# Patient Record
Sex: Female | Born: 1950 | Race: White | Hispanic: No | Smoking: Never smoker
Health system: Southern US, Community
[De-identification: ages and names within clinical notes are randomized; demographics above are authoritative.]

## PROBLEM LIST (undated history)

## (undated) DIAGNOSIS — E119 Type 2 diabetes mellitus without complications: Secondary | ICD-10-CM

## (undated) DIAGNOSIS — I10 Essential (primary) hypertension: Secondary | ICD-10-CM

## (undated) DIAGNOSIS — E785 Hyperlipidemia, unspecified: Secondary | ICD-10-CM

## (undated) DIAGNOSIS — F32A Depression, unspecified: Secondary | ICD-10-CM

## (undated) DIAGNOSIS — Z862 Personal history of diseases of the blood and blood-forming organs and certain disorders involving the immune mechanism: Secondary | ICD-10-CM

## (undated) DIAGNOSIS — R51 Headache: Secondary | ICD-10-CM

## (undated) DIAGNOSIS — F329 Major depressive disorder, single episode, unspecified: Secondary | ICD-10-CM

## (undated) DIAGNOSIS — Z8619 Personal history of other infectious and parasitic diseases: Secondary | ICD-10-CM

## (undated) HISTORY — PX: BREAST REDUCTION SURGERY: SHX8

## (undated) HISTORY — DX: Personal history of other infectious and parasitic diseases: Z86.19

## (undated) HISTORY — PX: REDUCTION MAMMAPLASTY: SUR839

## (undated) HISTORY — PX: BUNIONECTOMY: SHX129

## (undated) HISTORY — PX: BREAST BIOPSY: SHX20

## (undated) HISTORY — PX: ABDOMINAL HYSTERECTOMY: SHX81

## (undated) HISTORY — DX: Type 2 diabetes mellitus without complications: E11.9

## (undated) HISTORY — PX: OTHER SURGICAL HISTORY: SHX169

## (undated) HISTORY — DX: Hyperlipidemia, unspecified: E78.5

## (undated) HISTORY — DX: Essential (primary) hypertension: I10

---

## 1974-11-13 HISTORY — PX: ABDOMINAL HYSTERECTOMY: SUR658

## 2011-08-07 LAB — CBC AND DIFFERENTIAL: Hemoglobin: 13.9 g/dL (ref 12.0–16.0)

## 2012-06-26 LAB — LIPID PANEL
HDL: 33 mg/dL — AB (ref 35–70)
LDL Cholesterol: 73 mg/dL

## 2012-06-26 LAB — BASIC METABOLIC PANEL: Creatinine: 0.8 mg/dL (ref 0.5–1.1)

## 2013-01-07 ENCOUNTER — Ambulatory Visit (INDEPENDENT_AMBULATORY_CARE_PROVIDER_SITE_OTHER): Payer: Self-pay | Admitting: Family Medicine

## 2013-01-07 ENCOUNTER — Encounter: Payer: Self-pay | Admitting: Family Medicine

## 2013-01-07 VITALS — BP 176/91 | HR 68 | Ht 68.5 in | Wt 221.0 lb

## 2013-01-07 DIAGNOSIS — I10 Essential (primary) hypertension: Secondary | ICD-10-CM

## 2013-01-07 DIAGNOSIS — Z8619 Personal history of other infectious and parasitic diseases: Secondary | ICD-10-CM

## 2013-01-07 DIAGNOSIS — E782 Mixed hyperlipidemia: Secondary | ICD-10-CM | POA: Insufficient documentation

## 2013-01-07 DIAGNOSIS — Z853 Personal history of malignant neoplasm of breast: Secondary | ICD-10-CM | POA: Insufficient documentation

## 2013-01-07 DIAGNOSIS — E785 Hyperlipidemia, unspecified: Secondary | ICD-10-CM

## 2013-01-07 DIAGNOSIS — F329 Major depressive disorder, single episode, unspecified: Secondary | ICD-10-CM | POA: Insufficient documentation

## 2013-01-07 HISTORY — DX: Essential (primary) hypertension: I10

## 2013-01-07 HISTORY — DX: Personal history of other infectious and parasitic diseases: Z86.19

## 2013-01-07 NOTE — Progress Notes (Signed)
CC: Kayla Gallagher is a 62 y.o. female is here for Establish Care   Subjective: HPI:   very pleasant 62 year old here to establish care. Formally from [place].  History hyperlipidemia: She's currently taking Zocor 40 mg on a daily basis. She is unsure of her most recent lipid panel results, believes it's been over a year. Denies right upper quadrant pain, myalgias, skin or scleral discoloration. Tries to watch what she eats, is trying to get back in exercise program.    history of hepatitis C treated in the late 90s. She's uncertain what exactly she was treated with.  She's not sure if her viral load has been checked anytime recently. She occasionally takes acetaminophen, no longer a drinker, no recreational drug use currently but did use IV drugs in the 70s. She denies bleeding issues or easy bruisability.   History of breast cancer found during breast reduction surgery. She's uncertain of the specifics beyond this. She was told she has a spot on her right breast that has been followed every 6 months with diagnostic mammograms. He does not some and it has been biopsied. She believes she is a few months overdue for mammogram. She denies architectural changes in the breast, breast pain, nipple complaints, nor swollen lymph nodes. Denies unintentional weight loss. Family history significant for sister with breast cancer  History of hypertension: Tells me that this is been an issue for a little over one year. She's been on metoprolol and lisinopril and was once on hydrochlorothiazide but decided to stop taking this on her own. No outside blood pressures to report.  Review of Systems - General ROS: negative for - chills, fever, night sweats, weight gain or weight loss Ophthalmic ROS: negative for - decreased vision Psychological ROS: negative for -  depression ENT ROS: negative for - hearing change, nasal congestion, tinnitus or allergies Hematological and Lymphatic ROS: negative for - bleeding  problems, bruising or swollen lymph nodes Breast ROS: negative Respiratory ROS: no cough, shortness of breath, or wheezing Cardiovascular ROS: no chest pain or dyspnea on exertion Gastrointestinal ROS: no abdominal pain, change in bowel habits, or black or bloody stools Genito-Urinary ROS: negative for - genital discharge, genital ulcers, incontinence or abnormal bleeding from genitals Musculoskeletal ROS: negative for - joint pain or muscle pain Neurological ROS: negative for - headaches or memory loss Dermatological ROS: negative for lumps, mole changes, rash and skin lesion changes  Past Medical History  Diagnosis Date  . Hypertension   . Hyperlipidemia   . History of breast cancer 01/07/2013  . Essential hypertension, benign 01/07/2013  . History of hepatitis C 01/07/2013     Family History  Problem Relation Age of Onset  . Alcoholism      parents  . Breast cancer Sister   . Prostate cancer      grandfather  . Heart attack Father   . Diabetes Father     grandmother  . Hyperlipidemia      grandfather     History  Substance Use Topics  . Smoking status: Never Smoker   . Smokeless tobacco: Not on file  . Alcohol Use: No     Objective: Filed Vitals:   01/07/13 1607  BP: 176/91  Pulse: 68    General: Alert and Oriented, No Acute Distress HEENT: Pupils equal, round, reactive to light. Conjunctivae clear.  External ears unremarkable, canals clear with intact TMs with appropriate landmarks.  Middle ear appears open without effusion. Pink inferior turbinates.  Moist mucous membranes,  pharynx without inflammation nor lesions.  Neck supple without palpable lymphadenopathy nor abnormal masses. Lungs: Clear to auscultation bilaterally, no wheezing/ronchi/rales.  Comfortable work of breathing. Good air movement. Cardiac: Regular rate and rhythm. Normal S1/S2.  No murmurs, rubs, nor gallops.   Abdomen: Normal bowel sounds, soft and non tender without palpable masses. Obese,  nonpalpable liver Extremities: No peripheral edema.  Strong peripheral pulses.  Mental Status: No depression, anxiety, nor agitation. Skin: Warm and dry.  Assessment & Plan: Kayla Gallagher was seen today for establish care.  Diagnoses and associated orders for this visit:  History of breast cancer - CBC w/Diff - MM Digital Diagnostic Bilat; Future  Hyperlipidemia - Lipid panel  History of hepatitis C - COMPLETE METABOLIC PANEL WITH GFR  Essential hypertension, benign  History of breast cancer: Overdue for diagnostic mammogram, this has been ordered. Hyperlipidemia: Clinically controlled however is due for lipid panel, will adjust cholesterol medications if indicated hypertension: Uncontrolled,  she will try to cut down on salt intake and agrees to a titration of medications if continued elevated in 2 weeks at followup. History of hepatitis C: Clinically stable, Patient declines viral load investigation, agreeable to serial liver function studies.  45 minutes spent face-to-face during visit today of which at least 50% was counseling or coordinating care regarding history of breast cancer, history of hep C, hyperlipidemia, hypertension.    Return in about 2 weeks (around 01/21/2013).

## 2013-01-16 ENCOUNTER — Encounter: Payer: Self-pay | Admitting: Family Medicine

## 2013-01-16 DIAGNOSIS — Z1501 Genetic susceptibility to malignant neoplasm of breast: Secondary | ICD-10-CM | POA: Insufficient documentation

## 2013-01-16 DIAGNOSIS — Z1509 Genetic susceptibility to other malignant neoplasm: Secondary | ICD-10-CM

## 2013-01-16 LAB — COMPLETE METABOLIC PANEL WITH GFR
ALT: 17 U/L (ref 0–35)
AST: 17 U/L (ref 0–37)
Albumin: 4.4 g/dL (ref 3.5–5.2)
Alkaline Phosphatase: 68 U/L (ref 39–117)
BUN: 16 mg/dL (ref 6–23)
Calcium: 9.8 mg/dL (ref 8.4–10.5)
Chloride: 99 mEq/L (ref 96–112)
Potassium: 4.8 mEq/L (ref 3.5–5.3)
Sodium: 134 mEq/L — ABNORMAL LOW (ref 135–145)
Total Protein: 7.6 g/dL (ref 6.0–8.3)

## 2013-01-16 LAB — CBC WITH DIFFERENTIAL/PLATELET
Basophils Absolute: 0 10*3/uL (ref 0.0–0.1)
Eosinophils Absolute: 0 10*3/uL (ref 0.0–0.7)
Eosinophils Relative: 1 % (ref 0–5)
HCT: 38.3 % (ref 36.0–46.0)
Lymphocytes Relative: 27 % (ref 12–46)
MCH: 29.5 pg (ref 26.0–34.0)
MCHC: 34.2 g/dL (ref 30.0–36.0)
MCV: 86.3 fL (ref 78.0–100.0)
Monocytes Absolute: 0.6 10*3/uL (ref 0.1–1.0)
Platelets: 251 10*3/uL (ref 150–400)
RDW: 13.6 % (ref 11.5–15.5)

## 2013-01-16 LAB — LIPID PANEL
HDL: 29 mg/dL — ABNORMAL LOW (ref >39)
LDL Cholesterol: 87 mg/dL (ref 0–99)

## 2013-01-20 ENCOUNTER — Telehealth: Payer: Self-pay | Admitting: Family Medicine

## 2013-01-20 ENCOUNTER — Encounter: Payer: Self-pay | Admitting: Family Medicine

## 2013-01-20 DIAGNOSIS — R739 Hyperglycemia, unspecified: Secondary | ICD-10-CM

## 2013-01-20 NOTE — Telephone Encounter (Signed)
Left message on vm with results and to call back with questions. Will fax labs downstairs

## 2013-01-20 NOTE — Telephone Encounter (Signed)
Kayla Gallagher, Will you please let Kayla Gallagher know that her liver function and cholesterol looks great.  No need to make any adjstments to her medication regimen at this time.  Her blood sugar was elevated and I'd encourage her to have an A1c drawn sooner than later to look into the possibility of type 2 diabetes.  I've printed a lab ticket that she can take to the lab to have this done at her convienence (placed in you inbox).

## 2013-01-22 ENCOUNTER — Encounter: Payer: Self-pay | Admitting: Family Medicine

## 2013-01-22 ENCOUNTER — Encounter: Payer: Self-pay | Admitting: *Deleted

## 2013-01-22 DIAGNOSIS — I10 Essential (primary) hypertension: Secondary | ICD-10-CM

## 2013-01-22 DIAGNOSIS — K635 Polyp of colon: Secondary | ICD-10-CM | POA: Insufficient documentation

## 2013-02-05 ENCOUNTER — Telehealth: Payer: Self-pay | Admitting: Family Medicine

## 2013-02-05 DIAGNOSIS — Z853 Personal history of malignant neoplasm of breast: Secondary | ICD-10-CM

## 2013-02-05 NOTE — Telephone Encounter (Signed)
I spoke with pt and she states they have not contacted her to schedule u/s. She said they were trying to get records from last imaging she had done and they told her once they compared the reports/films they would let her know if she needed an u/s

## 2013-02-05 NOTE — Telephone Encounter (Signed)
Sue Lush, Can you please do me a favor and ask Mrs. Kayla Gallagher if anyone from [facility] has contacted her about a bilateral breast ultrasound.  I got a report saying the radiologist wanted this to better characterize the tissue in both breasts.  I want to make sure this gets done in case [facility] has not already scheduled it.

## 2013-02-05 NOTE — Telephone Encounter (Signed)
Bryn Gulling placed the [facility] contact info and report in your inbox.  Can you please call them with a verbal order for a bilateral breast ultrasound for diagnosis of bilateral breast masses with history of breast cancer.

## 2013-02-06 ENCOUNTER — Telehealth: Payer: Self-pay | Admitting: *Deleted

## 2013-02-06 ENCOUNTER — Encounter: Payer: BC Managed Care – PPO | Admitting: Family Medicine

## 2013-02-06 DIAGNOSIS — Z0289 Encounter for other administrative examinations: Secondary | ICD-10-CM

## 2013-02-06 NOTE — Telephone Encounter (Signed)
I have called and scheduled an U/S for this pt at [facility] for 4/1 @ 845. I left a detailed message on pt's vm.will fax order to [phone]

## 2013-02-20 LAB — HEMOGLOBIN A1C
Hgb A1c MFr Bld: 8 % — ABNORMAL HIGH (ref <5.7)
Mean Plasma Glucose: 183 mg/dL — ABNORMAL HIGH (ref <117)

## 2013-02-21 ENCOUNTER — Telehealth: Payer: Self-pay | Admitting: Family Medicine

## 2013-02-21 DIAGNOSIS — E119 Type 2 diabetes mellitus without complications: Secondary | ICD-10-CM | POA: Insufficient documentation

## 2013-02-21 DIAGNOSIS — Z853 Personal history of malignant neoplasm of breast: Secondary | ICD-10-CM

## 2013-02-21 DIAGNOSIS — N6310 Unspecified lump in the right breast, unspecified quadrant: Secondary | ICD-10-CM | POA: Insufficient documentation

## 2013-02-21 NOTE — Telephone Encounter (Signed)
Pt notified of results and pt does have an appt already on mon for U/S of rt breast

## 2013-02-21 NOTE — Telephone Encounter (Addendum)
Sue Lush, Will you please let Kayla Gallagher know that her average sugar level over the past three months was in the type 2 diabetic range.  I'd like her to follow up with me to discuss medication options and lifestyle recommendations to help better control her blood sugar. Also, she may already know this, but the ultrasound radiologist has recommended that she have an ultrasound guided biopsy of the right breast.  I'm curious to see if they have arranged this for her, I'm going to assume that they have not therefore I will place a referral for this.  Let me know if this is already arranged and I'll hold off.

## 2013-02-21 NOTE — Telephone Encounter (Addendum)
Left message to call back  

## 2013-02-24 ENCOUNTER — Encounter: Payer: Self-pay | Admitting: *Deleted

## 2013-03-04 ENCOUNTER — Encounter: Payer: Self-pay | Admitting: Family Medicine

## 2013-03-04 ENCOUNTER — Encounter: Payer: BC Managed Care – PPO | Admitting: Family Medicine

## 2013-03-11 DIAGNOSIS — Z0289 Encounter for other administrative examinations: Secondary | ICD-10-CM

## 2013-03-12 ENCOUNTER — Ambulatory Visit: Payer: BC Managed Care – PPO | Admitting: Family Medicine

## 2013-03-13 ENCOUNTER — Telehealth: Payer: Self-pay | Admitting: Family Medicine

## 2013-03-13 DIAGNOSIS — Z853 Personal history of malignant neoplasm of breast: Secondary | ICD-10-CM

## 2013-03-13 NOTE — Telephone Encounter (Signed)
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 Left message on voicemail that I was hoping to ensure she was getting followupand feedback from the breast center, encouraged her to call us back at her convenience with updates

## 2013-03-14 ENCOUNTER — Telehealth: Payer: Self-pay | Admitting: Family Medicine

## 2013-03-14 DIAGNOSIS — Z853 Personal history of malignant neoplasm of breast: Secondary | ICD-10-CM

## 2013-03-14 DIAGNOSIS — E119 Type 2 diabetes mellitus without complications: Secondary | ICD-10-CM

## 2013-03-14 MED ORDER — METFORMIN HCL 1000 MG PO TABS: 1000.0000 mg | ORAL_TABLET | Freq: Every day | ORAL | Status: DC

## 2013-03-14 NOTE — Telephone Encounter (Signed)
Patient confirms that she was contacted about benign biopsy, needing annual mammos.  Also will start metformin daily, readdress at [facility] in coming week.

## 2013-03-19 ENCOUNTER — Encounter: Payer: Self-pay | Admitting: Family Medicine

## 2013-03-25 ENCOUNTER — Encounter: Payer: BC Managed Care – PPO | Admitting: Family Medicine

## 2013-03-28 ENCOUNTER — Encounter: Payer: Self-pay | Admitting: Family Medicine

## 2013-04-04 ENCOUNTER — Telehealth: Payer: Self-pay | Admitting: Family Medicine

## 2013-04-04 MED ORDER — EST ESTROGENS-METHYLTEST 1.25-2.5 MG PO TABS: 1.0000 | ORAL_TABLET | Freq: Every day | ORAL | Status: DC

## 2013-04-04 NOTE — Telephone Encounter (Signed)
Refill request

## 2013-04-16 ENCOUNTER — Ambulatory Visit (INDEPENDENT_AMBULATORY_CARE_PROVIDER_SITE_OTHER): Payer: BC Managed Care – PPO | Admitting: Family Medicine

## 2013-04-16 ENCOUNTER — Encounter: Payer: Self-pay | Admitting: Family Medicine

## 2013-04-16 VITALS — BP 144/79 | HR 67 | Ht 68.5 in | Wt 217.0 lb

## 2013-04-16 DIAGNOSIS — Z0000 Encounter for general adult medical examination without abnormal findings: Secondary | ICD-10-CM

## 2013-04-16 DIAGNOSIS — E119 Type 2 diabetes mellitus without complications: Secondary | ICD-10-CM

## 2013-04-16 DIAGNOSIS — L570 Actinic keratosis: Secondary | ICD-10-CM | POA: Insufficient documentation

## 2013-04-16 LAB — BASIC METABOLIC PANEL WITH GFR
CO2: 27 mEq/L (ref 19–32)
Chloride: 100 mEq/L (ref 96–112)
Creat: 0.84 mg/dL (ref 0.50–1.10)
Potassium: 4.7 mEq/L (ref 3.5–5.3)

## 2013-04-16 NOTE — Progress Notes (Signed)
CC: Kayla Gallagher is a 62 y.o. female is here for Annual Exam   Subjective: HPI:  Colonoscopy: 09/2011 Repeat 2015 Papsmear: 03/2012 Hx hysterectomy for only endometriosis and normal last pap, no longer needed.  Mammogram: Spring 2014: repeat October 2014   DEXA: History of normal BMD will repeat age 63  Influenza Vaccine: out of season Pneumovax: patient declines despite discussion of benefits Td/Tdap: patient declines but is due, despite discussion of benefits Zoster: patient declines despite discussion about the  Patient has no acute complaints today  Over the past 2 weeks have you been bothered by: - Little interest or pleasure in doing things: no - Feeling down depressed or hopeless: no  She is starting to work out more and watch what she eats ever since diagnosis of type 2 diabetes, successful intentional weight loss 2-4 pounds since I saw her last  Denies alcohol, tobacco, nor recreational drug use  Review of Systems - General ROS: negative for - chills, fever, night sweats, weight gain or weight loss Ophthalmic ROS: negative for - decreased vision Psychological ROS: negative for - anxiety or depression ENT ROS: negative for - hearing change, nasal congestion, tinnitus or allergies Hematological and Lymphatic ROS: negative for - bleeding problems, bruising or swollen lymph nodes Breast ROS: negative Respiratory ROS: no cough, shortness of breath, or wheezing Cardiovascular ROS: no chest pain or dyspnea on exertion Gastrointestinal ROS: no abdominal pain, change in bowel habits, or black or bloody stools Genito-Urinary ROS: negative for - genital discharge, genital ulcers, incontinence or abnormal bleeding from genitals Musculoskeletal ROS: negative for - joint pain or muscle pain Neurological ROS: negative for - headaches or memory loss Dermatological ROS: negative for lumps, mole changes, rash and skin lesion changes  Past Medical History  Diagnosis Date  .  Hypertension   . Hyperlipidemia   . History of breast cancer 01/07/2013  . Essential hypertension, benign 01/07/2013  . History of hepatitis C 01/07/2013     Family History  Problem Relation Age of Onset  . Alcoholism      parents  . Breast cancer Sister   . Prostate cancer      grandfather  . Heart attack Father   . Diabetes Father     grandmother  . Hyperlipidemia      grandfather     History  Substance Use Topics  . Smoking status: Never Smoker   . Smokeless tobacco: Not on file  . Alcohol Use: No     Objective: Filed Vitals:   04/16/13 0833  BP: 144/79  Pulse: 67   General: No Acute Distress HEENT: Atraumatic, normocephalic, conjunctivae normal without scleral icterus.  No nasal discharge, hearing grossly intact, TMs with good landmarks bilaterally with no middle ear abnormalities, posterior pharynx clear without oral lesions. Neck: Supple, trachea midline, no cervical nor supraclavicular adenopathy. Pulmonary: Clear to auscultation bilaterally without wheezing, rhonchi, nor rales. Cardiac: Regular rate and rhythm.  No murmurs, rubs, nor gallops. No peripheral edema.  2+ peripheral pulses bilaterally. Abdomen: Bowel sounds normal.  No masses.  Non-tender without rebound.  Negative Murphy's sign. GU: Patient declines  MSK: Grossly intact, no signs of weakness.  Full strength throughout upper and lower extremities.  Full ROM in upper and lower extremities.  No midline spinal tenderness. Neuro: Gait unremarkable, CN II-XII grossly intact.  C5-C6 Reflex 2/4 Bilaterally, L4 Reflex 2/4 Bilaterally.  Cerebellar function intact. Skin: She has an irritated actinic keratosis on the right posterior scalp line as well as at the  centimeter actinic keratosis on the right sternal border, seborrheic keratosis on left arm not inflamed Psych: Alert and oriented to person/place/time.  Thought process normal. No anxiety/depression.   Assessment & Plan: Kayla Gallagher was seen today for annual  exam.  Diagnoses and associated orders for this visit:  Physical exam  Type 2 diabetes mellitus - BASIC METABOLIC PANEL WITH GFR  Actinic keratosis    Healthy lifestyle interventions including but limited to regular exercise, a healthy low fat diet, moderation of salt intake, the dangers of tobacco/alcohol/recreational drug use, nutrition supplementation, and accident avoidance were discussed with the patient and a handout was provided for future reference.  Type 2 diabetes: Will check fasting blood sugar today an A1c in 2 months, LDL is at goal already on Zocor. She's taking an ACE inhibitor I've asked her to return at her convenience or she can wait 2 diabetes followup to have cryotherapy to 2 actinic keratosis  Return in about 4 weeks (around 05/14/2013).

## 2013-04-16 NOTE — Patient Instructions (Signed)
Actinic Keratosis Actinic keratosis is a precancerous growth on the skin. This means it could develop into skin cancer if it is not treated. About 1% of actinic keratoses turn into skin cancer within a year. It is important to have all such growths removed to prevent them from developing into skin cancer. CAUSES  Actinic keratosis is caused by getting too much ultraviolet (UV) radiation from the sun or other UV light sources. RISK FACTORS Factors that increase your chances of getting actinic keratosis include:  Having light-colored skin and blue eyes.  Having blonde or red hair.  Spending a lot of time in the sun.  Age. The risk of actinic keratosis increases with age. SYMPTOMS  Actinic keratosis growths look like scaly, rough spots of skin. They can be as small as a pinhead or as big as a quarter. They may itch, hurt, or feel sensitive. Sometimes there is a little tag of pink or gray skin growing off them. In some cases, actinic keratoses are easier felt than seen. They do not go away with the use of moisturizing lotions or creams. Actinic keratoses appear most often on areas of skin that get a lot of sun exposure. These areas include the:  Scalp.  Face.  Ears.  Lips.  Upper back.  Backs of the hands.  Forearms. DIAGNOSIS  Your caregiver can usually tell what is wrong by performing a physical exam. A tissue sample (biopsy) may also be taken and examined under a microscope. TREATMENT  Actinic keratosis can be treated several ways. Most treatments can be done in your caregiver's office. Treatment options may include:  Curettage. A tool is used to gently scrape off the growth.  Cryosurgery. Liquid nitrogen is applied to the growth to freeze it. The growth eventually falls off the skin.  Medicated creams, such as 5-fluorouracil or imiquimod. The medicine destroys the cells in the growth.  Chemical peels. Chemicals are applied to the growth and the outer layers of skin are peeled  off.  Photodynamic therapy. A drug that makes your skin more sensitive to light is applied to the skin. A strong, blue light is aimed at the skin and destroys the growth. PREVENTION  To prevent future sun damage:  Try to avoid the sun between 10:00 a.m. and 4:00 p.m. when it is the strongest.  Use a sunscreen or sunblock with SPF 30 or greater.  Apply sunscreen at least 30 minutes before exposure to the sun.  Always wear protective hats, clothing, and sunglasses with UV protection.  Avoid medicines, herbs, and foods that increase your sensitivity to sunlight.  Avoid tanning beds. HOME CARE INSTRUCTIONS   If your skin was covered with a bandage, change and remove the bandage as directed by your caregiver.  Keep the treated area dry as directed by your caregiver.  Apply any creams as prescribed by your caregiver. Follow the directions carefully.  Check your skin regularly for any changes.  Visit a skin doctor (dermatologist) every year for a skin exam. SEEK MEDICAL CARE IF:   Your skin does not heal and becomes irritated, red, or bleeds.  You notice any changes or new growths on your skin. Document Released: 01/26/2009 Document Revised: 01/22/2012 Document Reviewed: 12/11/2011 ExitCare Patient Information 2014 ExitCare  Dr. Genelle Bal General Advice Following Your Complete Physical Exam  The Benefits of Regular Exercise: Unless you suffer from an uncontrolled cardiovascular condition, studies strongly suggest that regular exercise and physical activity will add to both the quality and length of your life.  The World Health Organization recommends 150 minutes of moderate intensity aerobic activity every week.  This is best split over 3-4 days a week, and can be as simple as a brisk walk for just over 35 minutes "most days of the week".  This type of exercise has been shown to lower LDL-Cholesterol, lower average blood sugars, lower blood pressure, lower cardiovascular disease risk,  improve memory, and increase one's overall sense of wellbeing.  The addition of anaerobic (or "strength training") exercises offers additional benefits including but not limited to increased metabolism, prevention of osteoporosis, and improved overall cholesterol levels.  How Can I Strive For A Low-Fat Diet?: Current guidelines recommend that 25-35 percent of your daily energy (food) intake should come from fats.  One might ask how can this be achieved without having to dissect each meal on a daily basis?  Switch to skim or 1% milk instead of whole milk.  Focus on lean meats such as ground [place], fresh fish, baked chicken, and lean cuts of beef as your source of dietary protein.  Consume less than 300mg /day of dietary cholesterol.  Limit trans fatty acid consumption primarily by limiting synthetic trans fats such as partially hydrogenated oils (Ex: fried fast foods).  Focus efforts on reducing your intake of "solid" fats (Ex: Butter).  Substitute olive or vegetable oil for solid fats where possible.  Moderation of Salt Intake: Provided you don't carry a diagnosis of congestive heart failure nor renal failure, I recommend a daily allowance of no more than 2300 mg of salt (sodium).  Keeping under this daily goal is associated with a decreased risk of cardiovascular events, creeping above it can lead to elevated blood pressures and increases your risk of cardiovascular events.  Milligrams (mg) of salt is listed on all nutrition labels, and your daily intake can add up faster than you think.  Most canned and frozen dinners can pack in over half your daily salt allowance in one meal.    Lifestyle Health Risks: Certain lifestyle choices carry specific health risks.  As you may already know, tobacco use has been associated with increasing one's risk of cardiovascular disease, pulmonary disease, numerous cancers, among many other issues.  What you may not know is that there are medications and nicotine  replacement strategies that can more than double your chances of successfully quitting.  I would be thrilled to help manage your quitting strategy if you currently use tobacco products.  When it comes to alcohol use, I've yet to find an "ideal" daily allowance.  Provided an individual does not have a medical condition that is exacerbated by alcohol consumption, general guidelines determine "safe drinking" as no more than two standard drinks for a man or no more than one standard drink for a female per day.  However, much debate still exists on whether any amount of alcohol consumption is technically "safe".  My general advice, keep alcohol consumption to a minimum for general health promotion.  If you or others believe that alcohol, tobacco, or recreational drug use is interfering with your life, I would be happy to provide confidential counseling regarding treatment options.  General "Over The Counter" Nutrition Advice: Postmenopausal women should aim for a daily calcium intake of 1200 mg, however a significant portion of this might already be provided by diets including milk, yogurt, cheese, and other dairy products.  Vitamin D has been shown to help preserve bone density, prevent fatigue, and has even been shown to help reduce falls in the elderly.  Ensuring a daily intake of 800 Units of Vitamin D is a good place to start to enjoy the above benefits, we can easily check your Vitamin D level to see if you'd potentially benefit from supplementation beyond 800 Units a day.  Folic Acid intake should be of particular concern to women of childbearing age.  Daily consumption of 400-800 mcg of Folic Acid is recommended to minimize the chance of spinal cord defects in a fetus should pregnancy occur.    For many adults, accidents still remain one of the most common culprits when it comes to cause of death.  Some of the simplest but most effective preventitive habits you can adopt include regular seatbelt use, proper  helmet use, securing firearms, and regularly testing your smoke and carbon monoxide detectors.  Karaline Buresh B. [facility] DO Med [facility] [address], [address] [place], [place] [address] Phone: [phone]

## 2013-04-17 ENCOUNTER — Telehealth: Payer: Self-pay | Admitting: Family Medicine

## 2013-04-17 DIAGNOSIS — E119 Type 2 diabetes mellitus without complications: Secondary | ICD-10-CM

## 2013-04-17 MED ORDER — METFORMIN HCL 1000 MG PO TABS: 1000.0000 mg | ORAL_TABLET | Freq: Two times a day (BID) | ORAL | Status: DC

## 2013-04-17 NOTE — Telephone Encounter (Signed)
Sue Lush, Will you please let mrs. Aitken know that her fasting blood sugar has not budged one bit since checking three months ago.  I'd like her to increase her metformin to 1000mg  twice a day, she can consider breaking the tablet in half at the time of her new dosing for a few days before going to the full tablet to avoid loose stools.  We'll recheck this in a month.

## 2013-04-18 NOTE — Telephone Encounter (Signed)
Left message for patient to call back  

## 2013-04-21 NOTE — Telephone Encounter (Signed)
Patient advised.

## 2013-05-27 ENCOUNTER — Encounter: Payer: Self-pay | Admitting: Family Medicine

## 2013-05-27 ENCOUNTER — Ambulatory Visit (INDEPENDENT_AMBULATORY_CARE_PROVIDER_SITE_OTHER): Payer: BC Managed Care – PPO | Admitting: Family Medicine

## 2013-05-27 VITALS — BP 161/95 | HR 87 | Temp 98.0°F | Wt 218.0 lb

## 2013-05-27 DIAGNOSIS — I10 Essential (primary) hypertension: Secondary | ICD-10-CM

## 2013-05-27 DIAGNOSIS — L0390 Cellulitis, unspecified: Secondary | ICD-10-CM

## 2013-05-27 DIAGNOSIS — E119 Type 2 diabetes mellitus without complications: Secondary | ICD-10-CM

## 2013-05-27 LAB — HEMOGLOBIN A1C
Hgb A1c MFr Bld: 7.4 % — ABNORMAL HIGH (ref <5.7)
Mean Plasma Glucose: 166 mg/dL — ABNORMAL HIGH (ref <117)

## 2013-05-27 MED ORDER — SULFAMETHOXAZOLE-TRIMETHOPRIM 800-160 MG PO TABS: ORAL_TABLET | ORAL | Status: AC

## 2013-05-27 MED ORDER — METOPROLOL SUCCINATE ER 100 MG PO TB24: 100.0000 mg | ORAL_TABLET | Freq: Every day | ORAL | Status: DC

## 2013-05-27 NOTE — Progress Notes (Signed)
CC: Kayla Gallagher is a 62 y.o. female is here for Cellulitis   Subjective: HPI:  Patient complains of a rash on the chin that began Sunday night it is enlarging ever since onset, slowly. Nothing seems to make it better or worse she has started leftover Keflex 500 mg 3 times a day starting Sunday night did notice a new lesion this morning. Lesions are slightly tender discharge is scant described as clear. She denies skin lesions elsewhere. Denies fevers, chills, local trauma, dysphagia, sore throat, swollen lymph nodes, rapid heartbeat nor shortness of breath.  Followup essential hypertension: She tells me she has run out of metoprolol has not been using it for the last 2 weeks. Before and since denies chest pain, limb claudication, headache, motor sensory disturbances.  Followup type 2 diabetes: Since her last visit she is on full dose metformin without missed doses. She denies polyuria polyphagia polydipsia she does have a poorly healing wound on her chin but denies skin rashes elsewhere lesions nor burning or tingling of extremities denies vision loss.     Review Of Systems Outlined In HPI  Past Medical History  Diagnosis Date  . Hypertension   . Hyperlipidemia   . History of breast cancer 01/07/2013  . Essential hypertension, benign 01/07/2013  . History of hepatitis C 01/07/2013     Family History  Problem Relation Age of Onset  . Alcoholism      parents  . Breast cancer Sister   . Prostate cancer      grandfather  . Heart attack Father   . Diabetes Father     grandmother  . Hyperlipidemia      grandfather     History  Substance Use Topics  . Smoking status: Never Smoker   . Smokeless tobacco: Not on file  . Alcohol Use: No     Objective: Filed Vitals:   05/27/13 0822  BP: 161/95  Pulse: 87  Temp: 98 F (36.7 C)    General: Alert and Oriented, No Acute Distress HEENT: Pupils equal, round, reactive to light. Conjunctivae clear.  Pink inferior turbinates.  Moist  mucous membranes, pharynx without inflammation nor lesions.  Neck supple  with shotty right anterior cervical chain lymphadenopathy nor abnormal masses. the chin has 3 shallow abrasions with mild surrounding erythema  each of which are approximately 1-1/2 cm diameter  Lungs: Clear to auscultation bilaterally, no wheezing/ronchi/rales.  Comfortable work of breathing. Good air movement. Cardiac: Regular rate and rhythm. Normal S1/S2.  No murmurs, rubs, nor gallops.   Extremities: No peripheral edema.  Strong peripheral pulses.  Mental Status: No depression, anxiety, nor agitation. Skin: Warm and dry.  Assessment & Plan: Kayla Gallagher was seen today for cellulitis.  Diagnoses and associated orders for this visit:  Type 2 diabetes mellitus - Hemoglobin A1c  Essential hypertension, benign - metoprolol succinate (TOPROL-XL) 100 MG 24 hr tablet; Take 1 tablet (100 mg total) by mouth daily. Take with or immediately following a meal.  Cellulitis - sulfamethoxazole-trimethoprim (SEPTRA DS) 800-160 MG per tablet; One by mouth twice a day for ten days.    Type 2 diabetes: She is due for an A1c to determine control Essential hypertension: Chronic uncontrolled condition exacerbated due to stopping metoprolol, she is in agreement of restarting metoprolol Cellulits: suspect staph vs strep infection therefore start septra.   Return in about 3 months (around 08/27/2013). or if symptoms fail to improve.

## 2013-06-09 ENCOUNTER — Telehealth: Payer: Self-pay | Admitting: *Deleted

## 2013-06-09 DIAGNOSIS — E559 Vitamin D deficiency, unspecified: Secondary | ICD-10-CM

## 2013-06-09 NOTE — Telephone Encounter (Signed)
Pt calls and states she has had low vitamin d levels in the past and would like this checked again as she is starting to feel the same way as before when it was low. Barry Dienes, LPN

## 2013-06-09 NOTE — Telephone Encounter (Signed)
Ok for vitamin D recheck. Send slip to lab.

## 2013-06-10 NOTE — Telephone Encounter (Signed)
Pt notified lab slip sent to lab. Barry Dienes, LPN

## 2013-06-11 LAB — VITAMIN D 25 HYDROXY (VIT D DEFICIENCY, FRACTURES): Vit D, 25-Hydroxy: 27 ng/mL — ABNORMAL LOW (ref 30–89)

## 2013-06-17 ENCOUNTER — Encounter: Payer: Self-pay | Admitting: Family Medicine

## 2013-06-17 ENCOUNTER — Ambulatory Visit (INDEPENDENT_AMBULATORY_CARE_PROVIDER_SITE_OTHER): Payer: BC Managed Care – PPO | Admitting: Family Medicine

## 2013-06-17 VITALS — BP 164/82 | HR 72 | Temp 98.1°F | Wt 218.0 lb

## 2013-06-17 DIAGNOSIS — R5383 Other fatigue: Secondary | ICD-10-CM

## 2013-06-17 DIAGNOSIS — R5381 Other malaise: Secondary | ICD-10-CM

## 2013-06-17 DIAGNOSIS — R51 Headache: Secondary | ICD-10-CM

## 2013-06-17 LAB — COMPLETE METABOLIC PANEL WITH GFR
ALT: 16 U/L (ref 0–35)
AST: 18 U/L (ref 0–37)
Albumin: 4.2 g/dL (ref 3.5–5.2)
Calcium: 9.8 mg/dL (ref 8.4–10.5)
Chloride: 100 mEq/L (ref 96–112)
Potassium: 5 mEq/L (ref 3.5–5.3)

## 2013-06-17 LAB — CBC
HCT: 38.4 % (ref 36.0–46.0)
Platelets: 240 10*3/uL (ref 150–400)
RBC: 4.45 MIL/uL (ref 3.87–5.11)
RDW: 13.7 % (ref 11.5–15.5)
WBC: 8.3 10*3/uL (ref 4.0–10.5)

## 2013-06-17 LAB — SEDIMENTATION RATE: Sed Rate: 27 mm/hr — ABNORMAL HIGH (ref 0–22)

## 2013-06-17 LAB — C-REACTIVE PROTEIN: CRP: 1.1 mg/dL — ABNORMAL HIGH (ref <0.60)

## 2013-06-17 LAB — VITAMIN B12: Vitamin B-12: 274 pg/mL (ref 211–911)

## 2013-06-17 NOTE — Progress Notes (Signed)
CC: Kayla Gallagher is a 62 y.o. female is here for Dizziness   Subjective: HPI:  Patient complains of fatigue and headache: This is been present for 2-3 weeks present on a daily basis fatigue is described as debilitating slightly improved with sleeping more than 10 hours describes feel asleep anytime of the day that she wants describes nonrestorative sleep. She's unsure about snoring. Symptoms worsening since onset. She admits to muscular fatigue in shoulders nowhere else.  Her headache is described as a pressure on the right forehead radiating to the right temple, moderate in severity comes and goes throughout the day slightly improved while on Bactrim for cellulitis nothing else makes better or worse overall Slowly worsening since onset 3 weeks ago. Denies motor or sensory disturbances, photophobia, phonophobia, rashes, neck pain, confusion, nausea. She is unable to describe character beyond above  Denies fevers, chills, chest pain, irregular heartbeat, shortness of breath   Review Of Systems Outlined In HPI  Past Medical History  Diagnosis Date  . Hypertension   . Hyperlipidemia   . History of breast cancer 01/07/2013  . Essential hypertension, benign 01/07/2013  . History of hepatitis C 01/07/2013     Family History  Problem Relation Age of Onset  . Alcoholism      parents  . Breast cancer Sister   . Prostate cancer      grandfather  . Heart attack Father   . Diabetes Father     grandmother  . Hyperlipidemia      grandfather     History  Substance Use Topics  . Smoking status: Never Smoker   . Smokeless tobacco: Not on file  . Alcohol Use: No     Objective: Filed Vitals:   06/17/13 0950  BP: 164/82  Pulse: 72  Temp: 98.1 F (36.7 C)    General: Alert and Oriented, No Acute Distress HEENT: Pupils equal, round, reactive to light. Conjunctivae clear.  External ears unremarkable, canals clear with intact TMs with appropriate landmarks.  Middle ear appears open without  effusion. Pink inferior turbinates.  Moist mucous membranes, pharynx without inflammation nor lesions.  Neck supple without palpable lymphadenopathy nor abnormal masses. Cranial nerves II through XII grossly intact Lungs: Clear to auscultation bilaterally, no wheezing/ronchi/rales.  Comfortable work of breathing. Good air movement. Cardiac: Regular rate and rhythm. Normal S1/S2.  No murmurs, rubs, nor gallops.  No carotid bruits Extremities: No peripheral edema.  Strong peripheral pulses. Full range of motion and strength of bilateral upper extremities Mental Status: No depression, anxiety, nor agitation. Skin: Warm and dry.  Assessment & Plan: Kayla Gallagher was seen today for dizziness.  Diagnoses and associated orders for this visit:  Fatigue - TSH - B12 - CBC - COMPLETE METABOLIC PANEL WITH GFR  Headache(784.0) - Sed Rate (ESR) - C-reactive protein    Fatigue: Will rule out reversible causes and metabolic abnormalities above, if normal would like to consider sleep study versus CT sinuses Headache: Will rule out temporal arteritis we'll consider above sleep study versus CT sinus  Return if symptoms worsen or fail to improve.

## 2013-06-18 ENCOUNTER — Telehealth: Payer: Self-pay | Admitting: Family Medicine

## 2013-06-18 DIAGNOSIS — M316 Other giant cell arteritis: Secondary | ICD-10-CM | POA: Insufficient documentation

## 2013-06-18 DIAGNOSIS — R51 Headache: Secondary | ICD-10-CM

## 2013-06-18 MED ORDER — PREDNISONE 20 MG PO TABS: ORAL_TABLET | ORAL | Status: DC

## 2013-06-18 NOTE — Telephone Encounter (Signed)
Pt.notified

## 2013-06-18 NOTE — Telephone Encounter (Signed)
Sue Lush, Will you please let [name] know that her lab work raises my suspicion for temporal arteritis, I'd like her to start on 40mg  of prednisone daily which I've sent to her [facility].  I've also put in vascular specialist referral to help rule in or rule out this diagnosis and duration of prednisone, fortunately if her headache is just from her sinuses this should also help.  I'd encourage her to f/u with me in 2 weeks.

## 2013-06-24 ENCOUNTER — Encounter (HOSPITAL_COMMUNITY): Payer: Self-pay | Admitting: Pharmacy Technician

## 2013-06-24 ENCOUNTER — Ambulatory Visit (INDEPENDENT_AMBULATORY_CARE_PROVIDER_SITE_OTHER): Payer: BC Managed Care – PPO | Admitting: Vascular Surgery

## 2013-06-24 ENCOUNTER — Encounter: Payer: Self-pay | Admitting: Vascular Surgery

## 2013-06-24 ENCOUNTER — Other Ambulatory Visit: Payer: Self-pay | Admitting: *Deleted

## 2013-06-24 VITALS — BP 163/82 | HR 66 | Ht 68.5 in | Wt 220.8 lb

## 2013-06-24 DIAGNOSIS — M316 Other giant cell arteritis: Secondary | ICD-10-CM

## 2013-06-24 NOTE — Progress Notes (Signed)
[facility] [place] HISTORY AND PHYSICAL   CC:  Possible temoral arteritis  Hommel, Sean, DO  HPI: This is a 62 y.o. female who is referred from her PCP for possible temporal arteritis in need of temporal artery bx.  She states that she has had dizziness that has been going on for about 4-5 weeks and she was started on prednisone and this did improve somewhat, but was still dizzy in the morning.  She has complained of HA, but this also improved with the steroids.  She states thate she has had excessive sweating with little exertion as well.  She says that she had some abnormal labs and her PCP office called and wanted her referred for bx.   She did have a failed stress test in 2013 followed by cardiac catheterization, which she states was normal.  She also has a hx of diabetes that is treated with oral medication.  She also has hypertension treated with anti hypertensives.  Past Medical History  Diagnosis Date  . Hypertension   . Hyperlipidemia   . History of breast cancer 01/07/2013  . Essential hypertension, benign 01/07/2013  . History of hepatitis C 01/07/2013  . Diabetes mellitus without complication    Past Surgical History  Procedure Laterality Date  . Abdominal hysterectomy  1976  . Breast reduction surgery    . Abdominal hysterectomy    . Tummy tuck      No Known Allergies  Current Outpatient Prescriptions  Medication Sig Dispense Refill  . estrogen-methylTESTOSTERone (ESTRATEST) 1.25-2.5 MG per tablet Take 1 tablet by mouth daily.  90 tablet  11  . lisinopril (PRINIVIL,ZESTRIL) 40 MG tablet Take 40 mg by mouth daily.      . metFORMIN (GLUCOPHAGE) 1000 MG tablet Take 1 tablet (1,000 mg total) by mouth 2 (two) times daily.  60 tablet  3  . metoprolol succinate (TOPROL-XL) 100 MG 24 hr tablet Take 1 tablet (100 mg total) by mouth daily. Take with or immediately following a meal.  90 tablet  3  . PARoxetine (PAXIL) 20 MG tablet Take 20 mg by mouth every  morning.      . predniSONE (DELTASONE) 20 MG tablet Two by mouth every morning.  60 tablet  0  . simvastatin (ZOCOR) 40 MG tablet Take 40 mg by mouth every evening.       No current facility-administered medications for this visit.    Family History  Problem Relation Age of Onset  . Alcoholism      parents  . Breast cancer Sister   . Prostate cancer      grandfather  . Heart attack Father   . Diabetes Father     grandmother  . Cancer Father   . Heart disease Father   . Hyperlipidemia Father   . Hypertension Father   . Hyperlipidemia      grandfather  . Cancer Mother     History   Social History  . Marital Status: Divorced    Spouse Name: N/A    Number of Children: N/A  . Years of Education: N/A   Occupational History  . Not on file.   Social History Main Topics  . Smoking status: Never Smoker   . Smokeless tobacco: Not on file  . Alcohol Use: No  . Drug Use: No  . Sexually Active: Not Currently   Other Topics Concern  . Not on file   Social History Narrative  . No narrative on file  ROS: [x]  Positive   [ ]  Negative   [ ]  All sytems reviewed and are negative  Cardiovascular: []  chest pain/pressure []  palpitations []  SOB lying flat []  DOE []  pain in legs while walking []  pain in feet when lying flat []  hx of DVT []  hx of phlebitis []  swelling in legs []  varicose veins  Pulmonary: []  productive cough []  asthma []  wheezing  Neurologic: []  weakness in []  arms []  legs []  numbness in []  arms []  legs [] difficulty speaking or slurred speech []  temporary loss of vision in one eye [x]  dizziness-see HPI [x]  headache-see HPI  Hematologic: []  bleeding problems []  problems with blood clotting easily  GI []  vomiting blood []  blood in stool  GU: []  burning with urination []  blood in urine  Psychiatric: [x]  hx of major depression-clinical.  She has moved, changed jobs and lost her dad in the past 3 months.  Integumentary: []  rashes []   ulcers  Constitutional: []  fever []  chills   PHYSICAL EXAMINATION:  Filed Vitals:   06/24/13 1511  BP: 163/82  Pulse: 66   Body mass index is 33.08 kg/(m^2).  General:  WDWN in NAD Gait: Not observed HENT: WNL, normocephalic Eyes: Pupils equal Pulmonary: normal non-labored breathing , without Rales, rhonchi,  wheezing Cardiac: RRR, without  Murmurs, rubs or gallops; without carotid bruits Abdomen: soft, NT, no masses Skin: without rashes, without ulcers  Vascular Exam/Pulses:+ palpable radial pulses bilaterally Extremities: without ischemic changes, without Gangrene , without cellulitis; without open wounds;  Musculoskeletal: no muscle wasting or atrophy  Neurologic: A&O X 3; Appropriate Affect ; SENSATION: normal; MOTOR FUNCTION:  moving all extremities equally. Speech is fluent/normal   Non-Invasive Vascular Imaging:  none  Sed rate 27 CRP 1.1  ASSESSMENT/PLAN: 62 y.o. female with possible temporal arteritis with slightly increased sed rate and CRP  -for right temporal artery biopsy June 30, 2013    [facility] [phone]  Clinic MD:  Pt seen and examined in conjunction with Dr. Arbie Cookey  I have examined the patient, reviewed and agree with above. I discussed our role in temporal artery biopsy for better ability to diagnose temporal arteritis. Explain the procedure including an outpatient procedure with local with IV sedation. We'll proceed with this on 8/18 at her convenience  EARLY, TODD, MD 06/24/2013 5:14 PM

## 2013-06-27 ENCOUNTER — Encounter (HOSPITAL_COMMUNITY): Payer: Self-pay | Admitting: *Deleted

## 2013-06-27 NOTE — Progress Notes (Signed)
06/27/13 1409  OBSTRUCTIVE SLEEP APNEA  Have you ever been diagnosed with sleep apnea through a sleep study? No  Do you snore loudly (loud enough to be heard through closed doors)?  1  Do you often feel tired, fatigued, or sleepy during the daytime? 1  Has anyone observed you stop breathing during your sleep? 0  Do you have, or are you being treated for high blood pressure? 1  BMI more than 35 kg/m2? 0  Age over 63 years old? 1  Neck circumference greater than 40 cm/18 inches? 0  Gender: 0  Obstructive Sleep Apnea Score 4  Score 4 or greater  Results sent to PCP

## 2013-06-27 NOTE — Progress Notes (Signed)
Anesthesia Note:  Patient is a 61 year old female scheduled for temporal artery biopsy on 06/30/13 by Dr. Early.  Patient is scheduled to be a same day work-up.  Phone interview by Todd, RN was done this afternoon.  She reported a history of an abnormal stress test last year followed by a "normal" cardiac cath in [place], [place]. I was told that [facility] was going to fax the cath report, but minutes before 1700 was notified that they could not locate a cath report.     Other history includes non-smoker, HTN, HLD, hepatitis C, anemia, depression, hysterectomy, breast reduction surgery, question of breast cancer (in PCP notes, but patient denied). PCP is Dr. Sean Hommel.    Nuclear stress test (scanned in Epic) from 03/11/12 showed: 1) No EKG evidence of ischemia. 2) Technically difficult study because of breast attenuation artifact and excessive subdiaphragmatic visceral uptake. 3) Reduced radiotracer count during stress images and inferolateral, lateral and anterior wall concerning for ischemia and multivessel distribution.  This is somewhat technically difficult study. 4) LVEF 69% with no wall motion abnormality.    Other diagnostic studies are pending the day of surgery.  Unfortunately, since patient is a same day work up and it is now after 5:00 pm we are unable to obtain any additional records until 06/30/13.  Patient is pretty certain that the cath was done at [facility] despite them saying they do not have record of a cath. The office of her prior PCP Dr. Nabil Tadros was also contacted and they did not have a copy of a cath either.    I have updated anesthesiologist Dr. Fitzgerald.  Nursing staff will need to re-attempt to obtain further cardiology records on Monday.  She will also be evaluated by anesthesia and preoperative diagnostic studies reviewed.  If there are any concerns from her anesthesiologist and records are still pending then it is possible  she will need to be moved to Dr. Early's last case to allow more time to receive records.  This will be determined by her anesthesiologist after evaluation.  Emmamarie Kluender, PA-C [facility]/Anesthesiology Phone [phone] 06/27/2013 5:10 PM  

## 2013-06-27 NOTE — Progress Notes (Signed)
Patient reports that she had an abnormal stress test in 2013 followed by a clean cardiac cath. Revonda Standard notified requested cardiac cath from [facility] in [place], [place]

## 2013-06-28 NOTE — Progress Notes (Signed)
Attempted to contact Dr. Arbie Cookey regarding concerns from [place], [place] and being unable to get stress test. Did not get return call. Spoke to Dr. Ivin Booty about patient d/t now being first case he advised no need at further attempt to contact Dr. Arbie Cookey that patient could be evaluated DOS

## 2013-06-29 MED ORDER — CEFUROXIME SODIUM 1.5 G IJ SOLR
1.5000 g | INTRAMUSCULAR | Status: AC
  Administered 2013-06-30: 1.5 g via INTRAVENOUS
  Filled 2013-06-29: qty 1.5

## 2013-06-30 ENCOUNTER — Ambulatory Visit (HOSPITAL_COMMUNITY): Payer: BC Managed Care – PPO

## 2013-06-30 ENCOUNTER — Ambulatory Visit (HOSPITAL_COMMUNITY): Payer: BC Managed Care – PPO | Admitting: Vascular Surgery

## 2013-06-30 ENCOUNTER — Encounter (HOSPITAL_COMMUNITY)
Admission: RE | Disposition: A | Discharge status: Home / Self Care | Payer: Self-pay | Source: Ambulatory Visit | Attending: Vascular Surgery

## 2013-06-30 ENCOUNTER — Ambulatory Visit (HOSPITAL_COMMUNITY)
Admission: RE | Admit: 2013-06-30 | Discharge: 2013-06-30 | Disposition: A | Discharge status: Home / Self Care | Payer: BC Managed Care – PPO | Source: Ambulatory Visit | Attending: Vascular Surgery | Admitting: Vascular Surgery

## 2013-06-30 ENCOUNTER — Encounter (HOSPITAL_COMMUNITY): Payer: Self-pay | Admitting: *Deleted

## 2013-06-30 ENCOUNTER — Encounter (HOSPITAL_COMMUNITY): Payer: Self-pay | Admitting: Vascular Surgery

## 2013-06-30 DIAGNOSIS — R51 Headache: Secondary | ICD-10-CM

## 2013-06-30 DIAGNOSIS — B1920 Unspecified viral hepatitis C without hepatic coma: Secondary | ICD-10-CM | POA: Insufficient documentation

## 2013-06-30 DIAGNOSIS — F329 Major depressive disorder, single episode, unspecified: Secondary | ICD-10-CM | POA: Insufficient documentation

## 2013-06-30 DIAGNOSIS — F17200 Nicotine dependence, unspecified, uncomplicated: Secondary | ICD-10-CM | POA: Insufficient documentation

## 2013-06-30 DIAGNOSIS — I10 Essential (primary) hypertension: Secondary | ICD-10-CM | POA: Insufficient documentation

## 2013-06-30 DIAGNOSIS — E7800 Pure hypercholesterolemia, unspecified: Secondary | ICD-10-CM | POA: Insufficient documentation

## 2013-06-30 DIAGNOSIS — D649 Anemia, unspecified: Secondary | ICD-10-CM | POA: Insufficient documentation

## 2013-06-30 DIAGNOSIS — F3289 Other specified depressive episodes: Secondary | ICD-10-CM | POA: Insufficient documentation

## 2013-06-30 DIAGNOSIS — M316 Other giant cell arteritis: Secondary | ICD-10-CM

## 2013-06-30 HISTORY — DX: Headache: R51

## 2013-06-30 HISTORY — DX: Depression, unspecified: F32.A

## 2013-06-30 HISTORY — DX: Major depressive disorder, single episode, unspecified: F32.9

## 2013-06-30 HISTORY — DX: Personal history of diseases of the blood and blood-forming organs and certain disorders involving the immune mechanism: Z86.2

## 2013-06-30 HISTORY — PX: ARTERY BIOPSY: SHX891

## 2013-06-30 LAB — COMPREHENSIVE METABOLIC PANEL
ALT: 14 U/L (ref 0–35)
Alkaline Phosphatase: 52 U/L (ref 39–117)
BUN: 24 mg/dL — ABNORMAL HIGH (ref 6–23)
CO2: 27 mEq/L (ref 19–32)
Chloride: 101 mEq/L (ref 96–112)
GFR calc Af Amer: >90 mL/min (ref >90)
Glucose, Bld: 140 mg/dL — ABNORMAL HIGH (ref 70–99)
Potassium: 4.2 mEq/L (ref 3.5–5.1)
Sodium: 137 mEq/L (ref 135–145)
Total Bilirubin: 0.3 mg/dL (ref 0.3–1.2)
Total Protein: 7.1 g/dL (ref 6.0–8.3)

## 2013-06-30 LAB — CBC
HCT: 38.7 % (ref 36.0–46.0)
Hemoglobin: 13.5 g/dL (ref 12.0–15.0)
MCHC: 34.9 g/dL (ref 30.0–36.0)
RBC: 4.44 MIL/uL (ref 3.87–5.11)
WBC: 15.4 10*3/uL — ABNORMAL HIGH (ref 4.0–10.5)

## 2013-06-30 LAB — GLUCOSE, CAPILLARY: Glucose-Capillary: 134 mg/dL — ABNORMAL HIGH (ref 70–99)

## 2013-06-30 SURGERY — BIOPSY TEMPORAL ARTERY
Anesthesia: Monitor Anesthesia Care | Site: Head | Laterality: Right | Wound class: Clean

## 2013-06-30 MED ORDER — OXYCODONE HCL 5 MG/5ML PO SOLN: 5.0000 mg | Freq: Once | ORAL | Status: DC | PRN

## 2013-06-30 MED ORDER — OXYCODONE HCL 5 MG PO TABS: 5.0000 mg | ORAL_TABLET | Freq: Once | ORAL | Status: DC | PRN

## 2013-06-30 MED ORDER — LIDOCAINE-EPINEPHRINE 0.5 %-1:200000 IJ SOLN
INTRAMUSCULAR | Status: AC
  Filled 2013-06-30: qty 1

## 2013-06-30 MED ORDER — PROMETHAZINE HCL 25 MG/ML IJ SOLN: 6.2500 mg | INTRAMUSCULAR | Status: DC | PRN

## 2013-06-30 MED ORDER — LACTATED RINGERS IV SOLN
INTRAVENOUS | Status: DC | PRN
  Administered 2013-06-30: 07:00:00 via INTRAVENOUS

## 2013-06-30 MED ORDER — METOPROLOL SUCCINATE ER 100 MG PO TB24
100.0000 mg | ORAL_TABLET | Freq: Every day | ORAL | Status: DC
  Administered 2013-06-30: 100 mg via ORAL
  Filled 2013-06-30 (×2): qty 1

## 2013-06-30 MED ORDER — FENTANYL CITRATE 0.05 MG/ML IJ SOLN
INTRAMUSCULAR | Status: DC | PRN
  Administered 2013-06-30: 50 ug via INTRAVENOUS

## 2013-06-30 MED ORDER — MIDAZOLAM HCL 2 MG/2ML IJ SOLN: 0.5000 mg | Freq: Once | INTRAMUSCULAR | Status: DC | PRN

## 2013-06-30 MED ORDER — PROPOFOL INFUSION 10 MG/ML OPTIME
INTRAVENOUS | Status: DC | PRN
  Administered 2013-06-30: 75 ug/kg/min via INTRAVENOUS

## 2013-06-30 MED ORDER — ONDANSETRON HCL 4 MG/2ML IJ SOLN
INTRAMUSCULAR | Status: DC | PRN
  Administered 2013-06-30: 4 mg via INTRAVENOUS

## 2013-06-30 MED ORDER — LIDOCAINE HCL (CARDIAC) 20 MG/ML IV SOLN
INTRAVENOUS | Status: DC | PRN
  Administered 2013-06-30: 40 mg via INTRAVENOUS

## 2013-06-30 MED ORDER — LIDOCAINE-EPINEPHRINE 0.5 %-1:200000 IJ SOLN
INTRAMUSCULAR | Status: DC | PRN
  Administered 2013-06-30: 50 mL

## 2013-06-30 MED ORDER — IBUPROFEN 600 MG PO TABS
600.0000 mg | ORAL_TABLET | Freq: Four times a day (QID) | ORAL | Status: AC
  Administered 2013-06-30: 600 mg via ORAL
  Filled 2013-06-30 (×2): qty 1

## 2013-06-30 MED ORDER — MEPERIDINE HCL 25 MG/ML IJ SOLN: 6.2500 mg | INTRAMUSCULAR | Status: DC | PRN

## 2013-06-30 MED ORDER — 0.9 % SODIUM CHLORIDE (POUR BTL) OPTIME
TOPICAL | Status: DC | PRN
  Administered 2013-06-30: 1000 mL

## 2013-06-30 MED ORDER — SODIUM CHLORIDE 0.9 % IV SOLN: INTRAVENOUS | Status: DC

## 2013-06-30 MED ORDER — FENTANYL CITRATE 0.05 MG/ML IJ SOLN: 25.0000 ug | INTRAMUSCULAR | Status: DC | PRN

## 2013-06-30 MED ORDER — MIDAZOLAM HCL 5 MG/5ML IJ SOLN
INTRAMUSCULAR | Status: DC | PRN
  Administered 2013-06-30 (×2): 1 mg via INTRAVENOUS

## 2013-06-30 MED ORDER — OXYCODONE-ACETAMINOPHEN 5-325 MG PO TABS: 1.0000 | ORAL_TABLET | ORAL | Status: DC | PRN

## 2013-06-30 SURGICAL SUPPLY — 45 items
BENZOIN TINCTURE PRP APPL 2/3 (GAUZE/BANDAGES/DRESSINGS) IMPLANT
CANISTER SUCTION 2500CC (MISCELLANEOUS) ×2 IMPLANT
CLIP LIGATING EXTRA MED SLVR (CLIP) ×2 IMPLANT
CLIP LIGATING EXTRA SM BLUE (MISCELLANEOUS) ×2 IMPLANT
CLOTH BEACON ORANGE TIMEOUT ST (SAFETY) ×2 IMPLANT
CONT SPEC 4OZ CLIKSEAL STRL BL (MISCELLANEOUS) ×2 IMPLANT
COTTONBALL LRG STERILE PKG (GAUZE/BANDAGES/DRESSINGS) ×2 IMPLANT
COVER SURGICAL LIGHT HANDLE (MISCELLANEOUS) ×2 IMPLANT
DECANTER SPIKE VIAL GLASS SM (MISCELLANEOUS) ×2 IMPLANT
DERMABOND ADVANCED (GAUZE/BANDAGES/DRESSINGS) ×1
DERMABOND ADVANCED .7 DNX12 (GAUZE/BANDAGES/DRESSINGS) ×1 IMPLANT
DRAPE LAPAROTOMY T 102X78X121 (DRAPES) ×2 IMPLANT
ELECT REM PT RETURN 9FT ADLT (ELECTROSURGICAL) ×2
ELECTRODE REM PT RTRN 9FT ADLT (ELECTROSURGICAL) ×1 IMPLANT
GAUZE SPONGE 2X2 8PLY STRL LF (GAUZE/BANDAGES/DRESSINGS) IMPLANT
GEL ULTRASOUND 20GR AQUASONIC (MISCELLANEOUS) ×2 IMPLANT
GLOVE BIOGEL PI IND STRL 6.5 (GLOVE) ×1 IMPLANT
GLOVE BIOGEL PI IND STRL 7.0 (GLOVE) ×1 IMPLANT
GLOVE BIOGEL PI INDICATOR 6.5 (GLOVE) ×1
GLOVE BIOGEL PI INDICATOR 7.0 (GLOVE) ×1
GLOVE ECLIPSE 6.5 STRL STRAW (GLOVE) ×2 IMPLANT
GLOVE SS BIOGEL STRL SZ 7.5 (GLOVE) ×1 IMPLANT
GLOVE SUPERSENSE BIOGEL SZ 7.5 (GLOVE) ×1
GOWN STRL NON-REIN LRG LVL3 (GOWN DISPOSABLE) ×4 IMPLANT
KIT BASIN OR (CUSTOM PROCEDURE TRAY) ×2 IMPLANT
KIT ROOM TURNOVER OR (KITS) ×2 IMPLANT
LOOP VESSEL MAXI BLUE (MISCELLANEOUS) ×2 IMPLANT
NEEDLE HYPO 25GX1X1/2 BEV (NEEDLE) ×2 IMPLANT
NS IRRIG 1000ML POUR BTL (IV SOLUTION) ×2 IMPLANT
PACK GENERAL/GYN (CUSTOM PROCEDURE TRAY) ×2 IMPLANT
PAD ARMBOARD 7.5X6 YLW CONV (MISCELLANEOUS) ×4 IMPLANT
SPONGE GAUZE 2X2 STER 10/PKG (GAUZE/BANDAGES/DRESSINGS)
SPONGE LAP 4X18 X RAY DECT (DISPOSABLE) ×2 IMPLANT
STRIP CLOSURE SKIN 1/2X4 (GAUZE/BANDAGES/DRESSINGS) IMPLANT
SUT SILK 3 0 (SUTURE) ×1
SUT SILK 3-0 18XBRD TIE 12 (SUTURE) ×1 IMPLANT
SUT SILK 4 0 (SUTURE) ×1
SUT SILK 4-0 18XBRD TIE 12 (SUTURE) ×1 IMPLANT
SUT VIC AB 3-0 SH 27 (SUTURE) ×1
SUT VIC AB 3-0 SH 27X BRD (SUTURE) ×1 IMPLANT
SUT VICRYL 4-0 PS2 18IN ABS (SUTURE) ×2 IMPLANT
SYR CONTROL 10ML LL (SYRINGE) ×2 IMPLANT
TOWEL OR 17X24 6PK STRL BLUE (TOWEL DISPOSABLE) ×2 IMPLANT
TOWEL OR 17X26 10 PK STRL BLUE (TOWEL DISPOSABLE) ×2 IMPLANT
WATER STERILE IRR 1000ML POUR (IV SOLUTION) IMPLANT

## 2013-06-30 NOTE — Anesthesia Postprocedure Evaluation (Signed)
  Anesthesia Post-op Note  Patient: Kayla Gallagher  Procedure(s) Performed: Procedure(s): BIOPSY TEMPORAL ARTERY (Right)  Patient Location: PACU  Anesthesia Type:MAC  Level of Consciousness: awake, alert , oriented and patient cooperative  Airway and Oxygen Therapy: Patient Spontanous Breathing  Post-op Pain: mild headache  Post-op Assessment: Post-op Vital signs reviewed, Patient's Cardiovascular Status Stable, Respiratory Function Stable, Patent Airway, No signs of Nausea or vomiting and Pain level controlled  Post-op Vital Signs: Reviewed and stable  Complications: No apparent anesthesia complications

## 2013-06-30 NOTE — Anesthesia Procedure Notes (Signed)
Procedure Name: MAC Date/Time: 06/30/2013 7:33 AM Performed by: Ellin Goodie Pre-anesthesia Checklist: Patient identified, Emergency Drugs available, Suction available, Patient being monitored and Timeout performed Patient Re-evaluated:Patient Re-evaluated prior to inductionOxygen Delivery Method: Simple face mask Preoxygenation: Pre-oxygenation with 100% oxygen Intubation Type: IV induction Placement Confirmation: positive ETCO2 and breath sounds checked- equal and bilateral Dental Injury: Teeth and Oropharynx as per pre-operative assessment

## 2013-06-30 NOTE — H&P (View-Only) (Signed)
Anesthesia Note:  Patient is a 62 year old female scheduled for temporal artery biopsy on 06/30/13 by Dr. Arbie Cookey.  Patient is scheduled to be a same day work-up.  Phone interview by Tawanna Cooler, RN was done this afternoon.  She reported a history of an abnormal stress test last year followed by a "normal" cardiac cath in [place], [place]. I was told that [facility] was going to fax the cath report, but minutes before 1700 was notified that they could not locate a cath report.     Other history includes non-smoker, HTN, HLD, hepatitis C, anemia, depression, hysterectomy, breast reduction surgery, question of breast cancer (in PCP notes, but patient denied). PCP is Dr. Laren Boom.    Nuclear stress test (scanned in Epic) from 03/11/12 showed: 1) No EKG evidence of ischemia. 2) Technically difficult study because of breast attenuation artifact and excessive subdiaphragmatic visceral uptake. 3) Reduced radiotracer count during stress images and inferolateral, lateral and anterior wall concerning for ischemia and multivessel distribution.  This is somewhat technically difficult study. 4) LVEF 69% with no wall motion abnormality.    Other diagnostic studies are pending the day of surgery.  Unfortunately, since patient is a same day work up and it is now after 5:00 pm we are unable to obtain any additional records until 06/30/13.  Patient is pretty certain that the cath was done at [facility] despite them saying they do not have record of a cath. The office of her prior PCP Dr. Valerie Roys was also contacted and they did not have a copy of a cath either.    I have updated anesthesiologist Dr. Sampson Goon.  Nursing staff will need to re-attempt to obtain further cardiology records on Monday.  She will also be evaluated by anesthesia and preoperative diagnostic studies reviewed.  If there are any concerns from her anesthesiologist and records are still pending then it is possible  she will need to be moved to Dr. Bosie Helper last case to allow more time to receive records.  This will be determined by her anesthesiologist after evaluation.  Velna Ochs [facility]/Anesthesiology Phone [phone] 06/27/2013 5:10 PM

## 2013-06-30 NOTE — Op Note (Signed)
OPERATIVE REPORT  DATE OF SURGERY: 06/30/2013  PATIENT: CHRISTIE VISCOMI, 62 y.o. female MRN: [id number]  DOB: 03-Jun-1951  PRE-OPERATIVE DIAGNOSIS: Rule out giant cell arteritis  POST-OPERATIVE DIAGNOSIS:  Same  PROCEDURE: Right temporal artery biopsy  SURGEON:  Gretta Began, M.D.  PHYSICIAN ASSISTANT: Nurse  ANESTHESIA:  Local with sedation  EBL: Minimal ml  Total I/O In: 700 [I.V.:700] Out: -   BLOOD ADMINISTERED: None  DRAINS: None  SPECIMEN: Right temporal artery  COUNTS CORRECT:  YES  PLAN OF CARE: PACU   PATIENT DISPOSITION:  PACU - hemodynamically stable  PROCEDURE DETAILS: The patient was taken to the operating room placed supine position where the area of the right preauricular area was prepped and draped in sterile fashion. Local anesthesia was used since that the ties this area. The vision was made in the preauricular skin line. This was carried down through the subcutaneous fat to identify the temporal artery. There was some tortuosity of the temporal artery. Tributary branches were ligated. The temporal artery was ligated proximally and distally and was divided and the specimen was passed off the field. Approximately 2 cm specimen. Wound irrigated with saline and hemostasis electrocautery. The wound is closed with 3-0 Vicryl in several layers subcutaneous tissue. Skin was closed with 4 septic Vicryl stitch. Sterile dressing was applied and the patient taken to recovery in stable condition   Gretta Began, M.D. 06/30/2013 8:57 AM

## 2013-06-30 NOTE — Preoperative (Signed)
Beta Blockers   Reason not to administer Beta Blockers:Not Applicable 

## 2013-06-30 NOTE — Anesthesia Preprocedure Evaluation (Addendum)
Anesthesia Evaluation  Patient identified by MRN, date of birth, ID band Patient awake    Reviewed: Allergy & Precautions, H&P , NPO status , Patient's Chart, lab work & pertinent test results, reviewed documented beta blocker date and time   History of Anesthesia Complications Negative for: history of anesthetic complications  Airway Mallampati: II TM Distance: >3 FB Neck ROM: Full    Dental  (+) Teeth Intact and Dental Advisory Given   Pulmonary neg pulmonary ROS,  breath sounds clear to auscultation  Pulmonary exam normal       Cardiovascular hypertension, Pt. on medications and Pt. on home beta blockers + Peripheral Vascular Disease (vasculitis: on steroids) Rhythm:Regular Rate:Normal  '13 stress test: no ischemia, EF 69%   Neuro/Psych  Headaches, Depression    GI/Hepatic negative GI ROS, (+) Hepatitis -, C  Endo/Other  diabetes, Well Controlled, Type 2, Oral Hypoglycemic AgentsMorbid obesity  Renal/GU negative Renal ROS     Musculoskeletal   Abdominal (+) + obese,  Abdomen: soft. Bowel sounds: normal.  Peds  Hematology negative hematology ROS (+)   Anesthesia Other Findings   Reproductive/Obstetrics                        Anesthesia Physical Anesthesia Plan  ASA: III  Anesthesia Plan: MAC   Post-op Pain Management:    Induction:   Airway Management Planned: Simple Face Mask  Additional Equipment:   Intra-op Plan:   Post-operative Plan:   Informed Consent: I have reviewed the patients History and Physical, chart, labs and discussed the procedure including the risks, benefits and alternatives for the proposed anesthesia with the patient or authorized representative who has indicated his/her understanding and acceptance.   Dental advisory given  Plan Discussed with: CRNA and Surgeon  Anesthesia Plan Comments: (Plan routine monitors, MAC)        Anesthesia Quick  Evaluation

## 2013-06-30 NOTE — Op Note (Signed)
OPERATIVE REPORT  DATE OF SURGERY: 06/30/2013  PATIENT: KYRAN WHITTIER, 62 y.o. female MRN: [id number]  DOB: 1951/05/26  PRE-OPERATIVE DIAGNOSIS: Rule out giant cell arteritis  POST-OPERATIVE DIAGNOSIS:  Same  PROCEDURE: Right temporal artery biopsy  SURGEON:  Gretta Began, M.D.  PHYSICIAN ASSISTANT: Nurse  ANESTHESIA:  Local with sedation  EBL: Minimal ml     BLOOD ADMINISTERED: None  DRAINS: None  SPECIMEN: Right temporal artery  COUNTS CORRECT:  YES  PLAN OF CARE: PACU   PATIENT DISPOSITION:  PACU - hemodynamically stable  PROCEDURE DETAILS: The patient was taken to the operating room placed supine position where the area of the right preauricular area was prepped and draped in sterile fashion. Local anesthesia was used since that the ties this area. The vision was made in the preauricular skin line. This was carried down through the subcutaneous fat to identify the temporal artery. There was some tortuosity of the temporal artery. Tributary branches were ligated. The temporal artery was ligated proximally and distally and was divided and the specimen was passed off the field. Approximately 2 cm specimen. Wound irrigated with saline and hemostasis electrocautery. The wound is closed with 3-0 Vicryl in several layers subcutaneous tissue. Skin was closed with 4 septic Vicryl stitch. Sterile dressing was applied and the patient taken to recovery in stable condition   Gretta Began, M.D. 06/30/2013 8:43 AM

## 2013-06-30 NOTE — Addendum Note (Signed)
Addendum created 06/30/13 0932 by Ellin Goodie, CRNA   Modules edited: Anesthesia LDA

## 2013-06-30 NOTE — Interval H&P Note (Signed)
History and Physical Interval Note:  06/30/2013 7:30 AM  Kayla Gallagher  has presented today for surgery, with the diagnosis of TEMPORAL ARTERITIS  The various methods of treatment have been discussed with the patient and family. After consideration of risks, benefits and other options for treatment, the patient has consented to  Procedure(s): BIOPSY TEMPORAL ARTERY (Right) as a surgical intervention .  The patient's history has been reviewed, patient examined, no change in status, stable for surgery.  I have reviewed the patient's chart and labs.  Questions were answered to the patient's satisfaction.     Dannette Kinkaid

## 2013-06-30 NOTE — Transfer of Care (Signed)
Immediate Anesthesia Transfer of Care Note  Patient: Kayla Gallagher  Procedure(s) Performed: Procedure(s): BIOPSY TEMPORAL ARTERY (Right)  Patient Location: PACU  Anesthesia Type:MAC  Level of Consciousness: awake, alert  and oriented  Airway & Oxygen Therapy: Patient connected to face mask  Post-op Assessment: Report given to PACU RN  Post vital signs: stable  Complications: No apparent anesthesia complications

## 2013-07-01 ENCOUNTER — Telehealth: Payer: Self-pay | Admitting: Vascular Surgery

## 2013-07-01 ENCOUNTER — Encounter (HOSPITAL_COMMUNITY): Payer: Self-pay | Admitting: Vascular Surgery

## 2013-07-01 NOTE — Telephone Encounter (Signed)
[facility] overbooked pt on 9/2 @ 1:00, MAR lm on hm # to confirm appt  Tamicka Shimon #[id number] ----- Message ----- From: Larina Earthly, MD Sent: 06/30/2013 9:19 AM To: Reuel Derby, Melene Plan, RN Kellen,Cherrill right temporal artery biopsy. Need ov in two weeks

## 2013-07-02 ENCOUNTER — Encounter: Payer: Self-pay | Admitting: Family Medicine

## 2013-07-02 ENCOUNTER — Ambulatory Visit (INDEPENDENT_AMBULATORY_CARE_PROVIDER_SITE_OTHER): Payer: BC Managed Care – PPO | Admitting: Family Medicine

## 2013-07-02 VITALS — BP 201/97 | HR 60 | Wt 217.0 lb

## 2013-07-02 DIAGNOSIS — I10 Essential (primary) hypertension: Secondary | ICD-10-CM

## 2013-07-02 DIAGNOSIS — M316 Other giant cell arteritis: Secondary | ICD-10-CM

## 2013-07-02 MED ORDER — AMLODIPINE BESYLATE 10 MG PO TABS: ORAL_TABLET | ORAL | Status: DC

## 2013-07-02 NOTE — Progress Notes (Signed)
CC: Kayla Gallagher is a 62 y.o. female is here for Hypertension   Subjective: HPI:  Followup hypertension: Continues on lisinopril twice a day along with metoprolol. Outside blood pressures have consistently been above 160/90 at vascular surgery office. Denies change in diet or exercise routine. Blood pressures have also been elevated frequently in our office. She's had recent headaches that are now resolved, see below. Denies chest pain, shortness of breath, motor sensory disturbances, orthopnea nor peripheral edema  Followup headache and fatigue: There was a suspicion for temporal arteritis due to elevated CRP and ESR in the setting of right temporal headache and fatigue. Biopsy earlier this week was negative however she reports significant resolution of headache along with fatigue and muscle weakness since starting 40 mg of prednisone. She is tolerating this well she denies any known side effects. Denies mental disturbance.   Review Of Systems Outlined In HPI  Past Medical History  Diagnosis Date  . Hyperlipidemia   . History of hepatitis C 01/07/2013  . Diabetes mellitus without complication   . Hypertension     Does not see cardiology  . Essential hypertension, benign 01/07/2013  . Depression   . Headache(784.0)   . History of anemia   . History of breast cancer      Family History  Problem Relation Age of Onset  . Alcoholism      parents  . Breast cancer Sister   . Prostate cancer      grandfather  . Heart attack Father   . Diabetes Father     grandmother  . Cancer Father   . Heart disease Father   . Hyperlipidemia Father   . Hypertension Father   . Hyperlipidemia      grandfather  . Cancer Mother      History  Substance Use Topics  . Smoking status: Never Smoker   . Smokeless tobacco: Not on file  . Alcohol Use: No     Objective: Filed Vitals:   07/02/13 0838  BP: 201/97  Pulse: 60    General: Alert and Oriented, No Acute Distress HEENT: Pupils equal,  round, reactive to light. Conjunctivae clear. Moist mucous membranes pharynx unremarkable Lungs: Clear to auscultation bilaterally, no wheezing/ronchi/rales.  Comfortable work of breathing. Good air movement. Cardiac: Regular rate and rhythm. Normal S1/S2.  No murmurs, rubs, nor gallops.   Extremities: No peripheral edema.  Strong peripheral pulses. Full range of motion of the upper extremities without weakness Mental Status: No depression, anxiety, nor agitation. Skin: Warm and dry. Right temporal biopsy site is clean dry and intact well approximated  Assessment & Plan: Kayla Gallagher was seen today for hypertension.  Diagnoses and associated orders for this visit:  Essential hypertension, benign - amLODipine (NORVASC) 10 MG tablet; One tablet by mouth every day for blood pressure control.  Temporal arteritis    Essential hypertension: Controlled chronic condition will add amlodipine, suspect prednisone may be worsening this but blood pressure was uncontrolled even stemming back before prednisone use Temporal arteritis: Clinical suspicion of this or polymyalgia rheumatica remains high despite biopsy I encouraged her to continue on prednisone and was told otherwise by vascular surgery in the middle of September will consider slowly tapering down   Return in about 2 weeks (around 07/16/2013) for BP and Prednisone F/U.

## 2013-07-11 ENCOUNTER — Encounter: Payer: Self-pay | Admitting: Vascular Surgery

## 2013-07-15 ENCOUNTER — Ambulatory Visit (INDEPENDENT_AMBULATORY_CARE_PROVIDER_SITE_OTHER): Payer: Self-pay | Admitting: Vascular Surgery

## 2013-07-15 ENCOUNTER — Encounter: Payer: Self-pay | Admitting: Vascular Surgery

## 2013-07-15 VITALS — BP 135/85 | HR 68 | Resp 18 | Ht 69.0 in | Wt 217.0 lb

## 2013-07-15 DIAGNOSIS — M316 Other giant cell arteritis: Secondary | ICD-10-CM

## 2013-07-15 NOTE — Progress Notes (Signed)
The patient presents today for followup of her right temporal artery bypass as an outpatient on 06/30/2013. She's had very nice healing of her incision with minimal soreness.  I did review her surgical pathology and that shows no evidence of arteritis.  Impression and plan stable status post right temporal artery bypass for evaluation of possible April arteritis which is not present on pathologic specimen. She will followup with [place] on an as-needed basis

## 2013-07-18 ENCOUNTER — Ambulatory Visit (INDEPENDENT_AMBULATORY_CARE_PROVIDER_SITE_OTHER): Payer: BC Managed Care – PPO | Admitting: Family Medicine

## 2013-07-18 ENCOUNTER — Encounter: Payer: Self-pay | Admitting: Family Medicine

## 2013-07-18 VITALS — BP 152/80 | HR 74 | Wt 217.0 lb

## 2013-07-18 DIAGNOSIS — M316 Other giant cell arteritis: Secondary | ICD-10-CM

## 2013-07-18 DIAGNOSIS — I10 Essential (primary) hypertension: Secondary | ICD-10-CM

## 2013-07-18 DIAGNOSIS — Z23 Encounter for immunization: Secondary | ICD-10-CM

## 2013-07-18 DIAGNOSIS — L0390 Cellulitis, unspecified: Secondary | ICD-10-CM

## 2013-07-18 DIAGNOSIS — L0291 Cutaneous abscess, unspecified: Secondary | ICD-10-CM

## 2013-07-18 MED ORDER — CEPHALEXIN 500 MG PO CAPS: 500.0000 mg | ORAL_CAPSULE | Freq: Every day | ORAL | Status: AC

## 2013-07-18 MED ORDER — SULFAMETHOXAZOLE-TRIMETHOPRIM 800-160 MG PO TABS: ORAL_TABLET | ORAL | Status: AC

## 2013-07-18 NOTE — Progress Notes (Signed)
CC: Kayla Gallagher is a 62 y.o. female is here for Hypertension   Subjective: HPI:  Followup temporal arteritis: Patient states she was advised by her vascular surgeon to stop prednisone after temporal artery biopsy was negative for vasculitis. She reports since stopping she has had a return of a frontal headache bilaterally along with jaw pain late in the day localized just anterior to the right ear nonradiating mild in severity. She is unsure whether or not chewing makes it better or worse she denies any new motor or sensory disturbances along with no vision loss. She denies any fatigue that she experienced prior to starting prednisone. Denies fevers, chills, unintentional weight loss. Headache is improved with Claritin nothing else makes better or worse.  Followup essential hypertension: She is started amlodipine on a daily basis blood pressures at home range from prehypertensive to hypertensive range. She denies motor or sensory disturbances, chest pain, shortness of breath, orthopnea, peripheral edema  Patient complains of scabs forming on her face and scalp have been present for one week they are worsening on a daily basis they're tender she denies any recent trauma to the sites. There are identical to that which responded to Bactrim about a month ago. She denies discharge at the site of scabs    Review Of Systems Outlined In HPI  Past Medical History  Diagnosis Date  . Hyperlipidemia   . History of hepatitis C 01/07/2013  . Diabetes mellitus without complication   . Hypertension     Does not see cardiology  . Essential hypertension, benign 01/07/2013  . Depression   . Headache(784.0)   . History of anemia   . History of breast cancer      Family History  Problem Relation Age of Onset  . Alcoholism      parents  . Prostate cancer      grandfather  . Hyperlipidemia      grandfather  . Breast cancer Sister   . Heart attack Father   . Diabetes Father     grandmother  .  Cancer Father   . Heart disease Father   . Hyperlipidemia Father   . Hypertension Father   . Cancer Mother      History  Substance Use Topics  . Smoking status: Never Smoker   . Smokeless tobacco: Not on file  . Alcohol Use: No     Objective: Filed Vitals:   07/18/13 0848  BP: 152/80  Pulse: 74    General: Alert and Oriented, No Acute Distress HEENT: Pupils equal, round, reactive to light. Conjunctivae clear.  Moist mucous membranes pharynx unremarkable temporal artery biopsy site just anterior to the right ear clean dry and intact well-healed Lungs: Clear to auscultation bilaterally, no wheezing/ronchi/rales.  Comfortable work of breathing. Good air movement. Cardiac: Regular rate and rhythm. Normal S1/S2.  No murmurs, rubs, nor gallops.   Extremities: No peripheral edema.  Strong peripheral pulses.  Mental Status: No depression, anxiety, nor agitation. Skin: Warm and dry. She has 2 mm-5 mm irregular shaped scabs on the forehead and on the occiput without fluctuance or induration  Assessment & Plan: [name] was seen today for hypertension.  Diagnoses and associated orders for this visit:  Need for prophylactic vaccination and inoculation against influenza  Cellulitis - sulfamethoxazole-trimethoprim (SEPTRA DS) 800-160 MG per tablet; One by mouth twice a day for ten days. - cephALEXin (KEFLEX) 500 MG capsule; Take 1 capsule (500 mg total) by mouth daily. To prevent recurrent cellulitis.  Essential hypertension,  benign  Temporal arteritis    Temporal arteritis: Discussed with patient that it is possible her jaw pain is due to trauma from the biopsy however if she continues to have jaw claudication and headache I would strongly encourage her to restart prednisone since she had symptomatic improvement despite a negative biopsy. Essential hypertension: Uncontrolled continue metoprolol lisinopril and amlodipine she would prefer to focus on strict diet and exercise before  titrating up metoprolol. Cellulitis: Start Bactrim we discussed risks and benefits of using a daily Keflex to prevent reoccurrence since she has had approximately 4 flares of cellulitis on her face over the past year.  Return in about 4 weeks (around 08/15/2013).   she is about to lose insurance for 2 months

## 2013-10-02 ENCOUNTER — Telehealth: Payer: Self-pay | Admitting: *Deleted

## 2013-10-02 NOTE — Telephone Encounter (Signed)
Pt called and requested a an abx called in for a sinus infection. Pt states she works in [place] and [place] [facility]. She states Dr. Ivan Anchors told her to call if she ever needed anything. I did tell her unfortunately we do not typically rx abx without an appt. Asked pt how long she had been feeling bad. She said several days. I advised she could try decongestants and and saline drops in the nose. Pt states she has tried that and she is on claritin. Pt states she will go to UC in [place]

## 2013-11-04 ENCOUNTER — Encounter: Payer: Self-pay | Admitting: Family Medicine

## 2013-11-14 ENCOUNTER — Ambulatory Visit (INDEPENDENT_AMBULATORY_CARE_PROVIDER_SITE_OTHER): Payer: BC Managed Care – PPO | Admitting: Family Medicine

## 2013-11-14 ENCOUNTER — Encounter: Payer: Self-pay | Admitting: Family Medicine

## 2013-11-14 ENCOUNTER — Telehealth: Payer: Self-pay | Admitting: *Deleted

## 2013-11-14 VITALS — BP 162/98 | HR 108 | Wt 214.0 lb

## 2013-11-14 DIAGNOSIS — I10 Essential (primary) hypertension: Secondary | ICD-10-CM

## 2013-11-14 DIAGNOSIS — E119 Type 2 diabetes mellitus without complications: Secondary | ICD-10-CM

## 2013-11-14 DIAGNOSIS — R928 Other abnormal and inconclusive findings on diagnostic imaging of breast: Secondary | ICD-10-CM

## 2013-11-14 DIAGNOSIS — Z853 Personal history of malignant neoplasm of breast: Secondary | ICD-10-CM

## 2013-11-14 DIAGNOSIS — Z8619 Personal history of other infectious and parasitic diseases: Secondary | ICD-10-CM

## 2013-11-14 LAB — COMPLETE METABOLIC PANEL WITH GFR
ALT: 32 U/L (ref 0–35)
AST: 34 U/L (ref 0–37)
Albumin: 4.5 g/dL (ref 3.5–5.2)
Alkaline Phosphatase: 66 U/L (ref 39–117)
BUN: 14 mg/dL (ref 6–23)
CO2: 25 meq/L (ref 19–32)
Calcium: 10 mg/dL (ref 8.4–10.5)
Chloride: 98 mEq/L (ref 96–112)
Creat: 0.76 mg/dL (ref 0.50–1.10)
GFR, Est African American: >89 mL/min
GFR, Est Non African American: 84 mL/min
Glucose, Bld: 244 mg/dL — ABNORMAL HIGH (ref 70–99)
POTASSIUM: 4.2 meq/L (ref 3.5–5.3)
Sodium: 136 mEq/L (ref 135–145)
Total Bilirubin: 0.5 mg/dL (ref 0.3–1.2)
Total Protein: 7.7 g/dL (ref 6.0–8.3)

## 2013-11-14 LAB — HEMOGLOBIN A1C
HEMOGLOBIN A1C: 9.9 % — AB (ref <5.7)
Mean Plasma Glucose: 237 mg/dL — ABNORMAL HIGH (ref <117)

## 2013-11-14 MED ORDER — LISINOPRIL 40 MG PO TABS: 40.0000 mg | ORAL_TABLET | Freq: Two times a day (BID) | ORAL | Status: DC

## 2013-11-14 MED ORDER — EST ESTROGENS-METHYLTEST 1.25-2.5 MG PO TABS: 1.0000 | ORAL_TABLET | Freq: Every day | ORAL | Status: DC

## 2013-11-14 MED ORDER — METOPROLOL SUCCINATE ER 100 MG PO TB24: 100.0000 mg | ORAL_TABLET | Freq: Every day | ORAL | Status: DC

## 2013-11-14 MED ORDER — AMLODIPINE BESYLATE 10 MG PO TABS: ORAL_TABLET | ORAL | Status: DC

## 2013-11-14 MED ORDER — PAROXETINE HCL 20 MG PO TABS
20.0000 mg | ORAL_TABLET | ORAL | Status: DC
Start: 2013-11-14 — End: 2015-02-02

## 2013-11-14 MED ORDER — METFORMIN HCL 1000 MG PO TABS: 1000.0000 mg | ORAL_TABLET | Freq: Two times a day (BID) | ORAL | Status: DC

## 2013-11-14 NOTE — Telephone Encounter (Signed)
[facility]la [facility]en in [facility] called and states they do not do diagnostic breast u/s and the order would need to go to the breast center

## 2013-11-14 NOTE — Progress Notes (Signed)
CC: Kayla Gallagher is a 63 y.o. female is here for Follow-up   Subjective: HPI:  Followup essential hypertension: No outside blood pressures to report, she has run out of her metoprolol. Continues on amlodipine and lisinopril. Denies chest pain, shortness of breath, orthopnea, peripheral edema, nor motor or sensory disturbances.  Followup type 2 diabetes: Continues on metformin twice a day without known side effects. No outside blood sugars to report. Denies polyuria polyphasia or polydipsia or vision loss  Followup abnormal mammogram: She has not had followup imaging from her spring 2014 abnormal mammogram and biopsy. She denies any new breast complaints or breast changes.  Denies architectural changes nor any palpable masses  She has run out of her estratest stop taking it about a month ago and since then has noticed a return of her vaginal irritation with dryness.   Review Of Systems Outlined In HPI  Past Medical History  Diagnosis Date  . Hyperlipidemia   . History of hepatitis C 01/07/2013  . Diabetes mellitus without complication   . Hypertension     Does not see cardiology  . Essential hypertension, benign 01/07/2013  . Depression   . Headache(784.0)   . History of anemia   . History of breast cancer      Family History  Problem Relation Age of Onset  . Alcoholism      parents  . Prostate cancer      grandfather  . Hyperlipidemia      grandfather  . Breast cancer Sister   . Heart attack Father   . Diabetes Father     grandmother  . Cancer Father   . Heart disease Father   . Hyperlipidemia Father   . Hypertension Father   . Cancer Mother      History  Substance Use Topics  . Smoking status: Never Smoker   . Smokeless tobacco: Not on file  . Alcohol Use: No     Objective: Filed Vitals:   11/14/13 0827  BP: 162/98  Pulse: 108    General: Alert and Oriented, No Acute Distress HEENT: Pupils equal, round, reactive to light. Conjunctivae clear.  Moist  mucous membranes pharynx unremarkable Lungs: Clear to auscultation bilaterally, no wheezing/ronchi/rales.  Comfortable work of breathing. Good air movement. Cardiac: Regular rate and rhythm. Normal S1/S2.  No murmurs, rubs, nor gallops.   Extremities: No peripheral edema.  Strong peripheral pulses.  Mental Status: No depression, anxiety, nor agitation. Skin: Warm and dry.  Assessment & Plan: Kayla Gallagher was seen today for follow-up.  Diagnoses and associated orders for this visit:  Essential hypertension, benign - amLODipine (NORVASC) 10 MG tablet; One tablet by mouth every day for blood pressure control.  History of breast cancer - MM Digital Diagnostic Bilat; Future - US Breast Bilateral; Future  Type 2 diabetes mellitus - COMPLETE METABOLIC PANEL WITH GFR - Hemoglobin A1c  History of hepatitis C - COMPLETE METABOLIC PANEL WITH GFR  Abnormal mammogram - MM Digital Diagnostic Bilat; Future - US Breast Bilateral; Future  Other Orders - estrogen-methylTESTOSTERone (ESTRATEST) 1.25-2.5 MG per tablet; Take 1 tablet by mouth daily. - lisinopril (PRINIVIL,ZESTRIL) 40 MG tablet; Take 1 tablet (40 mg total) by mouth 2 (two) times daily. - metFORMIN (GLUCOPHAGE) 1000 MG tablet; Take 1 tablet (1,000 mg total) by mouth 2 (two) times daily with a meal. - metoprolol succinate (TOPROL-XL) 100 MG 24 hr tablet; Take 1 tablet (100 mg total) by mouth daily. Take with or immediately following a meal. - PARoxetine (PAXIL)  20 MG tablet; Take 1 tablet (20 mg total) by mouth every morning.    Essential hypertension: Uncontrolled chronic condition restart metoprolol return for blood pressure check one week Abnormal mammogram: Orders were placed again for diagnostic mammogram with ultrasound she was also given the phone number to our radiology office downstairs today she can schedule this at her convenience History of hepatitis C: Following annual liver enzymes Type 2 diabetes: Clinically controlled  however due for A1c continue metformin She declines flu shot again  Return in about 3 months (around 02/12/2014).

## 2013-11-14 NOTE — Telephone Encounter (Signed)
Noted,  Can you please print off the order and fax to the breast center and update Mrs. Kayla Gallagher

## 2013-11-17 ENCOUNTER — Telehealth: Payer: Self-pay | Admitting: Family Medicine

## 2013-11-17 DIAGNOSIS — E119 Type 2 diabetes mellitus without complications: Secondary | ICD-10-CM

## 2013-11-17 MED ORDER — SITAGLIPTIN PHOSPHATE 100 MG PO TABS: 100.0000 mg | ORAL_TABLET | Freq: Every day | ORAL | Status: DC

## 2013-11-17 NOTE — Telephone Encounter (Signed)
Printed and faxed

## 2013-11-17 NOTE — Telephone Encounter (Signed)
Pt notified;pt states she has not been taking the metformin at all. I advised her to restart the metformin and then come back in 3 months to recheck a1c. She will hold off on getting new rx right now

## 2013-11-17 NOTE — Telephone Encounter (Signed)
Sue Lushndrea, Will you please let [name] know that her blood sugar control is worsening.  A1c was 9.9 relative to 7.4 back in the fall.  Our goal is less than 7.  I'd recommend that she start on an additional diabetic medicaiton called Januvia which I've [facility] [place].  I believe we have savings cards that should significantly cut down on the cost if she'd like to pick one up before getting the Rx. F/U in 3 months.  Liver function was perfect.

## 2014-01-01 ENCOUNTER — Other Ambulatory Visit: Payer: Self-pay | Admitting: Family Medicine

## 2014-01-01 DIAGNOSIS — Z853 Personal history of malignant neoplasm of breast: Secondary | ICD-10-CM

## 2014-01-01 DIAGNOSIS — R928 Other abnormal and inconclusive findings on diagnostic imaging of breast: Secondary | ICD-10-CM

## 2014-01-19 ENCOUNTER — Ambulatory Visit
Admission: RE | Admit: 2014-01-19 | Discharge: 2014-01-19 | Disposition: A | Discharge status: Home / Self Care | Payer: BC Managed Care – PPO | Source: Ambulatory Visit | Attending: Family Medicine | Admitting: Family Medicine

## 2014-01-19 ENCOUNTER — Other Ambulatory Visit: Payer: Self-pay | Admitting: Family Medicine

## 2014-01-19 DIAGNOSIS — N63 Unspecified lump in unspecified breast: Secondary | ICD-10-CM

## 2014-01-19 DIAGNOSIS — Z853 Personal history of malignant neoplasm of breast: Secondary | ICD-10-CM

## 2014-01-19 DIAGNOSIS — R928 Other abnormal and inconclusive findings on diagnostic imaging of breast: Secondary | ICD-10-CM

## 2014-01-21 ENCOUNTER — Telehealth: Payer: Self-pay | Admitting: *Deleted

## 2014-01-21 NOTE — Telephone Encounter (Signed)
Pt called wanting an abx for a possible sinus infection and strep throat. [place] Called pt and notified her that abx require an office visit and she should go to UC in [place]. Pt voiced understanding

## 2014-02-10 ENCOUNTER — Ambulatory Visit (INDEPENDENT_AMBULATORY_CARE_PROVIDER_SITE_OTHER): Payer: BC Managed Care – PPO | Admitting: Family Medicine

## 2014-02-10 ENCOUNTER — Encounter: Payer: Self-pay | Admitting: Family Medicine

## 2014-02-10 VITALS — BP 140/87 | HR 74 | Temp 97.9°F | Wt 212.0 lb

## 2014-02-10 DIAGNOSIS — J329 Chronic sinusitis, unspecified: Secondary | ICD-10-CM

## 2014-02-10 DIAGNOSIS — B9689 Other specified bacterial agents as the cause of diseases classified elsewhere: Secondary | ICD-10-CM

## 2014-02-10 DIAGNOSIS — A499 Bacterial infection, unspecified: Secondary | ICD-10-CM

## 2014-02-10 DIAGNOSIS — E119 Type 2 diabetes mellitus without complications: Secondary | ICD-10-CM

## 2014-02-10 MED ORDER — LEVOFLOXACIN 500 MG PO TABS: 500.0000 mg | ORAL_TABLET | Freq: Every day | ORAL | Status: DC

## 2014-02-10 NOTE — Progress Notes (Signed)
CC: Kayla Gallagher is a 63 y.o. female is here for Sinusitis   Subjective: HPI:  Patient complains of facial pain localized beneath both eyes that has been present for the last week moderate in severity accompanied by thick nasal congestion and nonproductive cough. Symptoms are worse in the evening mild improvement with Tylenol Cold and sinus. Seems to be worse on a daily basis. No other interventions she has Nasonex at home but has not started it.  Accompanied by chills last night but denies fevers, flushing, shortness of breath, chest pain, nor motor or sensory disturbances  It's been almost 3 months since her A1c was checked last.  Denies polyuria polyphasia polydipsia nor polyuria and wounds continues to take Januvia and metformin daily   Review Of Systems Outlined In HPI  Past Medical History  Diagnosis Date  . Hyperlipidemia   . History of hepatitis C 01/07/2013  . Diabetes mellitus without complication   . Hypertension     Does not see cardiology  . Essential hypertension, benign 01/07/2013  . Depression   . Headache(784.0)   . History of anemia   . History of breast cancer     Past Surgical History  Procedure Laterality Date  . Abdominal hysterectomy  1976  . Breast reduction surgery    . Abdominal hysterectomy    . Tummy tuck    . Bunionectomy    . Artery biopsy Right 06/30/2013    Procedure: BIOPSY TEMPORAL ARTERY;  Surgeon: Larina Earthly, MD;  Location: [facility] OR;  Service: Vascular;  Laterality: Right;   Family History  Problem Relation Age of Onset  . Alcoholism      parents  . Prostate cancer      grandfather  . Hyperlipidemia      grandfather  . Breast cancer Sister   . Heart attack Father   . Diabetes Father     grandmother  . Cancer Father   . Heart disease Father   . Hyperlipidemia Father   . Hypertension Father   . Cancer Mother     History   Social History  . Marital Status: Divorced    Spouse Name: N/A    Number of Children: N/A  . Years of  Education: N/A   Occupational History  . Not on file.   Social History Main Topics  . Smoking status: Never Smoker   . Smokeless tobacco: Not on file  . Alcohol Use: No  . Drug Use: No  . Sexual Activity: Not Currently   Other Topics Concern  . Not on file   Social History Narrative  . No narrative on file     Objective: BP 140/87  Pulse 74  Temp(Src) 97.9 F (36.6 C) (Oral)  Wt 212 lb (96.163 kg)  General: Alert and Oriented, No Acute Distress HEENT: Pupils equal, round, reactive to light. Conjunctivae clear.  External ears unremarkable, canals clear with intact TMs with appropriate landmarks.  Middle ear appears open without effusion. Boggy erythematous inferior turbinates moderate mucoid discharge.  Moist mucous membranes, pharynx without inflammation nor lesions.  Neck supple without palpable lymphadenopathy nor abnormal masses. Lungs: Clear to auscultation bilaterally, no wheezing/ronchi/rales.  Comfortable work of breathing. Good air movement. Occasional coughing Cardiac: Regular rate and rhythm. Normal S1/S2.  No murmurs, rubs, nor gallops.   Mental Status: No depression, anxiety, nor agitation. Skin: Warm and dry.  Assessment & Plan: Shayden was seen today for sinusitis.  Diagnoses and associated orders for this visit:  Type 2  diabetes mellitus  Bacterial sinusitis - levofloxacin (LEVAQUIN) 500 MG tablet; Take 1 tablet (500 mg total) by mouth daily.    Type 2 diabetes:  Clinically controlled however Lab only f/u in one week for A1c. Bacterial sinusitis: Start levofloxacin and Nasonex   Return in about 1 week (around 02/17/2014), or if symptoms worsen or fail to improve.

## 2014-03-03 ENCOUNTER — Telehealth: Payer: Self-pay | Admitting: Family Medicine

## 2014-03-03 DIAGNOSIS — E1165 Type 2 diabetes mellitus with hyperglycemia: Secondary | ICD-10-CM

## 2014-03-03 DIAGNOSIS — IMO0002 Reserved for concepts with insufficient information to code with codable children: Secondary | ICD-10-CM

## 2014-03-03 NOTE — Telephone Encounter (Signed)
Due for a1c  

## 2014-03-04 ENCOUNTER — Encounter: Payer: Self-pay | Admitting: Family Medicine

## 2014-03-04 ENCOUNTER — Telehealth: Payer: Self-pay | Admitting: Family Medicine

## 2014-03-04 ENCOUNTER — Other Ambulatory Visit: Payer: Self-pay

## 2014-03-04 DIAGNOSIS — E119 Type 2 diabetes mellitus without complications: Secondary | ICD-10-CM

## 2014-03-04 LAB — HEMOGLOBIN A1C
Hgb A1c MFr Bld: 8.6 % — ABNORMAL HIGH (ref <5.7)
Mean Plasma Glucose: 200 mg/dL — ABNORMAL HIGH (ref <117)

## 2014-03-04 MED ORDER — DAPAGLIFLOZIN PRO-METFORMIN ER 5-1000 MG PO TB24: 1.0000 | ORAL_TABLET | Freq: Two times a day (BID) | ORAL | Status: DC

## 2014-03-04 NOTE — Telephone Encounter (Signed)
Patient has been informed of lab results as well as medication change as noted below. Rhonda Cunningham,CMA

## 2014-03-04 NOTE — Telephone Encounter (Signed)
Sue Lushndrea, Will you please let patient know that her A1c was 8.6 with a goal of less than 7.  I'd recommend she continue Januvia however change her metformin regimen to a pill that has an additional ingredient that helps the kidneys remove blood sugar.  I'd recommend she stop her metformin pill and start Xigduo which contains metformin.  Samples placed on our counter along with Rx and a savings card which should make this Rx free.  F/u three months.

## 2014-03-19 ENCOUNTER — Other Ambulatory Visit: Payer: Self-pay | Admitting: Family Medicine

## 2014-04-09 ENCOUNTER — Encounter: Payer: Self-pay | Admitting: Family Medicine

## 2014-05-31 ENCOUNTER — Other Ambulatory Visit: Payer: Self-pay | Admitting: Family Medicine

## 2014-06-01 ENCOUNTER — Ambulatory Visit (INDEPENDENT_AMBULATORY_CARE_PROVIDER_SITE_OTHER): Payer: BC Managed Care – PPO

## 2014-06-01 ENCOUNTER — Encounter: Payer: Self-pay | Admitting: Sports Medicine

## 2014-06-01 ENCOUNTER — Ambulatory Visit (INDEPENDENT_AMBULATORY_CARE_PROVIDER_SITE_OTHER): Payer: BC Managed Care – PPO | Admitting: Sports Medicine

## 2014-06-01 VITALS — BP 138/81 | HR 78 | Wt 213.0 lb

## 2014-06-01 DIAGNOSIS — S46912A Strain of unspecified muscle, fascia and tendon at shoulder and upper arm level, left arm, initial encounter: Secondary | ICD-10-CM

## 2014-06-01 DIAGNOSIS — IMO0002 Reserved for concepts with insufficient information to code with codable children: Secondary | ICD-10-CM

## 2014-06-01 DIAGNOSIS — M25529 Pain in unspecified elbow: Secondary | ICD-10-CM

## 2014-06-01 DIAGNOSIS — S56912A Strain of unspecified muscles, fascia and tendons at forearm level, left arm, initial encounter: Secondary | ICD-10-CM | POA: Insufficient documentation

## 2014-06-01 MED ORDER — HYDROCODONE-ACETAMINOPHEN 5-325 MG PO TABS: 1.0000 | ORAL_TABLET | Freq: Three times a day (TID) | ORAL | Status: DC | PRN

## 2014-06-01 MED ORDER — KETOROLAC TROMETHAMINE 30 MG/ML IJ SOLN
30.0000 mg | Freq: Once | INTRAMUSCULAR | Status: AC
  Administered 2014-06-01: 30 mg via INTRAMUSCULAR

## 2014-06-01 NOTE — Progress Notes (Signed)
   Subjective:    I'm seeing this patient as a consultation for:  Dr. Ivan AnchorsHommel  CC: Elbow pain  HPI: Patient is a 63 year old woman presenting to the clinic today with left elbow pain secondary to acute trauma. Patient hyperextended her left arm while walking her dog yesterday. Since then she has had intense pain. The pain is much worse with full flexion or extension, and rotating her forearm is very difficult for her. The pain is significant enough that holding her arm up is difficult for her. She denies any radiation, and denies burning, tingling, or numbness in the limb distal to the injury. Pain is severe, persistent.  Past medical history, Surgical history, Family history not pertinant except as noted below, Social history, Allergies, and medications have been entered into the medical record, reviewed, and no changes needed.   Review of Systems: No headache, visual changes, nausea, vomiting, diarrhea, constipation, dizziness, abdominal pain, skin rash, fevers, chills, night sweats, weight loss, swollen lymph nodes, body aches, joint swelling, muscle aches, chest pain, shortness of breath, mood changes, visual or auditory hallucinations.   Objective:   General: Well Developed, well nourished, and in no acute distress.  Neuro/Psych: Alert and oriented x3, extra-ocular muscles intact, able to move all 4 extremities, sensation grossly intact. Skin: Warm and dry, no rashes noted.  Respiratory: Not using accessory muscles, speaking in full sentences, trachea midline.  Cardiovascular: Pulses palpable, no extremity edema. Abdomen: Does not appear distended. Left Elbow: Unremarkable to inspection. Range of motion partial pronation, supination, flexion, extension. Pain with all of the above Strength is limited by pain Very tender in the proximal antebrachium on the ventral surface.   X-rays are unremarkable with the exception of a hint of a small joint effusion.  Impression and Recommendations:     Patient most likely strained her left elbow, possibly part of her brachialis. We are prescribing pain medication, an NSAID, and giving her a sling to help immobilize.

## 2014-06-01 NOTE — Assessment & Plan Note (Signed)
One day post hyper extension injury. My suspicion is with injury of the distal biceps tendon, it is not performed. Strap with compressive dressing, Toradol, hydrocodone, sling, x-rays. Return to see me in 2 weeks.

## 2014-06-04 ENCOUNTER — Ambulatory Visit: Payer: BC Managed Care – PPO | Admitting: Sports Medicine

## 2014-06-15 ENCOUNTER — Ambulatory Visit (INDEPENDENT_AMBULATORY_CARE_PROVIDER_SITE_OTHER): Payer: BC Managed Care – PPO | Admitting: Sports Medicine

## 2014-06-15 ENCOUNTER — Encounter: Payer: Self-pay | Admitting: Sports Medicine

## 2014-06-15 VITALS — BP 121/70 | HR 64 | Ht 69.0 in | Wt 213.0 lb

## 2014-06-15 DIAGNOSIS — S46912D Strain of unspecified muscle, fascia and tendon at shoulder and upper arm level, left arm, subsequent encounter: Secondary | ICD-10-CM

## 2014-06-15 DIAGNOSIS — S56912D Strain of unspecified muscles, fascia and tendons at forearm level, left arm, subsequent encounter: Secondary | ICD-10-CM

## 2014-06-15 DIAGNOSIS — IMO0002 Reserved for concepts with insufficient information to code with codable children: Secondary | ICD-10-CM

## 2014-06-15 NOTE — Progress Notes (Signed)
  Subjective:    CC: Followup  HPI: 2 weeks post hyperextension injury of the left elbow when her dog jerked her elbow while walking. Significantly better after 2 weeks of immobilization with a sling and a compression wrap.  Past medical history, Surgical history, Family history not pertinant except as noted below, Social history, Allergies, and medications have been entered into the medical record, reviewed, and no changes needed.   Review of Systems: No fevers, chills, night sweats, weight loss, chest pain, or shortness of breath.   Objective:    General: Well Developed, well nourished, and in no acute distress.  Neuro: Alert and oriented x3, extra-ocular muscles intact, sensation grossly intact.  HEENT: Normocephalic, atraumatic, pupils equal round reactive to light, neck supple, no masses, no lymphadenopathy, thyroid nonpalpable.  Skin: Warm and dry, no rashes. Cardiac: Regular rate and rhythm, no murmurs rubs or gallops, no lower extremity edema.  Respiratory: Clear to auscultation bilaterally. Not using accessory muscles, speaking in full sentences. Left Elbow: Unremarkable to inspection. Range of motion full pronation, supination, flexion, extension. Strength is full to all of the above directions Strength is good however there is reproduction of pain with resisted supination of the forearm, she also has some tenderness over the distal biceps muscle belly, she has a negative hook test. Stable to varus, valgus stress. Negative moving valgus stress test. No discrete areas of tenderness to palpation. Ulnar nerve does not sublux. Negative cubital tunnel Tinel's.  Impression and Recommendations:

## 2014-06-15 NOTE — Assessment & Plan Note (Signed)
Much better with conservative measures. This likely still represents a strain of the distal biceps tendon, and she does have reproduction of pain with resisted supination of the forearm. 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 Return in one month.

## 2014-07-14 ENCOUNTER — Encounter: Payer: Self-pay | Admitting: Sports Medicine

## 2014-07-14 ENCOUNTER — Ambulatory Visit (INDEPENDENT_AMBULATORY_CARE_PROVIDER_SITE_OTHER): Payer: BC Managed Care – PPO | Admitting: Family Medicine

## 2014-07-14 ENCOUNTER — Encounter: Payer: Self-pay | Admitting: Family Medicine

## 2014-07-14 ENCOUNTER — Ambulatory Visit (INDEPENDENT_AMBULATORY_CARE_PROVIDER_SITE_OTHER): Payer: BC Managed Care – PPO | Admitting: Sports Medicine

## 2014-07-14 VITALS — BP 157/81 | HR 81 | Ht 69.0 in | Wt 216.0 lb

## 2014-07-14 DIAGNOSIS — N61 Mastitis without abscess: Secondary | ICD-10-CM

## 2014-07-14 DIAGNOSIS — R609 Edema, unspecified: Secondary | ICD-10-CM

## 2014-07-14 DIAGNOSIS — S46912D Strain of unspecified muscle, fascia and tendon at shoulder and upper arm level, left arm, subsequent encounter: Secondary | ICD-10-CM

## 2014-07-14 DIAGNOSIS — J309 Allergic rhinitis, unspecified: Secondary | ICD-10-CM

## 2014-07-14 DIAGNOSIS — Z5189 Encounter for other specified aftercare: Secondary | ICD-10-CM

## 2014-07-14 DIAGNOSIS — IMO0002 Reserved for concepts with insufficient information to code with codable children: Secondary | ICD-10-CM

## 2014-07-14 DIAGNOSIS — E119 Type 2 diabetes mellitus without complications: Secondary | ICD-10-CM

## 2014-07-14 DIAGNOSIS — S56912D Strain of unspecified muscles, fascia and tendons at forearm level, left arm, subsequent encounter: Secondary | ICD-10-CM

## 2014-07-14 MED ORDER — CLINDAMYCIN HCL 300 MG PO CAPS: 300.0000 mg | ORAL_CAPSULE | Freq: Three times a day (TID) | ORAL | Status: DC

## 2014-07-14 MED ORDER — MONTELUKAST SODIUM 10 MG PO TABS: 10.0000 mg | ORAL_TABLET | Freq: Every day | ORAL | Status: DC

## 2014-07-14 NOTE — Assessment & Plan Note (Signed)
Overall improving. This still continues to represent the biceps muscle belly strain versus distal biceps tendon. She is healing well, and we can avoid further intervention at this point. Return to see me on an as-needed basis.

## 2014-07-14 NOTE — Progress Notes (Signed)
CC: Kayla Gallagher is a 63 y.o. female is here for No chief complaint on file.   Subjective: HPI:  Complaints of bilateral leg swelling localized from the ankles distally. Seems to be worse at the end of the day and mostly absent when she awakens in the morning. She's been going on on a daily basis for the last 2 months however for the past 3 days ever since she started taking Claritin swelling has been absent. No other changes to her medication regimen recently. When it occurs it is symmetric without any overlying skin changes. Denies edema elsewhere. Denies orthopnea, PND, nor abdominal bloating.  Complains of right breast pain has been present for the past week. It was initially accompanied by a decreased center diameter patch of redness and warmth in the 7:00 o'clock region, without any particular intervention the redness and warmth has resolved however pain persists worse to the touch described as pressure deep in the breast. Nonradiating. Denies nipple discharge or any overlying skin changes other than that described above. Denies trauma  Complains of severe nasal congestion with moderate postnasal drip occurring on a daily basis without much benefit from Claritin. No other interventions as of yet. Symptoms present for the past one to 2 weeks. Present all hours of the day. Denies cough, shortness of breath, hearing loss, or any motor or sensory disturbance   Review Of Systems Outlined In HPI  Past Medical History  Diagnosis Date  . Hyperlipidemia   . History of hepatitis C 01/07/2013  . Diabetes mellitus without complication   . Hypertension     Does not see cardiology  . Essential hypertension, benign 01/07/2013  . Depression   . Headache(784.0)   . History of anemia   . History of breast cancer     Past Surgical History  Procedure Laterality Date  . Abdominal hysterectomy  1976  . Breast reduction surgery    . Abdominal hysterectomy    . Tummy tuck    . Bunionectomy    .  Artery biopsy Right 06/30/2013    Procedure: BIOPSY TEMPORAL ARTERY;  Surgeon: Larina Earthly, MD;  Location: [facility] OR;  Service: Vascular;  Laterality: Right;   Family History  Problem Relation Age of Onset  . Alcoholism      parents  . Prostate cancer      grandfather  . Hyperlipidemia      grandfather  . Breast cancer Sister   . Heart attack Father   . Diabetes Father     grandmother  . Cancer Father   . Heart disease Father   . Hyperlipidemia Father   . Hypertension Father   . Cancer Mother     History   Social History  . Marital Status: Divorced    Spouse Name: N/A    Number of Children: N/A  . Years of Education: N/A   Occupational History  . Not on file.   Social History Main Topics  . Smoking status: Never Smoker   . Smokeless tobacco: Not on file  . Alcohol Use: No  . Drug Use: No  . Sexual Activity: Not Currently   Other Topics Concern  . Not on file   Social History Narrative  . No narrative on file     Objective: BP 157/81  Pulse 81  Ht  (1.753 m)  Wt 216 lb (97.977 kg)  BMI 31.88 kg/m2  General: Alert and Oriented, No Acute Distress HEENT: Pupils equal, round, reactive to light. Conjunctivae  clear.  External ears unremarkable, canals clear with intact TMs with appropriate landmarks.  Middle ear appears open without effusion. Boggy erythematous inferior turbinates with moderate mucoid discharge.  Moist mucous membranes, pharynx without inflammation nor lesions.  Neck supple without palpable lymphadenopathy nor abnormal masses. Lungs: Clear to auscultation bilaterally, no wheezing/ronchi/rales.  Comfortable work of breathing. Good air movement. Cardiac: Regular rate and rhythm. Normal S1/S2.  No murmurs, rubs, nor gallops.   Breast: Kayla Gallagher as chaperone) the 7:00 region of the right breast has a approximately 2 cm diameter nodular mass that is tender to the touch slightly mobile. There is no overlying skin changes here or any other gross abnormality  of the breast. No palpable lymphadenopathy in the right axilla. Extremities: No peripheral edema.  Strong peripheral pulses.  Mental Status: No depression, anxiety, nor agitation. Skin: Warm and dry.  Assessment & Plan: Kayla Gallagher was seen today for no specified reason.  Diagnoses and associated orders for this visit:  Type 2 diabetes mellitus without complication - montelukast (SINGULAIR) 10 MG tablet; Take 1 tablet (10 mg total) by mouth at bedtime. - Hemoglobin A1c - COMPLETE METABOLIC PANEL WITH GFR  Acute mastitis - clindamycin (CLEOCIN) 300 MG capsule; Take 1 capsule (300 mg total) by mouth 3 (three) times daily.  Allergic rhinitis, unspecified allergic rhinitis type - montelukast (SINGULAIR) 10 MG tablet; Take 1 tablet (10 mg total) by mouth at bedtime.  Edema - COMPLETE METABOLIC PANEL WITH GFR    Type 2 diabetes: She is well overdue for an A1c, continue current anti-hyperglycemics pending results Acute mastitis: It's reassuring that she had a normal breast biopsy at the site back in April therefore we'll treat as bacterial mastitis with clindamycin and if no benefit by 5 days of medication we'll refer for repeat imaging. Allergic rhinitis: She declines nasal steroids, start Singulair and continue Claritin Edema: Provided her with metabolic panel I like her to have submitted when the edema returns.  Low suspicion of heart failure.   Return in about 3 months (around 10/13/2014).

## 2014-07-14 NOTE — Progress Notes (Signed)
  Subjective:    CC: Followup  HPI: Left biceps strain: Continues to improve. Happy with progression so far.  Past medical history, Surgical history, Family history not pertinant except as noted below, Social history, Allergies, and medications have been entered into the medical record, reviewed, and no changes needed.   Review of Systems: No fevers, chills, night sweats, weight loss, chest pain, or shortness of breath.   Objective:    General: Well Developed, well nourished, and in no acute distress.  Neuro: Alert and oriented x3, extra-ocular muscles intact, sensation grossly intact.  HEENT: Normocephalic, atraumatic, pupils equal round reactive to light, neck supple, no masses, no lymphadenopathy, thyroid nonpalpable.  Skin: Warm and dry, no rashes. Cardiac: Regular rate and rhythm, no murmurs rubs or gallops, no lower extremity edema.  Respiratory: Clear to auscultation bilaterally. Not using accessory muscles, speaking in full sentences. Left Elbow: Unremarkable to inspection. Range of motion full pronation, supination, flexion, extension. Excellent strength, there is mild reproduction of pain over the biceps muscle belly with resisted supination. Negative hook test. Stable to varus, valgus stress. Negative moving valgus stress test. No discrete areas of tenderness to palpation. Ulnar nerve does not sublux. Negative cubital tunnel Tinel's.  Impression and Recommendations:

## 2014-07-24 ENCOUNTER — Other Ambulatory Visit: Payer: Self-pay | Admitting: Family Medicine

## 2014-08-01 LAB — COMPLETE METABOLIC PANEL WITH GFR
ALT: 20 U/L (ref 0–35)
AST: 16 U/L (ref 0–37)
Albumin: 4.3 g/dL (ref 3.5–5.2)
Alkaline Phosphatase: 74 U/L (ref 39–117)
BUN: 14 mg/dL (ref 6–23)
CALCIUM: 9.1 mg/dL (ref 8.4–10.5)
CO2: 26 meq/L (ref 19–32)
CREATININE: 0.71 mg/dL (ref 0.50–1.10)
Chloride: 100 mEq/L (ref 96–112)
GFR, Est Non African American: >89 mL/min
GLUCOSE: 115 mg/dL — AB (ref 70–99)
Potassium: 4.2 mEq/L (ref 3.5–5.3)
Sodium: 136 mEq/L (ref 135–145)
Total Bilirubin: 0.3 mg/dL (ref 0.2–1.2)
Total Protein: 7.7 g/dL (ref 6.0–8.3)

## 2014-08-01 LAB — HEMOGLOBIN A1C
Hgb A1c MFr Bld: 8.5 % — ABNORMAL HIGH (ref <5.7)
MEAN PLASMA GLUCOSE: 197 mg/dL — AB (ref <117)

## 2014-08-03 ENCOUNTER — Telehealth: Payer: Self-pay | Admitting: Family Medicine

## 2014-08-03 MED ORDER — FUROSEMIDE 20 MG PO TABS: 20.0000 mg | ORAL_TABLET | Freq: Every day | ORAL | Status: DC | PRN

## 2014-08-03 MED ORDER — GLIPIZIDE ER 5 MG PO TB24: 5.0000 mg | ORAL_TABLET | Freq: Every day | ORAL | Status: DC

## 2014-08-03 NOTE — Telephone Encounter (Signed)
Sue Lush, Will you please let patient know that her A1c was 8.5 with a goal of less than 7.  I'd recommend she start on a additional diabetic medication known as glipizide which I've [facility].  The rest of her labs showed that kidney function and liver function were normal.  I'd recommend f/u in 3 months.

## 2014-08-03 NOTE — Telephone Encounter (Signed)
Furosemide has been [facility] that she can take on an as needed basis for swelling.

## 2014-08-03 NOTE — Telephone Encounter (Signed)
Pt notified; she states her feet are still swelling and wants to know if she needs to be on a fluid pill

## 2014-08-03 NOTE — Telephone Encounter (Signed)
Called and no answer no vm

## 2014-08-04 NOTE — Telephone Encounter (Signed)
Pt.notified

## 2014-09-05 ENCOUNTER — Other Ambulatory Visit: Payer: Self-pay | Admitting: Sports Medicine

## 2014-09-07 ENCOUNTER — Other Ambulatory Visit: Payer: Self-pay | Admitting: Sports Medicine

## 2014-09-07 NOTE — Telephone Encounter (Signed)
Andrea, Rx placed in in-box ready for pickup/faxing.  

## 2014-09-10 ENCOUNTER — Ambulatory Visit: Payer: BC Managed Care – PPO | Admitting: Sports Medicine

## 2014-12-02 ENCOUNTER — Other Ambulatory Visit: Payer: Self-pay | Admitting: Family Medicine

## 2014-12-11 ENCOUNTER — Other Ambulatory Visit: Payer: Self-pay | Admitting: Family Medicine

## 2014-12-11 NOTE — Telephone Encounter (Signed)
Appointment needed before future refills 

## 2015-01-01 ENCOUNTER — Other Ambulatory Visit: Payer: Self-pay | Admitting: Family Medicine

## 2015-01-02 ENCOUNTER — Other Ambulatory Visit: Payer: Self-pay | Admitting: Family Medicine

## 2015-01-23 ENCOUNTER — Other Ambulatory Visit: Payer: Self-pay | Admitting: Family Medicine

## 2015-01-27 ENCOUNTER — Other Ambulatory Visit: Payer: Self-pay | Admitting: Family Medicine

## 2015-02-02 ENCOUNTER — Encounter: Payer: Self-pay | Admitting: Family Medicine

## 2015-02-02 ENCOUNTER — Ambulatory Visit (INDEPENDENT_AMBULATORY_CARE_PROVIDER_SITE_OTHER): Payer: BLUE CROSS/BLUE SHIELD | Admitting: Family Medicine

## 2015-02-02 VITALS — BP 174/89 | HR 64 | Ht 69.0 in | Wt 216.0 lb

## 2015-02-02 DIAGNOSIS — E119 Type 2 diabetes mellitus without complications: Secondary | ICD-10-CM

## 2015-02-02 DIAGNOSIS — I10 Essential (primary) hypertension: Secondary | ICD-10-CM | POA: Diagnosis not present

## 2015-02-02 DIAGNOSIS — F329 Major depressive disorder, single episode, unspecified: Secondary | ICD-10-CM | POA: Diagnosis not present

## 2015-02-02 DIAGNOSIS — F32A Depression, unspecified: Secondary | ICD-10-CM

## 2015-02-02 LAB — POCT GLYCOSYLATED HEMOGLOBIN (HGB A1C): Hemoglobin A1C: 8.5

## 2015-02-02 MED ORDER — SITAGLIPTIN PHOSPHATE 100 MG PO TABS: 100.0000 mg | ORAL_TABLET | Freq: Every day | ORAL | Status: DC

## 2015-02-02 MED ORDER — AMLODIPINE BESYLATE 10 MG PO TABS: ORAL_TABLET | ORAL | Status: DC

## 2015-02-02 MED ORDER — PAROXETINE HCL 20 MG PO TABS: 20.0000 mg | ORAL_TABLET | ORAL | Status: DC

## 2015-02-02 MED ORDER — LISINOPRIL 40 MG PO TABS: ORAL_TABLET | ORAL | Status: DC

## 2015-02-02 MED ORDER — METOPROLOL SUCCINATE ER 100 MG PO TB24: ORAL_TABLET | ORAL | Status: DC

## 2015-02-02 NOTE — Progress Notes (Signed)
CC: Kayla Gallagher is a 63 y.o. female is here for Follow-up   Subjective: HPI:  Follow-up essential hypertension: She has run out of metoprolol and lisinopril continues to take amlodipine on a daily basis outside blood pressures to report. no chest pain shortness of breath orthopnea nor peripheral edema.    Follow-up depression: She is requesting refills on Paxil, she tells me dry she takes this on a daily patient she denies any subjective depression or anxiety. She's very happy with life right now, currently dating. Denies any mental disturbance  Follow-up type 2 diabetes: No outside blood sugars to report. She was hoping she could stop some of her medications and therefore self discontinued xigduo and never started taking glipizide. She's been taking Januvia on a daily basis blood sugars to report. denies hypoglycemic episodes poorly healing wounds nor polyuria nor polydipsia. no formal exercise routine but tries to watch what she eats with respect to carbohydrates  Review Of Systems Outlined In HPI  Past Medical History  Diagnosis Date  . Hyperlipidemia   . History of hepatitis C 01/07/2013  . Diabetes mellitus without complication   . Hypertension     Does not see cardiology  . Essential hypertension, benign 01/07/2013  . Depression   . Headache(784.0)   . History of anemia   . History of breast cancer     Past Surgical History  Procedure Laterality Date  . Abdominal hysterectomy  1976  . Breast reduction surgery    . Abdominal hysterectomy    . Tummy tuck    . Bunionectomy    . Artery biopsy Right 06/30/2013    Procedure: BIOPSY TEMPORAL ARTERY;  Surgeon: Larina Earthly, MD;  Location: [facility] OR;  Service: Vascular;  Laterality: Right;   Family History  Problem Relation Age of Onset  . Alcoholism      parents  . Prostate cancer      grandfather  . Hyperlipidemia      grandfather  . Breast cancer Sister   . Heart attack Father   . Diabetes Father     grandmother  . Cancer  Father   . Heart disease Father   . Hyperlipidemia Father   . Hypertension Father   . Cancer Mother     History   Social History  . Marital Status: Divorced    Spouse Name: N/A  . Number of Children: N/A  . Years of Education: N/A   Occupational History  . Not on file.   Social History Main Topics  . Smoking status: Never Smoker   . Smokeless tobacco: Not on file  . Alcohol Use: No  . Drug Use: No  . Sexual Activity: Not Currently   Other Topics Concern  . Not on file   Social History Narrative     Objective: BP 174/89 mmHg  Pulse 64  Ht  (1.753 m)  Wt 216 lb (97.977 kg)  BMI 31.88 kg/m2  General: Alert and Oriented, No Acute Distress HEENT: Pupils equal, round, reactive to light. Conjunctivae clear.  Moist mucous membranes Lungs: Clear to auscultation bilaterally, no wheezing/ronchi/rales.  Comfortable work of breathing. Good air movement. Cardiac: Regular rate and rhythm. Normal S1/S2.  No murmurs, rubs, nor gallops.   Extremities: No peripheral edema.  Strong peripheral pulses.  Mental Status: No depression, anxiety, nor agitation. Skin: Warm and dry.  Assessment & Plan: Kayla Gallagher was seen today for follow-up.  Diagnoses and all orders for this visit:  Essential hypertension, benign Orders: -  lisinopril (PRINIVIL,ZESTRIL) 40 MG tablet; TAKE 1 TABLET (40 MG TOTAL) BY MOUTH 2 (TWO) TIMES DAILY -     metoprolol succinate (TOPROL-XL) 100 MG 24 hr tablet; TAKE 1 TABLET (100 MG TOTAL) BY MOUTH DAILY. TAKE WITH OR IMMEDIATELY FOLLOWING A MEAL.  Depression Orders: -     PARoxetine (PAXIL) 20 MG tablet; Take 1 tablet (20 mg total) by mouth every morning.  Type 2 diabetes mellitus without complication Orders: -     POCT HgB A1C -     sitaGLIPtin (JANUVIA) 100 MG tablet; Take 1 tablet (100 mg total) by mouth daily. -     Lipid panel  Other orders -     amLODipine (NORVASC) 10 MG tablet; One tablet by mouth every day for blood pressure control     essential hypertension: Uncontrolled restarting lisinopril and metoprolol continue amlodipine depression: Controlled continue Paxil Type 2 diabetes: Uncontrolled A1c 8.5 continue Januvia, metformin appears to be causing diarrhea which was influencing her stopping any medication containing this gradient, start glipizide that was prescribed in September. Overdue for lipid panel  Return in about 3 months (around 05/05/2015).

## 2015-02-03 ENCOUNTER — Telehealth: Payer: Self-pay | Admitting: Family Medicine

## 2015-02-03 LAB — LIPID PANEL
CHOLESTEROL: 214 mg/dL — AB (ref 0–200)
HDL: 27 mg/dL — ABNORMAL LOW (ref >=46)
LDL CALC: 134 mg/dL — AB (ref 0–99)
TRIGLYCERIDES: 264 mg/dL — AB (ref <150)
Total CHOL/HDL Ratio: 7.9 Ratio
VLDL: 53 mg/dL — AB (ref 0–40)

## 2015-02-03 MED ORDER — ATORVASTATIN CALCIUM 10 MG PO TABS: 10.0000 mg | ORAL_TABLET | Freq: Every day | ORAL | Status: DC

## 2015-02-03 NOTE — Telephone Encounter (Signed)
Kayla Gallagher, Will you please let patient know that her cholesterol was elevated to a degree that I would recommend starting a cholesterol lowering medication called atorvastatin based on current American Heart Association guidelines.  [facility]

## 2015-02-08 NOTE — Telephone Encounter (Signed)
Pt.notified

## 2015-03-20 ENCOUNTER — Other Ambulatory Visit: Payer: Self-pay | Admitting: Family Medicine

## 2015-04-27 ENCOUNTER — Ambulatory Visit (INDEPENDENT_AMBULATORY_CARE_PROVIDER_SITE_OTHER): Payer: BLUE CROSS/BLUE SHIELD | Admitting: Family Medicine

## 2015-04-27 ENCOUNTER — Encounter: Payer: Self-pay | Admitting: Family Medicine

## 2015-04-27 VITALS — BP 162/80 | HR 72 | Wt 221.0 lb

## 2015-04-27 DIAGNOSIS — F411 Generalized anxiety disorder: Secondary | ICD-10-CM

## 2015-04-27 DIAGNOSIS — I10 Essential (primary) hypertension: Secondary | ICD-10-CM | POA: Diagnosis not present

## 2015-04-27 MED ORDER — METOPROLOL SUCCINATE ER 100 MG PO TB24
ORAL_TABLET | ORAL | Status: DC
Start: 2015-04-27 — End: 2015-11-03

## 2015-04-27 MED ORDER — ALPRAZOLAM 0.5 MG PO TABS: 0.5000 mg | ORAL_TABLET | Freq: Two times a day (BID) | ORAL | Status: DC | PRN

## 2015-04-27 NOTE — Progress Notes (Signed)
CC: Kayla Gallagher is a 64 y.o. female is here for Hypertension   Subjective: HPI:  Getting married in July  Had her blood pressure checked at an outside clinic and it was around 180/110. She states has been no change to medication, diet or exercise routine. She states that she feels stressed recently due to an upcoming wedding but otherwise feels like her normal self. Denies chest pain shortness of breath orthopnea peripheral edema nor headache.  Complains of a sense of nervousness and subjective anxiety that has been present for the past couple weeks getting worse the more she thinks about and has to plan for her wedding. She is looking forward to the wedding but logistics are causing some anxiety. Symptoms are worsened by driving over 1 hour for commuting to work and with additional stress put on by work demands. Symptoms are moderate in severity and occasionally cause an upset stomach. Anxiety seems to be distracting her to a moderate degree at home and at work.   Review Of Systems Outlined In HPI  Past Medical History  Diagnosis Date  . Hyperlipidemia   . History of hepatitis C 01/07/2013  . Diabetes mellitus without complication   . Hypertension     Does not see cardiology  . Essential hypertension, benign 01/07/2013  . Depression   . Headache(784.0)   . History of anemia   . History of breast cancer     Past Surgical History  Procedure Laterality Date  . Abdominal hysterectomy  1976  . Breast reduction surgery    . Abdominal hysterectomy    . Tummy tuck    . Bunionectomy    . Artery biopsy Right 06/30/2013    Procedure: BIOPSY TEMPORAL ARTERY;  Surgeon: Larina Earthly, MD;  Location: [facility] OR;  Service: Vascular;  Laterality: Right;   Family History  Problem Relation Age of Onset  . Alcoholism      parents  . Prostate cancer      grandfather  . Hyperlipidemia      grandfather  . Breast cancer Sister   . Heart attack Father   . Diabetes Father     grandmother  . Cancer  Father   . Heart disease Father   . Hyperlipidemia Father   . Hypertension Father   . Cancer Mother     History   Social History  . Marital Status: Divorced    Spouse Name: N/A  . Number of Children: N/A  . Years of Education: N/A   Occupational History  . Not on file.   Social History Main Topics  . Smoking status: Never Smoker   . Smokeless tobacco: Not on file  . Alcohol Use: No  . Drug Use: No  . Sexual Activity: Not Currently   Other Topics Concern  . Not on file   Social History Narrative     Objective: BP 162/80 mmHg  Pulse 72  Wt 221 lb (100.245 kg)  Vital signs reviewed. General: Alert and Oriented, No Acute Distress HEENT: Pupils equal, round, reactive to light. Conjunctivae clear.  External ears unremarkable.  Moist mucous membranes. Lungs: Clear and comfortable work of breathing, speaking in full sentences without accessory muscle use. Cardiac: Regular rate and rhythm.  Neuro: CN II-XII grossly intact, gait normal. Extremities: No peripheral edema.  Strong peripheral pulses.  Mental Status: No depression, anxiety, nor agitation. Logical though process. Skin: Warm and dry.  Assessment & Plan: Kayla Gallagher was seen today for hypertension.  Diagnoses and all orders  for this visit:  Essential hypertension, benign Orders: -     metoprolol succinate (TOPROL-XL) 100 MG 24 hr tablet; TAKE 2 TABLET (200 MG TOTAL) BY MOUTH DAILY. TAKE WITH OR IMMEDIATELY FOLLOWING A MEAL.  Generalized anxiety disorder  Other orders -     ALPRAZolam (XANAX) 0.5 MG tablet; Take 1 tablet (0.5 mg total) by mouth 2 (two) times daily as needed for anxiety.   Essential hypertension: Uncontrolled increasing metoprolol, and asked her to purchase a home blood pressure cuff and record readings on a daily basis for the next week and drop it off for my review. If she remains hypertensive can always restart hydrochlorothiazide. Generalized anxiety disorder: Heavily influenced by her  upcoming wedding, this is likely to be temporary between now in July. She tells me she is tolerated Xanax for similar symptoms in the past and since she's getting waves of these sensations of anxiety provided her with as needed use of Xanax but encouraged not to take on a daily basis.  Return if symptoms worsen or fail to improve.

## 2015-05-04 ENCOUNTER — Encounter: Payer: Self-pay | Admitting: Family Medicine

## 2015-05-04 ENCOUNTER — Ambulatory Visit (INDEPENDENT_AMBULATORY_CARE_PROVIDER_SITE_OTHER): Payer: BLUE CROSS/BLUE SHIELD | Admitting: Family Medicine

## 2015-05-04 VITALS — BP 151/87 | HR 77 | Wt 223.0 lb

## 2015-05-04 DIAGNOSIS — I10 Essential (primary) hypertension: Secondary | ICD-10-CM

## 2015-05-04 DIAGNOSIS — F411 Generalized anxiety disorder: Secondary | ICD-10-CM | POA: Diagnosis not present

## 2015-05-04 MED ORDER — HYDROCHLOROTHIAZIDE 25 MG PO TABS: ORAL_TABLET | ORAL | Status: DC

## 2015-05-04 MED ORDER — LISINOPRIL 40 MG PO TABS: ORAL_TABLET | ORAL | Status: DC

## 2015-05-04 MED ORDER — AMLODIPINE BESYLATE 10 MG PO TABS: ORAL_TABLET | ORAL | Status: DC

## 2015-05-04 NOTE — Progress Notes (Signed)
CC: Kayla Gallagher is a 64 y.o. female is here for Hypertension   Subjective: HPI:  Follow-up essential hypertension: Since I saw her last she began taking metoprolol 200 mg daily. She denies any known side effects. She's noted that her blood pressure has remained in the stage II hypertension range so she began taking 2 amlodipine a day as well. This did not seem to make any difference with how she fell or what her blood pressures. She denies chest pain shortness of breath orthopnea peripheral edema nor motor or sensory disturbances. She's noticed that blood pressure remains elevated all hours of the day. Nothing particularly makes it better or worse that she is aware of.  She would like to know if she could be written out of work or should she go on vacation for a week. She feels like she is on the edge of being burnt out with her work schedule. She is working 6 or 7 days a week and 10-11 hour days. She feels exhausted at the end of the day and anxious throughout the day due to increased work demands. She is not taking a vacation in quite a while. She denies any depression but just feels like she is wound up and nervous about anything and everything. No issues with sleep disturbance, appetite suppression, change in appetite nor loss of interest in hobbies.   Review Of Systems Outlined In HPI  Past Medical History  Diagnosis Date  . Hyperlipidemia   . History of hepatitis C 01/07/2013  . Diabetes mellitus without complication   . Hypertension     Does not see cardiology  . Essential hypertension, benign 01/07/2013  . Depression   . Headache(784.0)   . History of anemia   . History of breast cancer     Past Surgical History  Procedure Laterality Date  . Abdominal hysterectomy  1976  . Breast reduction surgery    . Abdominal hysterectomy    . Tummy tuck    . Bunionectomy    . Artery biopsy Right 06/30/2013    Procedure: BIOPSY TEMPORAL ARTERY;  Surgeon: Larina Earthly, MD;  Location: [facility] OR;   Service: Vascular;  Laterality: Right;   Family History  Problem Relation Age of Onset  . Alcoholism      parents  . Prostate cancer      grandfather  . Hyperlipidemia      grandfather  . Breast cancer Sister   . Heart attack Father   . Diabetes Father     grandmother  . Cancer Father   . Heart disease Father   . Hyperlipidemia Father   . Hypertension Father   . Cancer Mother     History   Social History  . Marital Status: Divorced    Spouse Name: N/A  . Number of Children: N/A  . Years of Education: N/A   Occupational History  . Not on file.   Social History Main Topics  . Smoking status: Never Smoker   . Smokeless tobacco: Not on file  . Alcohol Use: No  . Drug Use: No  . Sexual Activity: Not Currently   Other Topics Concern  . Not on file   Social History Narrative     Objective: BP 151/87 mmHg  Pulse 77  Wt 223 lb (101.152 kg)  Vital signs reviewed. General: Alert and Oriented, No Acute Distress HEENT: Pupils equal, round, reactive to light. Conjunctivae clear.  External ears unremarkable.  Moist mucous membranes. Lungs: Clear and comfortable  work of breathing, speaking in full sentences without accessory muscle use. Cardiac: Regular rate and rhythm.  Neuro: CN II-XII grossly intact, gait normal. Extremities: No peripheral edema.  Strong peripheral pulses.  Mental Status: No depression, anxiety, nor agitation. Seems somewhat frustrated when discussing work situation. Logical though process. Skin: Warm and dry.  Assessment & Plan: Marcene was seen today for hypertension.  Diagnoses and all orders for this visit:  Essential hypertension, benign Orders: -     lisinopril (PRINIVIL,ZESTRIL) 40 MG tablet; TAKE 1 TABLET (40 MG TOTAL) BY MOUTH 2 (TWO) TIMES DAILY -     amLODipine (NORVASC) 10 MG tablet; One tablet by mouth every day for blood pressure control -     hydrochlorothiazide (HYDRODIURIL) 25 MG tablet; One tablet by mouth every morning for  blood pressure control.  Anxiety state   Essential hypertension: Uncontrolled chronic condition, advised to only take 1 amlodipine 10 mg tablet a day. May continue to take lisinopril twice a day and 200 mg of metoprolol daily. She is to be on hydrochlorothiazide and the tumor was accompanied the conclusion that she probably stopped asking for refills on this medication with her former provider which is why she never continued on the medication. She is open to the idea of restarting it today Anxiety: Offered to write her out of work however discussed that she is probably not getting paid for this. That she misses work since she does not have any short-term disability opportunities. Joint decision for her to take a vacation for the next week.  Return in about 3 months (around 08/04/2015). b

## 2015-05-05 ENCOUNTER — Telehealth: Payer: Self-pay | Admitting: Family Medicine

## 2015-05-05 NOTE — Telephone Encounter (Signed)
[employer] sent me short term disability paperwork for this patient.  Can she please fill out "Section I" and sign/date. Then I'll fill out the rest. Can she also confirm that this is due to feeling stressed out and overwhelmed lately? Papers in your in box.

## 2015-05-05 NOTE — Telephone Encounter (Signed)
Pt is coming by tomorrow to fill out section 1. Pt is taking one week off starting tomorrow due to "anxiety and exhaustion"

## 2015-05-10 ENCOUNTER — Telehealth: Payer: Self-pay | Admitting: *Deleted

## 2015-05-10 DIAGNOSIS — R0681 Apnea, not elsewhere classified: Secondary | ICD-10-CM

## 2015-05-10 DIAGNOSIS — R5383 Other fatigue: Secondary | ICD-10-CM

## 2015-05-10 NOTE — Telephone Encounter (Signed)
Can she give me a list of symptoms that are prompting this request, I think if if have that documented her insurance should cover a test. I'll place an order once I know her symptoms.

## 2015-05-10 NOTE — Telephone Encounter (Signed)
Pt is requesting an order placed for a sleep study. I don't see where this was ever discussed or a dx on problem list. The facility that does the sleep study may request  Office notes. She states she has talked to you about this in the past. Please advise if you want to go ahead and order or if you would like for her to come in.

## 2015-05-11 ENCOUNTER — Telehealth: Payer: Self-pay | Admitting: Family Medicine

## 2015-05-11 NOTE — Telephone Encounter (Signed)
Pt wakes up dizzy,snores loud,fiance' says she stops breathing. sxs x a few years

## 2015-05-11 NOTE — Telephone Encounter (Signed)
Kayla Gallagher, Will you please let patient know that her BP numbers have come down nicely.  Keep up the current BP medication regimen.

## 2015-05-11 NOTE — Telephone Encounter (Signed)
Pt.notified

## 2015-05-18 ENCOUNTER — Telehealth: Payer: Self-pay | Admitting: *Deleted

## 2015-05-19 NOTE — Telephone Encounter (Signed)
Pt is inquiring about sleep study referral. I see a home sleep study order placed. Can you check on this please?

## 2015-05-21 NOTE — Telephone Encounter (Signed)
I called [facility] at [phone] [facility] and ref[facility]ed order to [phone]

## 2015-05-21 NOTE — Telephone Encounter (Signed)
Ok as long as the patient is aware of the hold up

## 2015-06-07 ENCOUNTER — Other Ambulatory Visit: Payer: Self-pay | Admitting: Family Medicine

## 2015-06-24 ENCOUNTER — Encounter: Payer: Self-pay | Admitting: Family Medicine

## 2015-06-24 ENCOUNTER — Ambulatory Visit (INDEPENDENT_AMBULATORY_CARE_PROVIDER_SITE_OTHER): Payer: BLUE CROSS/BLUE SHIELD | Admitting: Family Medicine

## 2015-06-24 VITALS — BP 130/82 | HR 80 | Ht 69.0 in | Wt 217.0 lb

## 2015-06-24 DIAGNOSIS — E119 Type 2 diabetes mellitus without complications: Secondary | ICD-10-CM | POA: Diagnosis not present

## 2015-06-24 DIAGNOSIS — Z22322 Carrier or suspected carrier of Methicillin resistant Staphylococcus aureus: Secondary | ICD-10-CM | POA: Diagnosis not present

## 2015-06-24 DIAGNOSIS — E669 Obesity, unspecified: Secondary | ICD-10-CM

## 2015-06-24 DIAGNOSIS — I10 Essential (primary) hypertension: Secondary | ICD-10-CM | POA: Diagnosis not present

## 2015-06-24 LAB — POCT UA - MICROALBUMIN
Creatinine, POC: 200 mg/dL
Microalbumin Ur, POC: 150 mg/L

## 2015-06-24 LAB — POCT GLYCOSYLATED HEMOGLOBIN (HGB A1C): Hemoglobin A1C: 8.9

## 2015-06-24 MED ORDER — SULFAMETHOXAZOLE-TRIMETHOPRIM 800-160 MG PO TABS: 1.0000 | ORAL_TABLET | Freq: Two times a day (BID) | ORAL | Status: DC

## 2015-06-24 NOTE — Progress Notes (Signed)
CC: Kayla Gallagher is a 64 y.o. female is here for Diabetes   Subjective: HPI:  Follow-up type 2 diabetes: Currently taking Januvia, glipizide on a daily basis with no outside blood sugars to report. No polyuria or polyphagia or polydipsia. Denies vision loss. Denies hypoglycemic episodes.  Follow-up essential hypertension: Continues on hydrochlorothiazide, lisinopril, metoprolol on a daily basis with no outside blood pressures to report. Denies chest pain shortness of breath orthopnea or peripheral edema.  She's got a rash on her chest that seems to be spreading it was originally just wanted to however now represents multiple shallow ulcers ranging from 1-5 millimeters in diameter. She denies fevers, chills, nor nausea. She admits that these are somewhat painful but mostly itchy. They originally occurred at hair follicles and seemed to be spreading peripherally.  She would like to know if I can get her more information about gastric bypass.   Review Of Systems Outlined In HPI  Past Medical History  Diagnosis Date  . Hyperlipidemia   . History of hepatitis C 01/07/2013  . Diabetes mellitus without complication   . Hypertension     Does not see cardiology  . Essential hypertension, benign 01/07/2013  . Depression   . Headache(784.0)   . History of anemia   . History of breast cancer     Past Surgical History  Procedure Laterality Date  . Abdominal hysterectomy  1976  . Breast reduction surgery    . Abdominal hysterectomy    . Tummy tuck    . Bunionectomy    . Artery biopsy Right 06/30/2013    Procedure: BIOPSY TEMPORAL ARTERY;  Surgeon: Larina Earthly, MD;  Location: [facility] OR;  Service: Vascular;  Laterality: Right;   Family History  Problem Relation Age of Onset  . Alcoholism      parents  . Prostate cancer      grandfather  . Hyperlipidemia      grandfather  . Breast cancer Sister   . Heart attack Father   . Diabetes Father     grandmother  . Cancer Father   . Heart  disease Father   . Hyperlipidemia Father   . Hypertension Father   . Cancer Mother     Social History   Social History  . Marital Status: Married    Spouse Name: N/A  . Number of Children: N/A  . Years of Education: N/A   Occupational History  . Not on file.   Social History Main Topics  . Smoking status: Never Smoker   . Smokeless tobacco: Not on file  . Alcohol Use: No  . Drug Use: No  . Sexual Activity: Not Currently   Other Topics Concern  . Not on file   Social History Narrative     Objective: BP 130/82 mmHg  Pulse 80  Ht  (1.753 m)  Wt 217 lb (98.431 kg)  BMI 32.03 kg/m2  Vital signs reviewed. General: Alert and Oriented, No Acute Distress HEENT: Pupils equal, round, reactive to light. Conjunctivae clear.  External ears unremarkable.  Moist mucous membranes. Lungs: Clear and comfortable work of breathing, speaking in full sentences without accessory muscle use. Cardiac: Regular rate and rhythm.  Neuro: CN II-XII grossly intact, gait normal. Extremities: No peripheral edema.  Strong peripheral pulses.  Mental Status: No depression, anxiety, nor agitation. Logical though process. Skin: Warm and dry. Extremity shallow ulcerations with central pustules on the chest, approximately 10 or 11 total.  Assessment & Plan: Tryphena was seen today  for diabetes.  Diagnoses and all orders for this visit:  Type 2 diabetes mellitus without complication -     POCT HgB A1C -     POCT UA - Microalbumin  Essential hypertension, benign  MRSA (methicillin resistant staph aureus) culture positive  Obesity -     Ambulatory referral to Family Practice  Other orders -     sulfamethoxazole-trimethoprim (BACTRIM DS,SEPTRA DS) 800-160 MG per tablet; Take 1 tablet by mouth 2 (two) times daily.   Type 2 diabetes: A1c 8.9, uncontrolled, adding back metformin continue all other anti-hyperglycemics Essential hypertension: Controlled continue metoprolol, HCTZ and lisinopril  especially in the setting of microalbuminuria MRSA: Mild case of the chest, start Bactrim Obesity: Referral to bariatric further information about surgical solutions to weight loss.    Return in about 3 months (around 09/24/2015).

## 2015-07-16 ENCOUNTER — Telehealth: Payer: Self-pay | Admitting: Family Medicine

## 2015-07-16 NOTE — Telephone Encounter (Signed)
BDC means [facility] and if a patient decided they wanted to have bariatric surgery the facility would have to be a this specific type of center. It has nothing to do with [place] or referral

## 2015-07-16 NOTE — Telephone Encounter (Signed)
Sue Lush, Will you please call Dr. Ovidio Kin office and see if they can clarify a note sent to me by Sandie Ano who is associated with their office somehow.  It states that a "BDC Is required" for this patient's referral.  Nobody in our office knows what BDC stands for.

## 2015-07-25 ENCOUNTER — Other Ambulatory Visit: Payer: Self-pay | Admitting: Family Medicine

## 2015-07-30 ENCOUNTER — Telehealth: Payer: Self-pay | Admitting: *Deleted

## 2015-07-30 MED ORDER — CLINDAMYCIN HCL 300 MG PO CAPS: 300.0000 mg | ORAL_CAPSULE | Freq: Three times a day (TID) | ORAL | Status: DC

## 2015-07-30 NOTE — Telephone Encounter (Signed)
Left message on vm

## 2015-07-30 NOTE — Telephone Encounter (Signed)
Pt left a message that the antibiotic prescribed did not help at all. She was last seen back in aug with a mild case of MRSA and this was when this medication was prescribed.Pt is requesting another abx.Since it has been a month and the medication did nothing do you feel she needs to be seen again?

## 2015-07-30 NOTE — Telephone Encounter (Signed)
I've sent a [facility] and if this does not help then an office visit is necessary to get a look at it and possibly a wound culture.

## 2015-08-16 ENCOUNTER — Other Ambulatory Visit: Payer: Self-pay | Admitting: Family Medicine

## 2015-09-01 ENCOUNTER — Other Ambulatory Visit: Payer: Self-pay | Admitting: Family Medicine

## 2015-09-06 ENCOUNTER — Ambulatory Visit (INDEPENDENT_AMBULATORY_CARE_PROVIDER_SITE_OTHER): Payer: BLUE CROSS/BLUE SHIELD | Admitting: Family Medicine

## 2015-09-06 ENCOUNTER — Encounter: Payer: Self-pay | Admitting: Family Medicine

## 2015-09-06 VITALS — BP 156/90 | HR 68 | Wt 214.0 lb

## 2015-09-06 DIAGNOSIS — I10 Essential (primary) hypertension: Secondary | ICD-10-CM | POA: Diagnosis not present

## 2015-09-06 DIAGNOSIS — F329 Major depressive disorder, single episode, unspecified: Secondary | ICD-10-CM | POA: Diagnosis not present

## 2015-09-06 DIAGNOSIS — F32A Depression, unspecified: Secondary | ICD-10-CM

## 2015-09-06 MED ORDER — PAROXETINE HCL 40 MG PO TABS: 40.0000 mg | ORAL_TABLET | ORAL | Status: DC

## 2015-09-06 NOTE — Progress Notes (Signed)
CC: Kayla MeuseDeborah A Gallagher is a 64 y.o. female is here for Hypertension and Depression   Subjective: HPI:   subjective depression  Along with tearfulness, irritability that has been present for the last week. Symptoms began when her daughter moved out of the state, which is a good thing for the daughter but causes sadness for the patient. Symptoms are interfering with ability to work. Symptoms are moderate to severe severity but not accompanied by any thoughts of wanting to harm self or others. She currently is taking Paxil on a daily basis and has been doing so for decades ever since a mental breakdown following similar symptoms above. She denies paranoia or hallucinations.  Follow-up essential hypertension: She's been checking her blood pressure at home. If she is anxious or depressed ill be about 140/90 but only barely, if she is relaxed and thinking happy thoughts it'll be in the normotensive range. She's been more anxious and depressed than she is used to. She denies chest pain shortness of breath orthopnea nor peripheral edema  Review Of Systems Outlined In HPI  Past Medical History  Diagnosis Date  . Hyperlipidemia   . History of hepatitis C 01/07/2013  . Diabetes mellitus without complication (HCC)   . Hypertension     Does not see cardiology  . Essential hypertension, benign 01/07/2013  . Depression   . Headache(784.0)   . History of anemia   . History of breast cancer     Past Surgical History  Procedure Laterality Date  . Abdominal hysterectomy  1976  . Breast reduction surgery    . Abdominal hysterectomy    . Tummy tuck    . Bunionectomy    . Artery biopsy Right 06/30/2013    Procedure: BIOPSY TEMPORAL ARTERY;  Surgeon: Larina Earthlyodd F Early, MD;  [facility];  Service: Vascular;  Laterality: Right;   Family History  Problem Relation Age of Onset  . Alcoholism      parents  . Prostate cancer      grandfather  . Hyperlipidemia      grandfather  . Breast cancer Sister   .  Heart attack Father   . Diabetes Father     grandmother  . Cancer Father   . Heart disease Father   . Hyperlipidemia Father   . Hypertension Father   . Cancer Mother     Social History   Social History  . Marital Status: Married    Spouse Name: N/A  . Number of Children: N/A  . Years of Education: N/A   Occupational History  . Not on file.   Social History Main Topics  . Smoking status: Never Smoker   . Smokeless tobacco: Not on file  . Alcohol Use: No  . Drug Use: No  . Sexual Activity: Not Currently   Other Topics Concern  . Not on file   Social History Narrative     Objective: BP 156/90 mmHg  Pulse 68  Wt 214 lb (97.07 kg)  Vital signs reviewed. General: Alert and Oriented, No Acute Distress HEENT: Pupils equal, round, reactive to light. Conjunctivae clear.  External ears unremarkable.  Moist mucous membranes. Lungs: Clear and comfortable work of breathing, speaking in full sentences without accessory muscle use. Cardiac: Regular rate and rhythm.  Neuro: CN II-XII grossly intact, gait normal. Extremities: No peripheral edema.  Strong peripheral pulses.  Mental Status: mild depression, tearful. No anxiety, nor agitation. Logical though process. Skin: Warm and dry.  Assessment & Plan: Kayla PoundDeborah was seen  today for hypertension and depression.  Diagnoses and all orders for this visit:  Essential hypertension, benign  Depression -     PARoxetine (PAXIL) 40 MG tablet; Take 1 tablet (40 mg total) by mouth every morning.   Essential hypertension: Uncontrolled chronic condition that is heavily influenced by her mood therefore focusing on improving her depression and tearfulness with starting higher dose of proximal team. Recommended 2 weeks off of work given that symptoms that clear what she is experiencing now led to a psychiatric hospitalization in the past.    Return in about 2 weeks (around 09/20/2015).

## 2015-09-09 ENCOUNTER — Encounter: Payer: Self-pay | Admitting: Family Medicine

## 2015-09-09 DIAGNOSIS — E669 Obesity, unspecified: Secondary | ICD-10-CM | POA: Insufficient documentation

## 2015-09-24 ENCOUNTER — Encounter: Payer: Self-pay | Admitting: Family Medicine

## 2015-09-24 ENCOUNTER — Ambulatory Visit (INDEPENDENT_AMBULATORY_CARE_PROVIDER_SITE_OTHER): Payer: BLUE CROSS/BLUE SHIELD | Admitting: Family Medicine

## 2015-09-24 VITALS — BP 127/78 | HR 73 | Wt 213.0 lb

## 2015-09-24 DIAGNOSIS — F329 Major depressive disorder, single episode, unspecified: Secondary | ICD-10-CM

## 2015-09-24 DIAGNOSIS — E119 Type 2 diabetes mellitus without complications: Secondary | ICD-10-CM | POA: Diagnosis not present

## 2015-09-24 DIAGNOSIS — I10 Essential (primary) hypertension: Secondary | ICD-10-CM

## 2015-09-24 DIAGNOSIS — E118 Type 2 diabetes mellitus with unspecified complications: Secondary | ICD-10-CM

## 2015-09-24 DIAGNOSIS — F32A Depression, unspecified: Secondary | ICD-10-CM

## 2015-09-24 LAB — POCT GLYCOSYLATED HEMOGLOBIN (HGB A1C): Hemoglobin A1C: 9.6

## 2015-09-24 MED ORDER — SITAGLIP PHOS-METFORMIN HCL ER 50-1000 MG PO TB24: 2.0000 | ORAL_TABLET | Freq: Every day | ORAL | Status: DC

## 2015-09-24 NOTE — Progress Notes (Signed)
CC: Kayla MeuseDeborah A Gallagher is a 64 y.o. female is here for Hyperglycemia   Subjective: HPI:  Follow-up depression: Since increasing Paxil she's noticed a big improvement with not being so pessimistic and no longer feeling depressed. She denies any thoughts of wanting to harm self or others. She denies any anxiety or any other mental disturbance.  Follow-up essential hypertension: Continues to take amlodipine, hydrochlorothiazide, lisinopril and metoprolol. No outside blood pressures to report. She denies chest pain shortness of breath orthopnea nor peripheral edema  Follow-up type 2 diabetes: Since I saw her last she was prescribed a new regimen of metformin, she tells me that she is a little less than 50% compliant with taking this medication due to forgetfulness. She is taking Januvia on a daily basis. She denies any polyuria polyphagia or polydipsia. No poorly healing wounds. No outside blood sugars to report.   Review Of Systems Outlined In HPI  Past Medical History  Diagnosis Date  . Hyperlipidemia   . History of hepatitis C 01/07/2013  . Diabetes mellitus without complication (HCC)   . Hypertension     Does not see cardiology  . Essential hypertension, benign 01/07/2013  . Depression   . Headache(784.0)   . History of anemia   . History of breast cancer     Past Surgical History  Procedure Laterality Date  . Abdominal hysterectomy  1976  . Breast reduction surgery    . Abdominal hysterectomy    . Tummy tuck    . Bunionectomy    . Artery biopsy Right 06/30/2013    Procedure: BIOPSY TEMPORAL ARTERY;  Surgeon: Larina Earthlyodd F Early, MD;  [facility];  Service: Vascular;  Laterality: Right;   Family History  Problem Relation Age of Onset  . Alcoholism      parents  . Prostate cancer      grandfather  . Hyperlipidemia      grandfather  . Breast cancer Sister   . Heart attack Father   . Diabetes Father     grandmother  . Cancer Father   . Heart disease Father   . Hyperlipidemia  Father   . Hypertension Father   . Cancer Mother     Social History   Social History  . Marital Status: Married    Spouse Name: N/A  . Number of Children: N/A  . Years of Education: N/A   Occupational History  . Not on file.   Social History Main Topics  . Smoking status: Never Smoker   . Smokeless tobacco: Not on file  . Alcohol Use: No  . Drug Use: No  . Sexual Activity: Not Currently   Other Topics Concern  . Not on file   Social History Narrative     Objective: BP 127/78 mmHg  Pulse 73  Wt 213 lb (96.616 kg)  General: Alert and Oriented, No Acute Distress HEENT: Pupils equal, round, reactive to light. Conjunctivae clear.   Lungs: Clear to auscultation bilaterally, no wheezing/ronchi/rales.  Comfortable work of breathing. Good air movement. Cardiac: Regular rate and rhythm. Normal S1/S2.  No murmurs, rubs, nor gallops.   Extremities: No peripheral edema.  Strong peripheral pulses.  Mental Status: No depression, anxiety, nor agitation. Skin: Warm and dry.  Assessment & Plan: Kayla PoundDeborah was seen today for hyperglycemia.  Diagnoses and all orders for this visit:  Type 2 diabetes mellitus with complication, without long-term current use of insulin (HCC) -     POCT HgB A1C  Depression  Essential hypertension, benign  Type 2 diabetes mellitus without complication, without long-term current use of insulin (HCC)  Other orders -     SitaGLIPtin-MetFORMIN HCl (JANUMET XR) 50-1000 MG TB24; Take 2 tablets by mouth daily. For blood sugar control.   Type 2 diabetes: Uncontrolled chronic condition she believes that compliance will be much more achievable with JanumetXR incentive Januvia and metformin split.  Depression: Controlled with Paxil Essential hypertension: Controlled continue current antihypertensive regimen  Return in about 3 months (around 12/25/2015).

## 2015-10-06 ENCOUNTER — Encounter: Payer: Self-pay | Admitting: Osteopathic Medicine

## 2015-10-06 ENCOUNTER — Ambulatory Visit (INDEPENDENT_AMBULATORY_CARE_PROVIDER_SITE_OTHER): Payer: BLUE CROSS/BLUE SHIELD | Admitting: Osteopathic Medicine

## 2015-10-06 VITALS — BP 135/85 | HR 63 | Temp 98.2°F | Ht 69.0 in | Wt 212.0 lb

## 2015-10-06 DIAGNOSIS — R809 Proteinuria, unspecified: Secondary | ICD-10-CM | POA: Diagnosis not present

## 2015-10-06 DIAGNOSIS — H15002 Unspecified scleritis, left eye: Secondary | ICD-10-CM

## 2015-10-06 LAB — COMPLETE METABOLIC PANEL WITH GFR
ALBUMIN: 4.3 g/dL (ref 3.6–5.1)
ALK PHOS: 68 U/L (ref 33–130)
ALT: 29 U/L (ref 6–29)
AST: 22 U/L (ref 10–35)
BILIRUBIN TOTAL: 0.5 mg/dL (ref 0.2–1.2)
BUN: 12 mg/dL (ref 7–25)
CO2: 28 mmol/L (ref 20–31)
Calcium: 10 mg/dL (ref 8.6–10.4)
Chloride: 98 mmol/L (ref 98–110)
Creat: 0.81 mg/dL (ref 0.50–0.99)
GFR, EST AFRICAN AMERICAN: 89 mL/min (ref >=60)
GFR, EST NON AFRICAN AMERICAN: 77 mL/min (ref >=60)
GLUCOSE: 146 mg/dL — AB (ref 65–99)
POTASSIUM: 4.7 mmol/L (ref 3.5–5.3)
SODIUM: 136 mmol/L (ref 135–146)
TOTAL PROTEIN: 7.8 g/dL (ref 6.1–8.1)

## 2015-10-06 LAB — CBC WITH DIFFERENTIAL/PLATELET
BASOS PCT: 0 % (ref 0–1)
Basophils Absolute: 0 10*3/uL (ref 0.0–0.1)
EOS ABS: 0.1 10*3/uL (ref 0.0–0.7)
Eosinophils Relative: 1 % (ref 0–5)
HCT: 39 % (ref 36.0–46.0)
HEMOGLOBIN: 13.7 g/dL (ref 12.0–15.0)
LYMPHS ABS: 2 10*3/uL (ref 0.7–4.0)
Lymphocytes Relative: 21 % (ref 12–46)
MCH: 30.3 pg (ref 26.0–34.0)
MCHC: 35.1 g/dL (ref 30.0–36.0)
MCV: 86.3 fL (ref 78.0–100.0)
MONO ABS: 0.8 10*3/uL (ref 0.1–1.0)
MONOS PCT: 8 % (ref 3–12)
MPV: 11 fL (ref 8.6–12.4)
NEUTROS ABS: 6.8 10*3/uL (ref 1.7–7.7)
NEUTROS PCT: 70 % (ref 43–77)
Platelets: 260 10*3/uL (ref 150–400)
RBC: 4.52 MIL/uL (ref 3.87–5.11)
RDW: 13.2 % (ref 11.5–15.5)
WBC: 9.7 10*3/uL (ref 4.0–10.5)

## 2015-10-06 LAB — RHEUMATOID FACTOR

## 2015-10-06 MED ORDER — IBUPROFEN 600 MG PO TABS: 600.0000 mg | ORAL_TABLET | Freq: Three times a day (TID) | ORAL | Status: DC

## 2015-10-06 MED ORDER — PREDNISONE 10 MG (48) PO TBPK: ORAL_TABLET | ORAL | Status: DC

## 2015-10-06 NOTE — Progress Notes (Signed)
HPI: Kayla Gallagher is a 64 y.o. female who presents to [facility]  today for chief complaint of:  Chief Complaint  Patient presents with  . Eye Problem    LEFT EYE RED   . Location: L medial eye . Quality: redness, burst blood vessel, no pain . Duration: 2 days . Context: has happened few times per year past few years in different eyes, no vision changes. Saw optometrist yesterday who recommended medical evaluation for other causes red eye . Modifying factors: eye drops/lubricants, not using Visine, on no bloodthinners . Assoc signs/symptoms: no vision changes or hearing changes, mild headache, hx HTN but bp ok today, no other easy bleeding or bruising   Past medical, social and family history reviewed: Past Medical History  Diagnosis Date  . Hyperlipidemia   . History of hepatitis C 01/07/2013  . Diabetes mellitus without complication (HCC)   . Hypertension     Does not see cardiology  . Essential hypertension, benign 01/07/2013  . Depression   . Headache(784.0)   . History of anemia   . History of breast cancer    Past Surgical History  Procedure Laterality Date  . Abdominal hysterectomy  1976  . Breast reduction surgery    . Abdominal hysterectomy    . Tummy tuck    . Bunionectomy    . Artery biopsy Right 06/30/2013    Procedure: BIOPSY TEMPORAL ARTERY;  Surgeon: Larina Earthly, MD;  Location: [facility] OR;  Service: Vascular;  Laterality: Right;   Social History  Substance Use Topics  . Smoking status: Never Smoker   . Smokeless tobacco: Not on file  . Alcohol Use: No   Family History  Problem Relation Age of Onset  . Alcoholism      parents  . Prostate cancer      grandfather  . Hyperlipidemia      grandfather  . Breast cancer Sister   . Heart attack Father   . Diabetes Father     grandmother  . Cancer Father   . Heart disease Father   . Hyperlipidemia Father   . Hypertension Father   . Cancer Mother     Current  Outpatient Prescriptions  Medication Sig Dispense Refill  . ALPRAZolam (XANAX) 0.5 MG tablet Take 1 tablet (0.5 mg total) by mouth 2 (two) times daily as needed for anxiety. Needs f/u appt before future refills 20 tablet 0  . amLODipine (NORVASC) 10 MG tablet One tablet by mouth every day for blood pressure control 30 tablet 5  . atorvastatin (LIPITOR) 10 MG tablet Take 1 tablet (10 mg total) by mouth daily. 90 tablet 3  . Cholecalciferol (VITAMIN D-3) 5000 UNITS TABS Take 5,000 Units by mouth daily.    Marland Kitchen estrogen-methylTESTOSTERone (ESTRATEST) 1.25-2.5 MG per tablet TAKE 1 TABLET EVERY DAY 90 tablet 0  . hydrochlorothiazide (HYDRODIURIL) 25 MG tablet One tablet by mouth every morning for blood pressure control. 30 tablet 5  . lisinopril (PRINIVIL,ZESTRIL) 40 MG tablet TAKE 1 TABLET (40 MG TOTAL) BY MOUTH 2 (TWO) TIMES DAILY 60 tablet 5  . metoprolol succinate (TOPROL-XL) 100 MG 24 hr tablet TAKE 2 TABLET (200 MG TOTAL) BY MOUTH DAILY. TAKE WITH OR IMMEDIATELY FOLLOWING A MEAL. 60 tablet 5  . Multiple Vitamins-Minerals (MULTIVITAMIN PO) Take 1 tablet by mouth daily.    Marland Kitchen PARoxetine (PAXIL) 40 MG tablet Take 1 tablet (40 mg total) by mouth every morning. 90 tablet 1  . SitaGLIPtin-MetFORMIN HCl (JANUMET  XR) 50-1000 MG TB24 Take 2 tablets by mouth daily. For blood sugar control. 60 tablet 5   No current facility-administered medications for this visit.   Allergies  Allergen Reactions  . Morphine And Related Nausea And Vomiting      Review of Systems: CONSTITUTIONAL:  No  fever, no chills, No  unintentional weight changes HEAD/EYES/EARS/NOSE/THROAT: No headache, no vision change, no hearing change, No  sore throat, red eye as per HPI CARDIAC: No chest pain, no pressure/palpitations, no orthopnea RESPIRATORY: No  cough, No  shortness of breath/wheeze MUSCULOSKELETAL: No  myalgia/arthralgia SKIN: No rash/wounds/concerning lesions HEM/ONC: No easy bruising/bleeding, no abnormal lymph  node NEUROLOGIC: No weakness, no dizziness, no slurred speech   Exam:  BP 135/85 mmHg  Pulse 63  Temp(Src) 98.2 F (36.8 C) (Oral)  Ht 5\' 9"  (1.753 m)  Wt 212 lb (96.163 kg)  BMI 31.29 kg/m2 Constitutional: VSS, see above. General Appearance: alert, well-developed, well-nourished, NAD Eyes: Normal lids/iris/pupil, non-icteric sclera, PERRLA. Red sclera and engorged superficial blood vessels on medial R eye, no hypopyon or hyphema Ears, Nose, Mouth, Throat: Normal external inspection ears/nares/mouth/lips/gums, TM normal bilaterally, MMM, posterior pharynx No  erythema No  exudate Respiratory: Normal respiratory effort. no wheeze, no rhonchi, no rales Cardiovascular: S1/S2 normal, no murmur, no rub/gallop auscultated. RRR.    No results found for this or any previous visit (from the past 72 hour(s)).    ASSESSMENT/PLAN: Treat as below, w/u for systemic inflammatory causes given recurrence of this issue in different eyes as opposed to anatomic abnormality of vessles in one eye causing problems/recurrence, consider ophtho referral based on results or if recurrence, ER precautions reviewed re: headache or vision changes, BP manually checked by myself and WNL, antiinflammatory short course should be ok, risk vs benefit advised to pt and ER precautions reviewed re: CP/SOB other concerns.   Scleritis, left - Plan: predniSONE (STERAPRED UNI-PAK 48 TAB) 10 MG (48) TBPK tablet, ibuprofen (ADVIL,MOTRIN) 600 MG tablet, ANA, CBC with Differential/Platelet, COMPLETE METABOLIC PANEL WITH GFR, High sensitivity CRP, Rheumatoid factor, Sedimentation rate, Urinalysis, Urine Microscopic     Return if symptoms worsen or fail to improve.

## 2015-10-07 LAB — URINALYSIS
BILIRUBIN URINE: NEGATIVE
GLUCOSE, UA: NEGATIVE
HGB URINE DIPSTICK: NEGATIVE
Leukocytes, UA: NEGATIVE
Nitrite: NEGATIVE
Specific Gravity, Urine: 1.022 (ref 1.001–1.035)
pH: 5 (ref 5.0–8.0)

## 2015-10-07 LAB — SEDIMENTATION RATE: SED RATE: 27 mm/h (ref 0–30)

## 2015-10-07 LAB — URINALYSIS, MICROSCOPIC ONLY
BACTERIA UA: NONE SEEN [HPF]
Casts: NONE SEEN [LPF]
RBC / HPF: NONE SEEN RBC/HPF (ref <=2)
WBC UA: NONE SEEN WBC/HPF (ref <=5)
YEAST: NONE SEEN [HPF]

## 2015-10-07 LAB — HIGH SENSITIVITY CRP: CRP HIGH SENSITIVITY: 9.4 mg/L — AB

## 2015-10-08 LAB — ANA: ANA: NEGATIVE

## 2015-10-11 NOTE — Addendum Note (Signed)
Addended by: Deirdre PippinsALEXANDER, Philena Obey M on: 10/11/2015 04:15 PM   Modules accepted: Orders

## 2015-11-03 ENCOUNTER — Other Ambulatory Visit: Payer: Self-pay | Admitting: Family Medicine

## 2015-11-25 ENCOUNTER — Other Ambulatory Visit: Payer: Self-pay | Admitting: Family Medicine

## 2015-12-14 ENCOUNTER — Other Ambulatory Visit: Payer: Self-pay | Admitting: Family Medicine

## 2015-12-24 ENCOUNTER — Other Ambulatory Visit: Payer: Self-pay | Admitting: Family Medicine

## 2016-01-27 ENCOUNTER — Other Ambulatory Visit: Payer: Self-pay | Admitting: Family Medicine

## 2016-02-07 ENCOUNTER — Encounter: Payer: Self-pay | Admitting: Family Medicine

## 2016-02-07 ENCOUNTER — Ambulatory Visit (INDEPENDENT_AMBULATORY_CARE_PROVIDER_SITE_OTHER): Payer: BLUE CROSS/BLUE SHIELD | Admitting: Family Medicine

## 2016-02-07 VITALS — BP 136/87 | HR 92 | Wt 207.0 lb

## 2016-02-07 DIAGNOSIS — J329 Chronic sinusitis, unspecified: Secondary | ICD-10-CM

## 2016-02-07 DIAGNOSIS — I10 Essential (primary) hypertension: Secondary | ICD-10-CM | POA: Diagnosis not present

## 2016-02-07 DIAGNOSIS — E119 Type 2 diabetes mellitus without complications: Secondary | ICD-10-CM

## 2016-02-07 DIAGNOSIS — F32A Depression, unspecified: Secondary | ICD-10-CM

## 2016-02-07 DIAGNOSIS — A499 Bacterial infection, unspecified: Secondary | ICD-10-CM

## 2016-02-07 DIAGNOSIS — F329 Major depressive disorder, single episode, unspecified: Secondary | ICD-10-CM | POA: Diagnosis not present

## 2016-02-07 DIAGNOSIS — B9689 Other specified bacterial agents as the cause of diseases classified elsewhere: Secondary | ICD-10-CM

## 2016-02-07 LAB — POCT GLYCOSYLATED HEMOGLOBIN (HGB A1C): Hemoglobin A1C: 9.2

## 2016-02-07 MED ORDER — PAROXETINE HCL 40 MG PO TABS: 40.0000 mg | ORAL_TABLET | ORAL | Status: DC

## 2016-02-07 MED ORDER — ALPRAZOLAM 0.5 MG PO TABS: 0.5000 mg | ORAL_TABLET | Freq: Two times a day (BID) | ORAL | Status: DC | PRN

## 2016-02-07 MED ORDER — METFORMIN HCL 1000 MG PO TABS: ORAL_TABLET | ORAL | Status: DC

## 2016-02-07 MED ORDER — EST ESTROGENS-METHYLTEST 1.25-2.5 MG PO TABS: 1.0000 | ORAL_TABLET | Freq: Every day | ORAL | Status: DC

## 2016-02-07 MED ORDER — SITAGLIPTIN PHOSPHATE 100 MG PO TABS: 100.0000 mg | ORAL_TABLET | Freq: Every day | ORAL | Status: DC

## 2016-02-07 MED ORDER — CEFDINIR 300 MG PO CAPS: 300.0000 mg | ORAL_CAPSULE | Freq: Two times a day (BID) | ORAL | Status: AC

## 2016-02-07 NOTE — Progress Notes (Signed)
CC: Kayla MeuseDeborah A Gallagher is a 65 y.o. female is here for Hyperglycemia and Sinusitis   Subjective: HPI:  Follow essential hypertension: She had a new job since I saw her last and her stress level has drastically reduced. She was getting lightheaded so she ended up stopping metoprolol and lisinopril. She keeps taking amlodipine with no known side effects and she denies any motor or sensory disturbances. No outside blood pressures report. No chest pain shortness of breath nor edema  Follow-up type 2 diabetes: She was unable to afford Janumet XR so she went back to taking a single dose of metformin in the morning and then Januvia at bedtime. No outside blood sugars to report. Denies polyuria polyphagia or vision loss.  Follow-up depression: She tells me that Paxil has continued to reduce her depression to words no longer interfering with quality of life. She still gets anxious every once a while however this gets better after taking half of Xanax. A prescription of 20 of these has lasted her over 3 months now.  She brings a facial pressure in the forehead with nasal congestion and postnasal drip. Symptoms have now been present for longer than 1 week on a daily basis. No benefit from over-the-counter medication. Denies fever   Review Of Systems Outlined In HPI  Past Medical History  Diagnosis Date  . Hyperlipidemia   . History of hepatitis C 01/07/2013  . Diabetes mellitus without complication (HCC)   . Hypertension     Does not see cardiology  . Essential hypertension, benign 01/07/2013  . Depression   . Headache(784.0)   . History of anemia   . History of breast cancer     Past Surgical History  Procedure Laterality Date  . Abdominal hysterectomy  1976  . Breast reduction surgery    . Abdominal hysterectomy    . Tummy tuck    . Bunionectomy    . Artery biopsy Right 06/30/2013    Procedure: BIOPSY TEMPORAL ARTERY;  Surgeon: Larina Earthlyodd F Early, MD;  [facility];  Service: Vascular;   Laterality: Right;   Family History  Problem Relation Age of Onset  . Alcoholism      parents  . Prostate cancer      grandfather  . Hyperlipidemia      grandfather  . Breast cancer Sister   . Heart attack Father   . Diabetes Father     grandmother  . Cancer Father   . Heart disease Father   . Hyperlipidemia Father   . Hypertension Father   . Cancer Mother     Social History   Social History  . Marital Status: Married    Spouse Name: N/A  . Number of Children: N/A  . Years of Education: N/A   Occupational History  . Not on file.   Social History Main Topics  . Smoking status: Never Smoker   . Smokeless tobacco: Not on file  . Alcohol Use: No  . Drug Use: No  . Sexual Activity: Not Currently   Other Topics Concern  . Not on file   Social History Narrative     Objective: BP 136/87 mmHg  Pulse 92  Wt 207 lb (93.895 kg)  General: Alert and Oriented, No Acute Distress HEENT: Pupils equal, round, reactive to light. Conjunctivae clear.  External ears unremarkable, canals clear with intact TMs with appropriate landmarks.  Middle ear appears open without effusion. Pink inferior turbinates.  Moist mucous membranes, pharynx without inflammation nor lesions.  Neck  supple without palpable lymphadenopathy nor abnormal masses. Lungs: Clear to auscultation bilaterally, no wheezing/ronchi/rales.  Comfortable work of breathing. Good air movement. Cardiac: Regular rate and rhythm. Normal S1/S2.  No murmurs, rubs, nor gallops.   Extremities: No peripheral edema.  Strong peripheral pulses.  Mental Status: No depression, anxiety, nor agitation. Skin: Warm and dry.  Assessment & Plan: [name] was seen today for hyperglycemia and sinusitis.  Diagnoses and all orders for this visit:  Essential hypertension, benign  Type 2 diabetes mellitus without complication, without long-term current use of insulin (HCC) -     metFORMIN (GLUCOPHAGE) 1000 MG tablet; One by mouth twice a day  for blood sugar control.  Depression -     ALPRAZolam (XANAX) 0.5 MG tablet; Take 1 tablet (0.5 mg total) by mouth 2 (two) times daily as needed for anxiety. -     PARoxetine (PAXIL) 40 MG tablet; Take 1 tablet (40 mg total) by mouth every morning.  Bacterial sinusitis  Other orders -     estrogen-methylTESTOSTERone (ESTRATEST) 1.25-2.5 MG tablet; Take 1 tablet by mouth daily. -     cefdinir (OMNICEF) 300 MG capsule; Take 1 capsule (300 mg total) by mouth 2 (two) times daily. -     sitaGLIPtin (JANUVIA) 100 MG tablet; Take 1 tablet (100 mg total) by mouth daily.   Essential hypertension: Controlled continue amlodipine Type 2 diabetes with an A1c above 9 today, uncontrolled, begin taking metformin twice a day and continue with Januvia  Bacterial sinusitis: Start Omnicef and nasal saline washes Depression: Controlled with Paxil with only mild residual anxiety controlled with alprazolam   Return in about 3 months (around 05/09/2016) for sugar.

## 2016-02-07 NOTE — Addendum Note (Signed)
Addended by: Thom ChimesHENRY, Gabriela Giannelli M on: 02/07/2016 03:35 PM   Modules accepted: Orders

## 2016-05-01 ENCOUNTER — Ambulatory Visit (INDEPENDENT_AMBULATORY_CARE_PROVIDER_SITE_OTHER): Payer: BLUE CROSS/BLUE SHIELD | Admitting: Family Medicine

## 2016-05-01 ENCOUNTER — Encounter: Payer: Self-pay | Admitting: Family Medicine

## 2016-05-01 VITALS — BP 143/80 | HR 83 | Wt 203.0 lb

## 2016-05-01 DIAGNOSIS — I10 Essential (primary) hypertension: Secondary | ICD-10-CM | POA: Diagnosis not present

## 2016-05-01 DIAGNOSIS — L0390 Cellulitis, unspecified: Secondary | ICD-10-CM

## 2016-05-01 MED ORDER — ATORVASTATIN CALCIUM 10 MG PO TABS: 10.0000 mg | ORAL_TABLET | Freq: Every day | ORAL | Status: DC

## 2016-05-01 MED ORDER — SULFAMETHOXAZOLE-TRIMETHOPRIM 800-160 MG PO TABS: 1.0000 | ORAL_TABLET | Freq: Two times a day (BID) | ORAL | Status: DC

## 2016-05-01 NOTE — Progress Notes (Signed)
CC: Kayla Gallagher is a 65 y.o. female is here for skin lesions   Subjective: HPI:  Ulcerations that began on the back and now have spread to the chin, face and the back of the arms. They're slow to heal. They're slightly tender to touch. They will use a clear fluid but never pus. She connected them to go away completely. She had similar issues last year that responded to Bactrim. She denies fevers, chills, nausea, or flushing. No joint pain or sore throat. Symptoms are mild-to-moderate in severity.  She wants to know she needs to do anything about her blood pressure. No outside blood pressures reported. No chest pain shortness of breath orthopnea nor peripheral edema    Review Of Systems Outlined In HPI  Past Medical History  Diagnosis Date  . Hyperlipidemia   . History of hepatitis C 01/07/2013  . Diabetes mellitus without complication (HCC)   . Hypertension     Does not see cardiology  . Essential hypertension, benign 01/07/2013  . Depression   . Headache(784.0)   . History of anemia   . History of breast cancer     Past Surgical History  Procedure Laterality Date  . Abdominal hysterectomy  1976  . Breast reduction surgery    . Abdominal hysterectomy    . Tummy tuck    . Bunionectomy    . Artery biopsy Right 06/30/2013    Procedure: BIOPSY TEMPORAL ARTERY;  Surgeon: Larina Earthlyodd F Early, MD;  [facility];  Service: Vascular;  Laterality: Right;   Family History  Problem Relation Age of Onset  . Alcoholism      parents  . Prostate cancer      grandfather  . Hyperlipidemia      grandfather  . Breast cancer Sister   . Heart attack Father   . Diabetes Father     grandmother  . Cancer Father   . Heart disease Father   . Hyperlipidemia Father   . Hypertension Father   . Cancer Mother     Social History   Social History  . Marital Status: Married    Spouse Name: N/A  . Number of Children: N/A  . Years of Education: N/A   Occupational History  . Not on file.    Social History Main Topics  . Smoking status: Never Smoker   . Smokeless tobacco: Not on file  . Alcohol Use: No  . Drug Use: No  . Sexual Activity: Not Currently   Other Topics Concern  . Not on file   Social History Narrative     Objective: BP 143/80 mmHg  Pulse 83  Wt 203 lb (92.08 kg)  Vital signs reviewed. General: Alert and Oriented, No Acute Distress HEENT: Pupils equal, round, reactive to light. Conjunctivae clear.  External ears unremarkable.  Moist mucous membranes. Lungs: Clear and comfortable work of breathing, speaking in full sentences without accessory muscle use. Cardiac: Regular rate and rhythm.  Neuro: CN II-XII grossly intact, gait normal. Extremities: No peripheral edema.  Strong peripheral pulses.  Mental Status: No depression, anxiety, nor agitation. Logical though process. Skin: Warm and dry. Shallow ulcerations ranging in size from 2 mm in diameter-5 mm in diameter with surrounding erythema but no fluctuance involving the forehead, chin, cheeks, back of the arms.  Assessment & Plan: Gavin PoundDeborah was seen today for skin lesions.  Diagnoses and all orders for this visit:  Cellulitis, unspecified cellulitis site, unspecified extremity site, unspecified laterality -     atorvastatin (  LIPITOR) 10 MG tablet; Take 1 tablet (10 mg total) by mouth daily. -     sulfamethoxazole-trimethoprim (BACTRIM DS,SEPTRA DS) 800-160 MG tablet; Take 1 tablet by mouth 2 (two) times daily.  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 Start Bactrim daily. Call if no better appearance by Wednesday. Signs and symptoms requring emergent/urgent reevaluation were discussed with the patient. Essential hypertension: Uncontrolled chronic condition restart former dose of lisinopril   Return if symptoms worsen or fail to improve.

## 2016-05-20 ENCOUNTER — Other Ambulatory Visit: Payer: Self-pay | Admitting: Family Medicine

## 2016-05-30 ENCOUNTER — Other Ambulatory Visit: Payer: Self-pay | Admitting: Family Medicine

## 2016-06-12 ENCOUNTER — Ambulatory Visit (INDEPENDENT_AMBULATORY_CARE_PROVIDER_SITE_OTHER): Payer: BLUE CROSS/BLUE SHIELD | Admitting: Family Medicine

## 2016-06-12 ENCOUNTER — Encounter: Payer: Self-pay | Admitting: Family Medicine

## 2016-06-12 VITALS — BP 152/87 | HR 90 | Wt 202.0 lb

## 2016-06-12 DIAGNOSIS — I10 Essential (primary) hypertension: Secondary | ICD-10-CM

## 2016-06-12 DIAGNOSIS — H1132 Conjunctival hemorrhage, left eye: Secondary | ICD-10-CM

## 2016-06-12 DIAGNOSIS — E119 Type 2 diabetes mellitus without complications: Secondary | ICD-10-CM | POA: Diagnosis not present

## 2016-06-12 LAB — POCT GLYCOSYLATED HEMOGLOBIN (HGB A1C): HEMOGLOBIN A1C: 9.2

## 2016-06-12 MED ORDER — PIOGLITAZONE HCL 45 MG PO TABS: 45.0000 mg | ORAL_TABLET | Freq: Every day | ORAL | 3 refills | Status: DC

## 2016-06-12 MED ORDER — LISINOPRIL 40 MG PO TABS: 40.0000 mg | ORAL_TABLET | Freq: Two times a day (BID) | ORAL | 1 refills | Status: DC

## 2016-06-12 NOTE — Progress Notes (Signed)
CC: Kayla Gallagher is a 65 y.o. female is here for Hyperglycemia   Subjective: HPI:  Follow-up essential hypertension: Currently taking lisinopril and hydrochlorothiazide however 15 days ago her prescription for lisinopril ran out. When taking this medication on a daily basis her blood pressures were in the normotensive range. She denies chest pain shortness of breath orthopnea or peripheral edema.  Follow-up type 2 diabetes: She was unable to afford Januvia and has only been taking metformin. No outside blood sugars to report. Denies vision disturbance, polyuria polyphagia or polydipsia.   Review Of Systems Outlined In HPI  Past Medical History:  Diagnosis Date  . Depression   . Diabetes mellitus without complication (HCC)   . Essential hypertension, benign 01/07/2013  . Headache(784.0)   . History of anemia   . History of breast cancer   . History of hepatitis C 01/07/2013  . Hyperlipidemia   . Hypertension    Does not see cardiology    Past Surgical History:  Procedure Laterality Date  . ABDOMINAL HYSTERECTOMY  1976  . ABDOMINAL HYSTERECTOMY    . ARTERY BIOPSY Right 06/30/2013   Procedure: BIOPSY TEMPORAL ARTERY;  Surgeon: Larina Earthly, MD;  Location: [facility] OR;  Service: Vascular;  Laterality: Right;  . BREAST REDUCTION SURGERY    . BUNIONECTOMY    . tummy tuck     Family History  Problem Relation Age of Onset  . Alcoholism      parents  . Prostate cancer      grandfather  . Hyperlipidemia      grandfather  . Breast cancer Sister   . Heart attack Father   . Diabetes Father     grandmother  . Cancer Father   . Heart disease Father   . Hyperlipidemia Father   . Hypertension Father   . Cancer Mother     Social History   Social History  . Marital status: Married    Spouse name: N/A  . Number of children: N/A  . Years of education: N/A   Occupational History  . Not on file.   Social History Main Topics  . Smoking status: Never Smoker  . Smokeless tobacco:  Not on file  . Alcohol use No  . Drug use: No  . Sexual activity: Not Currently   Other Topics Concern  . Not on file   Social History Narrative  . No narrative on file     Objective: BP (!) 152/87   Pulse 90   Wt 202 lb (91.6 kg)   BMI 29.83 kg/m   General: Alert and Oriented, No Acute Distress HEENT: Pupils equal, round, reactive to light. Right Conjunctivae clear, left medial conjunctiva with a hemorrhage.  External ears unremarkable, canals clear with intact TMs with appropriate landmarks.  Middle ear appears open without effusion. Pink inferior turbinates.  Moist mucous membranes, pharynx without inflammation nor lesions.  Neck supple without palpable lymphadenopathy nor abnormal masses. Lungs: Clear to auscultation bilaterally, no wheezing/ronchi/rales.  Comfortable work of breathing. Good air movement. Cardiac: Regular rate and rhythm. Normal S1/S2.  No murmurs, rubs, nor gallops.   Extremities: No peripheral edema.  Strong peripheral pulses.  Mental Status: No depression, anxiety, nor agitation. Skin: Warm and dry.  Assessment & Plan: Kayla Gallagher was seen today for hyperglycemia.  Diagnoses and all orders for this visit:  Essential hypertension, benign -     lisinopril (PRINIVIL,ZESTRIL) 40 MG tablet; Take 1 tablet (40 mg total) by mouth 2 (two) times daily.  Type  2 diabetes mellitus without complication, without long-term current use of insulin (HCC) -     pioglitazone (ACTOS) 45 MG tablet; Take 1 tablet (45 mg total) by mouth daily.  Conjunctival hemorrhage of left eye  Essential hypertension: Uncontrolled off of lisinopril restart former regimen of 40 mg twice a day and I made sure that she has a 90 day supply with refill. Continue HCTZ Type 2 diabetes: A1c of 9.2, uncontrolled continue metformin starting Actos Discussed benign nature of her conjunctival hemorrhage and that should clear within a week and a half of onset. Signs and symptoms requring emergent/urgent  reevaluation were discussed with the patient.   Return in about 3 months (around 09/12/2016) for DM. Discussed with this patient that I will be resigning from my position here with [facility] [employer] [facility] [employer] in September in order to stay with my family who will be moving to [facility]. I let him know about the providers that are still accepting patients and I feel that this individual will be under great care if he/she stays here with [facility].

## 2016-06-12 NOTE — Addendum Note (Signed)
Addended by: Thom Chimes on: 06/12/2016 11:29 AM   Modules accepted: Orders

## 2016-06-30 ENCOUNTER — Other Ambulatory Visit: Payer: Self-pay | Admitting: Family Medicine

## 2016-06-30 DIAGNOSIS — L0390 Cellulitis, unspecified: Secondary | ICD-10-CM

## 2016-07-12 ENCOUNTER — Encounter: Payer: Self-pay | Admitting: Family Medicine

## 2016-07-12 ENCOUNTER — Ambulatory Visit (INDEPENDENT_AMBULATORY_CARE_PROVIDER_SITE_OTHER): Payer: BLUE CROSS/BLUE SHIELD | Admitting: Family Medicine

## 2016-07-12 VITALS — BP 130/82 | HR 79 | Wt 200.0 lb

## 2016-07-12 DIAGNOSIS — L03811 Cellulitis of head [any part, except face]: Secondary | ICD-10-CM

## 2016-07-12 MED ORDER — DOXYCYCLINE HYCLATE 100 MG PO TABS: ORAL_TABLET | ORAL | 0 refills | Status: AC

## 2016-07-12 MED ORDER — CHLORHEXIDINE GLUCONATE 4 % EX SOLN: CUTANEOUS | 11 refills | Status: DC

## 2016-07-12 NOTE — Progress Notes (Signed)
CC: Kayla Gallagher is a 65 y.o. female is here for No chief complaint on file.   Subjective: HPI:  Rash on the face and the scalp that began 2-3 days ago. It's tender to the touch. It has the appearance of her prior MRSA infections on the face and back. She's been treating this with triple antibody appointment that's not helping. She denies fevers, chills or skin changes elsewhere. She denies any recent change in personal care products. She denies any hair loss. Symptoms are mild in severity but worsening   Review Of Systems Outlined In HPI  Past Medical History:  Diagnosis Date  . Depression   . Diabetes mellitus without complication (HCC)   . Essential hypertension, benign 01/07/2013  . Headache(784.0)   . History of anemia   . History of breast cancer   . History of hepatitis C 01/07/2013  . Hyperlipidemia   . Hypertension    Does not see cardiology    Past Surgical History:  Procedure Laterality Date  . ABDOMINAL HYSTERECTOMY  1976  . ABDOMINAL HYSTERECTOMY    . ARTERY BIOPSY Right 06/30/2013   Procedure: BIOPSY TEMPORAL ARTERY;  Surgeon: Larina Earthly, MD;  Location[facility] OR;  Service: Vascular;  Laterality: Right;  . BREAST REDUCTION SURGERY    . BUNIONECTOMY    . tummy tuck     Family History  Problem Relation Age of Onset  . Alcoholism      parents  . Prostate cancer      grandfather  . Hyperlipidemia      grandfather  . Breast cancer Sister   . Heart attack Father   . Diabetes Father     grandmother  . Cancer Father   . Heart disease Father   . Hyperlipidemia Father   . Hypertension Father   . Cancer Mother     Social History   Social History  . Marital status: Married    Spouse name: N/A  . Number of children: N/A  . Years of education: N/A   Occupational History  . Not on file.   Social History Main Topics  . Smoking status: Never Smoker  . Smokeless tobacco: Not on file  . Alcohol use No  . Drug use: No  . Sexual activity: Not Currently    Other Topics Concern  . Not on file   Social History Narrative  . No narrative on file     Objective: BP 130/82   Pulse 79   Wt 200 lb (90.7 kg)   BMI 29.53 kg/m   Vital signs reviewed. General: Alert and Oriented, No Acute Distress HEENT: Pupils equal, round, reactive to light. Conjunctivae clear.  External ears unremarkable.  Moist mucous membranes. Lungs: Clear and comfortable work of breathing, speaking in full sentences without accessory muscle use. Cardiac: Regular rate and rhythm.  Neuro: CN II-XII grossly intact, gait normal. Extremities: No peripheral edema.  Strong peripheral pulses.  Mental Status: No depression, anxiety, nor agitation. Logical though process. Skin: Warm and dry. On the back of the neck and somewhat in the back of the scalp there is small erythematous ulcerations ranging from 2 mm in diameter up to 5 mm in diameter. No signs of abscess Assessment & Plan: Diagnoses and all orders for this visit:  Cellulitis of head except face  Other orders -     Chlorhexidine Gluconate 4 % SOLN; Use as body wash every two to three days to reduce MRSA outbreaks. -  doxycycline (VIBRA-TABS) 100 MG tablet; One by mouth twice a day for ten days.   Start doxycycline for active infection and begin using chlorhexidine every 2-3 days as a facial wash in hopes of reducing MRSA colonization.   Return in about 3 months (around 10/12/2016) for Visit with Jade.

## 2016-08-10 ENCOUNTER — Other Ambulatory Visit: Payer: Self-pay | Admitting: Family Medicine

## 2016-08-10 DIAGNOSIS — F32A Depression, unspecified: Secondary | ICD-10-CM

## 2016-08-10 DIAGNOSIS — F329 Major depressive disorder, single episode, unspecified: Secondary | ICD-10-CM

## 2016-08-15 ENCOUNTER — Other Ambulatory Visit: Payer: Self-pay | Admitting: Family Medicine

## 2016-08-15 DIAGNOSIS — E119 Type 2 diabetes mellitus without complications: Secondary | ICD-10-CM

## 2016-09-08 ENCOUNTER — Other Ambulatory Visit: Payer: Self-pay | Admitting: Physician Assistant

## 2016-09-08 DIAGNOSIS — F329 Major depressive disorder, single episode, unspecified: Secondary | ICD-10-CM

## 2016-09-08 DIAGNOSIS — F32A Depression, unspecified: Secondary | ICD-10-CM

## 2016-10-02 ENCOUNTER — Other Ambulatory Visit: Payer: Self-pay

## 2016-10-02 DIAGNOSIS — F3289 Other specified depressive episodes: Secondary | ICD-10-CM

## 2016-10-02 MED ORDER — PAROXETINE HCL 40 MG PO TABS: 40.0000 mg | ORAL_TABLET | ORAL | 0 refills | Status: DC

## 2016-10-02 MED ORDER — EST ESTROGENS-METHYLTEST 1.25-2.5 MG PO TABS: 1.0000 | ORAL_TABLET | Freq: Every day | ORAL | 0 refills | Status: DC

## 2016-10-19 ENCOUNTER — Other Ambulatory Visit: Payer: Self-pay | Admitting: Family Medicine

## 2016-10-19 DIAGNOSIS — E119 Type 2 diabetes mellitus without complications: Secondary | ICD-10-CM

## 2016-10-20 ENCOUNTER — Other Ambulatory Visit: Payer: Self-pay | Admitting: *Deleted

## 2016-10-20 DIAGNOSIS — E119 Type 2 diabetes mellitus without complications: Secondary | ICD-10-CM

## 2016-10-20 MED ORDER — PIOGLITAZONE HCL 45 MG PO TABS
45.0000 mg | ORAL_TABLET | Freq: Every day | ORAL | 0 refills | Status: DC
Start: 2016-10-20 — End: 2016-11-20

## 2016-10-25 ENCOUNTER — Telehealth: Payer: Self-pay | Admitting: *Deleted

## 2016-10-25 NOTE — Telephone Encounter (Signed)
Accidentally click on "choose alternate medication" instead of complete PA so a form will be faxed to the office    Formed faxed to insurance

## 2016-10-27 ENCOUNTER — Telehealth: Payer: Self-pay | Admitting: Physician Assistant

## 2016-10-27 NOTE — Telephone Encounter (Signed)
Received fax from [employer] and they approved Pioglitazone HCL form 10/26/2016 - 10/26/2017. Reference Number [id number]. I will call pharmacy to let them know. - CF

## 2016-10-31 ENCOUNTER — Ambulatory Visit: Payer: Medicare Other | Admitting: Physician Assistant

## 2016-11-01 ENCOUNTER — Encounter: Payer: Self-pay | Admitting: Family Medicine

## 2016-11-01 ENCOUNTER — Ambulatory Visit (INDEPENDENT_AMBULATORY_CARE_PROVIDER_SITE_OTHER): Payer: BLUE CROSS/BLUE SHIELD | Admitting: Family Medicine

## 2016-11-01 VITALS — BP 158/86 | HR 78 | Temp 98.2°F | Wt 208.0 lb

## 2016-11-01 DIAGNOSIS — J069 Acute upper respiratory infection, unspecified: Secondary | ICD-10-CM

## 2016-11-01 DIAGNOSIS — B9789 Other viral agents as the cause of diseases classified elsewhere: Secondary | ICD-10-CM

## 2016-11-01 MED ORDER — IPRATROPIUM BROMIDE 0.06 % NA SOLN
2.0000 | NASAL | 6 refills | Status: DC | PRN
Start: 2016-11-01 — End: 2016-11-20

## 2016-11-01 MED ORDER — PREDNISONE 10 MG PO TABS: 30.0000 mg | ORAL_TABLET | Freq: Every day | ORAL | 0 refills | Status: DC

## 2016-11-01 MED ORDER — AZITHROMYCIN 250 MG PO TABS: 250.0000 mg | ORAL_TABLET | Freq: Every day | ORAL | 0 refills | Status: DC

## 2016-11-01 NOTE — Patient Instructions (Signed)
Thank you for coming in today. Call or go to the emergency room if you get worse, have trouble breathing, have chest pains, or palpitations.  Use the nasal spray as well as over the counter medicines.  If you worsen you can take the prednisone and azithromycin antibiotics.   .Return as needed.    Upper Respiratory Infection, Adult Most upper respiratory infections (URIs) are caused by a virus. A URI affects the nose, throat, and upper air passages. The most common type of URI is often called "the common cold." Follow these instructions at home:  Take medicines only as told by your doctor.  Gargle warm saltwater or take cough drops to comfort your throat as told by your doctor.  Use a warm mist humidifier or inhale steam from a shower to increase air moisture. This may make it easier to breathe.  Drink enough fluid to keep your pee (urine) clear or pale yellow.  Eat soups and other clear broths.  Have a healthy diet.  Rest as needed.  Go back to work when your fever is gone or your doctor says it is okay.  You may need to stay home longer to avoid giving your URI to others.  You can also wear a face mask and wash your hands often to prevent spread of the virus.  Use your inhaler more if you have asthma.  Do not use any tobacco products, including cigarettes, chewing tobacco, or electronic cigarettes. If you need help quitting, ask your doctor. Contact a doctor if:  You are getting worse, not better.  Your symptoms are not helped by medicine.  You have chills.  You are getting more short of breath.  You have brown or red mucus.  You have yellow or brown discharge from your nose.  You have pain in your face, especially when you bend forward.  You have a fever.  You have puffy (swollen) neck glands.  You have pain while swallowing.  You have white areas in the back of your throat. Get help right away if:  You have very bad or constant:  Headache.  Ear  pain.  Pain in your forehead, behind your eyes, and over your cheekbones (sinus pain).  Chest pain.  You have long-lasting (chronic) lung disease and any of the following:  Wheezing.  Long-lasting cough.  Coughing up blood.  A change in your usual mucus.  You have a stiff neck.  You have changes in your:  Vision.  Hearing.  Thinking.  Mood. This information is not intended to replace advice given to you by your health care provider. Make sure you discuss any questions you have with your health care provider. Document Released: 04/17/2008 Document Revised: 07/02/2016 Document Reviewed: 02/04/2014 Elsevier Interactive Patient Education  2017 [employer]

## 2016-11-01 NOTE — Progress Notes (Signed)
Kayla Gallagher is a 65 y.o. female who [name] today for cough congestion and runny nose. Symptoms present for 2 days. Patient has tried over-the-counter medicines which helped a little. She notes mild sore throat as well.   Past Medical History:  Diagnosis Date  . Depression   . Diabetes mellitus without complication (HCC)   . Essential hypertension, benign 01/07/2013  . Headache(784.0)   . History of anemia   . History of breast cancer   . History of hepatitis C 01/07/2013  . Hyperlipidemia   . Hypertension    Does not see cardiology   Past Surgical History:  Procedure Laterality Date  . ABDOMINAL HYSTERECTOMY  1976  . ABDOMINAL HYSTERECTOMY    . ARTERY BIOPSY Right 06/30/2013   Procedure: BIOPSY TEMPORAL ARTERY;  Surgeon: Larina Earthlyodd F Early, MD;  [facility];  Service: Vascular;  Laterality: Right;  . BREAST REDUCTION SURGERY    . BUNIONECTOMY    . tummy tuck     Social History  Substance Use Topics  . Smoking status: Never Smoker  . Smokeless tobacco: Not on file  . Alcohol use No   family history includes Breast cancer in her sister; Cancer in her father and mother; Diabetes in her father; Heart attack in her father; Heart disease in her father; Hyperlipidemia in her father; Hypertension in her father.  ROS as above:  Medications: Current Outpatient Prescriptions  Medication Sig Dispense Refill  . ALPRAZolam (XANAX) 0.5 MG tablet Take 1 tablet (0.5 mg total) by mouth 2 (two) times daily as needed for anxiety. 20 tablet 1  . amLODipine (NORVASC) 10 MG tablet ONE TABLET BY MOUTH EVERY DAY FOR BLOOD PRESSURE CONTROL 30 tablet 4  . atorvastatin (LIPITOR) 10 MG tablet Take 1 tablet (10 mg total) by mouth daily. 90 tablet 1  . Chlorhexidine Gluconate 4 % SOLN Use as body wash every two to three days to reduce MRSA outbreaks. 237 mL 11  .  Cholecalciferol (VITAMIN D-3) 5000 UNITS TABS Take 5,000 Units by mouth daily.    Marland Kitchen. estrogens-methylTEST (ESTRATEST) 1.25-2.5 MG tablet Take 1 tablet by mouth daily. 30 tablet 0  . hydrochlorothiazide (HYDRODIURIL) 25 MG tablet ONE TABLET BY MOUTH EVERY MORNING FOR BLOOD PRESSURE CONTROL. 30 tablet 5  . lisinopril (PRINIVIL,ZESTRIL) 40 MG tablet Take 1 tablet (40 mg total) by mouth 2 (two) times daily. 180 tablet 1  . metFORMIN (GLUCOPHAGE) 1000 MG tablet Take 1 tablet (1,000 mg total) by mouth 2 (two) times daily with a meal. NEED FOLLOW UP APPOINTMENT FOR MORE REFILLS 60 tablet 0  . Multiple Vitamins-Minerals (MULTIVITAMIN PO) Take 1 tablet by mouth daily.    Marland Kitchen. PARoxetine (PAXIL) 40 MG tablet Take 1 tablet (40 mg total) by mouth every morning. 30 tablet 0  . pioglitazone (ACTOS) 45 MG tablet Take 1 tablet (45 mg total) by mouth daily. 30 tablet 0  . azithromycin (ZITHROMAX) 250 MG tablet Take 1 tablet (250 mg total) by mouth daily. Take first 2 tablets together, then 1 every day until finished. 6 tablet 0  . ipratropium (ATROVENT) 0.06 % nasal spray Place 2 sprays into both nostrils every 4 (four) hours as needed for rhinitis. 10 mL 6  . predniSONE (DELTASONE) 10 MG tablet Take 3 tablets (30 mg total) by mouth daily. 15 tablet 0   No current facility-administered medications for this visit.    Allergies  Allergen Reactions  .  Morphine And Related Nausea And Vomiting    Health Maintenance Health Maintenance  Topic Date Due  . OPHTHALMOLOGY EXAM  10/20/1960  . TETANUS/TDAP  10/20/1969  . ZOSTAVAX  10/20/2010  . DEXA SCAN  10/21/2015  . PNA vac Low Risk Adult (1 of 2 - PCV13) 10/21/2015  . MAMMOGRAM  03/03/2016  . INFLUENZA VACCINE  06/13/2016  . FOOT EXAM  06/23/2016  . HEMOGLOBIN A1C  12/13/2016  . COLONOSCOPY  09/20/2021  . Hepatitis C Screening  Completed     Exam:  BP (!) 158/86   Pulse 78   Temp 98.2 F (36.8 C) (Oral)   Wt 208 lb (94.3 kg)   SpO2 97%   BMI 30.72 kg/m   Gen: Well NAD HEENT: EOMI,  MMM Clear nasal discharge. Posterior pharynx with cobblestoning. Normal tympanic membranes bilaterally. Lungs: Normal work of breathing. CTABL Heart: RRR no MRG Abd: NABS, Soft. Nondistended, Nontender Exts: Brisk capillary refill, warm and well perfused.    No results found for this or any previous visit (from the past 72 hour(s)). No results found.    Assessment and Plan: 65 y.o. female with viral URI. Symptomatic management with Atrovent nasal spray and over-the-counter medications. If not improved will use prednisone and azithromycin. Return as needed.   No orders of the defined types were placed in this encounter.   Discussed warning signs or symptoms. Please see discharge instructions. Patient expresses understanding.

## 2016-11-05 ENCOUNTER — Other Ambulatory Visit: Payer: Self-pay | Admitting: Family Medicine

## 2016-11-05 ENCOUNTER — Other Ambulatory Visit: Payer: Self-pay | Admitting: Physician Assistant

## 2016-11-05 DIAGNOSIS — E119 Type 2 diabetes mellitus without complications: Secondary | ICD-10-CM

## 2016-11-05 DIAGNOSIS — F3289 Other specified depressive episodes: Secondary | ICD-10-CM

## 2016-11-17 ENCOUNTER — Ambulatory Visit: Payer: Medicare Other | Admitting: Physician Assistant

## 2016-11-20 ENCOUNTER — Encounter: Payer: Self-pay | Admitting: Physician Assistant

## 2016-11-20 ENCOUNTER — Ambulatory Visit (INDEPENDENT_AMBULATORY_CARE_PROVIDER_SITE_OTHER): Payer: BLUE CROSS/BLUE SHIELD | Admitting: Physician Assistant

## 2016-11-20 VITALS — BP 150/85 | HR 79 | Ht 69.0 in | Wt 206.0 lb

## 2016-11-20 DIAGNOSIS — I10 Essential (primary) hypertension: Secondary | ICD-10-CM

## 2016-11-20 DIAGNOSIS — E119 Type 2 diabetes mellitus without complications: Secondary | ICD-10-CM | POA: Diagnosis not present

## 2016-11-20 DIAGNOSIS — Z1502 Genetic susceptibility to malignant neoplasm of ovary: Secondary | ICD-10-CM

## 2016-11-20 DIAGNOSIS — Z1231 Encounter for screening mammogram for malignant neoplasm of breast: Secondary | ICD-10-CM

## 2016-11-20 DIAGNOSIS — Z1509 Genetic susceptibility to other malignant neoplasm: Secondary | ICD-10-CM

## 2016-11-20 DIAGNOSIS — Z1501 Genetic susceptibility to malignant neoplasm of breast: Secondary | ICD-10-CM | POA: Diagnosis not present

## 2016-11-20 DIAGNOSIS — Z1211 Encounter for screening for malignant neoplasm of colon: Secondary | ICD-10-CM

## 2016-11-20 DIAGNOSIS — F3289 Other specified depressive episodes: Secondary | ICD-10-CM | POA: Diagnosis not present

## 2016-11-20 DIAGNOSIS — E7800 Pure hypercholesterolemia, unspecified: Secondary | ICD-10-CM

## 2016-11-20 DIAGNOSIS — N951 Menopausal and female climacteric states: Secondary | ICD-10-CM

## 2016-11-20 LAB — LIPID PANEL
Cholesterol: 165 mg/dL (ref <200)
HDL: 41 mg/dL — ABNORMAL LOW (ref >50)
LDL CALC: 93 mg/dL (ref <100)
Total CHOL/HDL Ratio: 4 Ratio (ref <5.0)
Triglycerides: 154 mg/dL — ABNORMAL HIGH (ref <150)
VLDL: 31 mg/dL — AB (ref <30)

## 2016-11-20 LAB — COMPLETE METABOLIC PANEL WITH GFR
ALT: 18 U/L (ref 6–29)
AST: 21 U/L (ref 10–35)
Albumin: 4.4 g/dL (ref 3.6–5.1)
Alkaline Phosphatase: 66 U/L (ref 33–130)
BUN: 21 mg/dL (ref 7–25)
CHLORIDE: 102 mmol/L (ref 98–110)
CO2: 30 mmol/L (ref 20–31)
Calcium: 10 mg/dL (ref 8.6–10.4)
Creat: 0.92 mg/dL (ref 0.50–0.99)
GFR, EST NON AFRICAN AMERICAN: 65 mL/min (ref >=60)
GFR, Est African American: 75 mL/min (ref >=60)
Glucose, Bld: 140 mg/dL — ABNORMAL HIGH (ref 65–99)
Potassium: 4.8 mmol/L (ref 3.5–5.3)
SODIUM: 138 mmol/L (ref 135–146)
Total Bilirubin: 0.5 mg/dL (ref 0.2–1.2)
Total Protein: 7.9 g/dL (ref 6.1–8.1)

## 2016-11-20 LAB — HEMOGLOBIN A1C
Hgb A1c MFr Bld: 7.3 % — ABNORMAL HIGH (ref <5.7)
Mean Plasma Glucose: 163 mg/dL

## 2016-11-20 MED ORDER — LISINOPRIL 40 MG PO TABS: 40.0000 mg | ORAL_TABLET | Freq: Two times a day (BID) | ORAL | 1 refills | Status: DC

## 2016-11-20 MED ORDER — PIOGLITAZONE HCL 45 MG PO TABS: 45.0000 mg | ORAL_TABLET | Freq: Every day | ORAL | 0 refills | Status: DC

## 2016-11-20 MED ORDER — AMLODIPINE BESYLATE 10 MG PO TABS: ORAL_TABLET | ORAL | 1 refills | Status: DC

## 2016-11-20 MED ORDER — METFORMIN HCL 1000 MG PO TABS: 1000.0000 mg | ORAL_TABLET | Freq: Two times a day (BID) | ORAL | 0 refills | Status: DC

## 2016-11-20 MED ORDER — EST ESTROGENS-METHYLTEST 1.25-2.5 MG PO TABS: 1.0000 | ORAL_TABLET | Freq: Every day | ORAL | 1 refills | Status: DC

## 2016-11-20 MED ORDER — HYDROCHLOROTHIAZIDE 25 MG PO TABS: ORAL_TABLET | ORAL | 1 refills | Status: DC

## 2016-11-20 MED ORDER — ATORVASTATIN CALCIUM 10 MG PO TABS: 10.0000 mg | ORAL_TABLET | Freq: Every day | ORAL | 1 refills | Status: DC

## 2016-11-20 MED ORDER — PAROXETINE HCL 40 MG PO TABS: 40.0000 mg | ORAL_TABLET | Freq: Every morning | ORAL | 1 refills | Status: DC

## 2016-11-20 NOTE — Progress Notes (Signed)
Subjective:    Patient ID: Kayla Gallagher, female    DOB: 31-Oct-1951, 66 y.o.   MRN: [id number]  HPI  Pt is a 66 yo female who presents to the clinic to establish care after PCP leaving.   .. Active Ambulatory Problems    Diagnosis Date Noted  . History of hepatitis C 01/07/2013  . Essential hypertension, benign 01/07/2013  . Hyperlipidemia 01/07/2013  . History of breast cancer 01/07/2013  . Depression 01/07/2013  . 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  . Hyperplastic colon polyp 01/22/2013  . Type 2 diabetes mellitus ([place]) 02/21/2013  . Breast mass, right 02/21/2013  . Actinic keratosis 04/16/2013  . Temporal arteritis ([place]) 06/18/2013  . Strain of elbow, left 06/01/2014  . Anxiety state 05/04/2015  . Obesity 09/09/2015   Resolved Ambulatory Problems    Diagnosis Date Noted  . No Resolved Ambulatory Problems   Past Medical History:  Diagnosis Date  . Depression   . Diabetes mellitus without complication ([place])   . Essential hypertension, benign 01/07/2013  . Headache(784.0)   . History of anemia   . History of breast cancer   . History of hepatitis C 01/07/2013  . Hyperlipidemia   . Hypertension    .Marland Kitchen Family History  Problem Relation Age of Onset  . Alcoholism      parents  . Prostate cancer      grandfather  . Hyperlipidemia      grandfather  . Breast cancer Sister   . Heart attack Father   . Diabetes Father     grandmother  . Cancer Father   . Heart disease Father   . Hyperlipidemia Father   . Hypertension Father   . Cancer Mother    .Marland Kitchen Social History   Social History  . Marital status: Married    Spouse name: N/A  . Number of children: N/A  . Years of education: N/A   Occupational History  . Not on file.   Social History Main Topics  . Smoking status: Never Smoker  . Smokeless tobacco: Not on file  . Alcohol use No  . Drug use: No  . Sexual activity: Not Currently   Other Topics Concern  . Not on file   Social History Narrative  . No  narrative on file   DM- pt is taking actos and metformin daily. She is not checking sugars. She denies any hypoglycemic events. She has no open sores or wounds. She has not had eye exam.   HTN- she denies any CP, palpitations, headaches, or dizziness. She did not take BP medication today because she ran out.    Review of Systems    see HPI.  Objective:   Physical Exam  Constitutional: She is oriented to person, place, and time. She appears well-developed and well-nourished.  HENT:  Head: Normocephalic and atraumatic.  Cardiovascular: Normal rate, regular rhythm and normal heart sounds.   Pulmonary/Chest: Effort normal and breath sounds normal.  Neurological: She is alert and oriented to person, place, and time.  Psychiatric: She has a normal mood and affect. Her behavior is normal.          Assessment & Plan:  [name] KitchenDiagnoses and all orders for this visit:  Type 2 diabetes mellitus without complication, without long-term current use of insulin (HCC) -     metFORMIN (GLUCOPHAGE) 1000 MG tablet; Take 1 tablet (1,000 mg total) by mouth 2 (two) times daily with a meal. -     pioglitazone (ACTOS) 45 MG tablet;  Take 1 tablet (45 mg total) by mouth daily. -     COMPLETE METABOLIC PANEL WITH GFR -     Hemoglobin A1c  Essential hypertension, benign -     lisinopril (PRINIVIL,ZESTRIL) 40 MG tablet; Take 1 tablet (40 mg total) by mouth 2 (two) times daily. -     amLODipine (NORVASC) 10 MG tablet; ONE TABLET BY MOUTH EVERY DAY FOR BLOOD PRESSURE CONTROL -     hydrochlorothiazide (HYDRODIURIL) 25 MG tablet; ONE TABLET BY MOUTH EVERY MORNING FOR BLOOD PRESSURE CONTROL. -     COMPLETE METABOLIC PANEL WITH GFR  Other depression -     PARoxetine (PAXIL) 40 MG tablet; Take 1 tablet (40 mg total) by mouth every morning.  BRCA positive  Pure hypercholesterolemia -     atorvastatin (LIPITOR) 10 MG tablet; Take 1 tablet (10 mg total) by mouth daily. -     Lipid panel  Colon cancer screening -      Ambulatory referral to Gastroenterology  Menopausal symptoms -     estrogens-methylTEST (ESTRATEST) 1.25-2.5 MG tablet; Take 1 tablet by mouth daily.   Will get A!C in labs. Refilled medications today.  Discussed need for annual eye exam.   Mammogram/colonoscopy ordered.   BP medication not taking. Refilled today. Recheck in 1 month.   Discussed RISK of HRT. Pt is aware. She has tried to go off in the past year but immediately notices symptoms. She has had a hysterectomy and does not need progesterone supplementation.

## 2016-11-21 NOTE — Progress Notes (Signed)
Call pt: A!C is almost to goal. Way to go. Down from 9.2 to 7.3. Goal is under 7. Keep up the good work. Recheck in 3 months.   LDL under 100 which is good.  TG almost to goal.

## 2016-12-01 ENCOUNTER — Ambulatory Visit (INDEPENDENT_AMBULATORY_CARE_PROVIDER_SITE_OTHER): Payer: BLUE CROSS/BLUE SHIELD

## 2016-12-01 DIAGNOSIS — Z1231 Encounter for screening mammogram for malignant neoplasm of breast: Secondary | ICD-10-CM | POA: Diagnosis not present

## 2016-12-01 DIAGNOSIS — Z1501 Genetic susceptibility to malignant neoplasm of breast: Secondary | ICD-10-CM | POA: Diagnosis not present

## 2016-12-08 NOTE — Telephone Encounter (Signed)
See phone other phone note. 

## 2016-12-18 LAB — HM COLONOSCOPY

## 2017-01-07 ENCOUNTER — Encounter: Payer: Self-pay | Admitting: Physician Assistant

## 2017-02-01 ENCOUNTER — Other Ambulatory Visit: Payer: Self-pay | Admitting: Physician Assistant

## 2017-02-01 DIAGNOSIS — F329 Major depressive disorder, single episode, unspecified: Secondary | ICD-10-CM

## 2017-02-01 DIAGNOSIS — F32A Depression, unspecified: Secondary | ICD-10-CM

## 2017-02-02 MED ORDER — ALPRAZOLAM 0.5 MG PO TABS: 0.5000 mg | ORAL_TABLET | Freq: Two times a day (BID) | ORAL | 1 refills | Status: DC | PRN

## 2017-03-10 ENCOUNTER — Other Ambulatory Visit: Payer: Self-pay | Admitting: Physician Assistant

## 2017-03-10 DIAGNOSIS — E119 Type 2 diabetes mellitus without complications: Secondary | ICD-10-CM

## 2017-04-01 ENCOUNTER — Encounter: Payer: Self-pay | Admitting: Emergency Medicine

## 2017-04-01 ENCOUNTER — Emergency Department (INDEPENDENT_AMBULATORY_CARE_PROVIDER_SITE_OTHER)
Admission: EM | Admit: 2017-04-01 | Discharge: 2017-04-01 | Disposition: A | Discharge status: Home / Self Care | Payer: Self-pay | Source: Home / Self Care | Attending: Family Medicine | Admitting: Family Medicine

## 2017-04-01 DIAGNOSIS — R0981 Nasal congestion: Secondary | ICD-10-CM

## 2017-04-01 DIAGNOSIS — L03313 Cellulitis of chest wall: Secondary | ICD-10-CM

## 2017-04-01 DIAGNOSIS — L03211 Cellulitis of face: Secondary | ICD-10-CM

## 2017-04-01 MED ORDER — DOXYCYCLINE HYCLATE 100 MG PO CAPS: 100.0000 mg | ORAL_CAPSULE | Freq: Two times a day (BID) | ORAL | 0 refills | Status: DC

## 2017-04-01 MED ORDER — CHLORHEXIDINE GLUCONATE 4 % EX LIQD: CUTANEOUS | 0 refills | Status: DC

## 2017-04-01 NOTE — ED Provider Notes (Signed)
CSN: [id number]     Arrival date & time 04/01/17  1130 History   First MD Initiated Contact with Patient 04/01/17 1202     Chief Complaint  Patient presents with  . Rash   (Consider location/radiation/quality/duration/timing/severity/associated sxs/prior Treatment) HPI  Kayla Gallagher is a 66 y.o. female presenting to UC with c/o rash located on face, arms, upper back and upper chest that started about 2 weeks ago. 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 Pt notes she gets red painful sores that break open then scab over. She has had similar outbreaks in the past and was advised by her PCP, Dr. Dorothe PeaHommell, it was likely MRSA.   Last outbreak was about 1 year ago.   She thinks current outbreak was started after she developed sinus congestion. She has bilateral ear pressure and frontal sinus pressure.  Denies fever, chills, n/v/d.    Past Medical History:  Diagnosis Date  . Depression   . Diabetes mellitus without complication (HCC)   . Essential hypertension, benign 01/07/2013  . Headache(784.0)   . History of anemia   . History of breast cancer   . History of hepatitis C 01/07/2013  . Hyperlipidemia   . Hypertension    Does not see cardiology   Past Surgical History:  Procedure Laterality Date  . ABDOMINAL HYSTERECTOMY  1976  . ABDOMINAL HYSTERECTOMY    . ARTERY BIOPSY Right 06/30/2013   Procedure: BIOPSY TEMPORAL ARTERY;  Surgeon: Larina Earthlyodd F Early, MD;  [facility];  Service: Vascular;  Laterality: Right;  . BREAST REDUCTION SURGERY    . BUNIONECTOMY    . tummy tuck     Family History  Problem Relation Age of Onset  . Heart attack Father   . Diabetes Father        grandmother  . Cancer Father   . Heart disease Father   . Hyperlipidemia Father   . Hypertension Father   . Cancer Mother   . Alcoholism Unknown        parents  . Prostate cancer Unknown        grandfather  . Hyperlipidemia Unknown        grandfather  . Breast cancer Sister    Social  History  Substance Use Topics  . Smoking status: Never Smoker  . Smokeless tobacco: Never Used  . Alcohol use No   OB History    No data available     Review of Systems  Constitutional: Negative for chills and fever.  HENT: Positive for congestion, ear pain ( pressure) and sinus pressure. Negative for rhinorrhea, sinus pain and sore throat.   Gastrointestinal: Negative for diarrhea, nausea and vomiting.  Skin: Positive for rash and wound.  Neurological: Positive for headaches. Negative for dizziness and light-headedness.    Allergies  Morphine and related  Home Medications   Prior to Admission medications   Medication Sig Start Date End Date Taking? Authorizing Provider  ALPRAZolam Prudy Feeler(XANAX) 0.5 MG tablet Take 1 tablet (0.5 mg total) by mouth 2 (two) times daily as needed for anxiety. 02/02/17   Breeback, Jade L, PA-C  amLODipine (NORVASC) 10 MG tablet ONE TABLET BY MOUTH EVERY DAY FOR BLOOD PRESSURE CONTROL 11/20/16   Breeback, Jade L, PA-C  atorvastatin (LIPITOR) 10 MG tablet Take 1 tablet (10 mg total) by mouth daily. 11/20/16   Jomarie LongsBreeback, Jade L, PA-C  chlorhexidine (HIBICLENS) 4 % external liquid Use as body wash every 2-3 days to reduce MRSA outbreaks 04/01/17  Junius Finner, PA-C  Cholecalciferol (VITAMIN D-3) 5000 UNITS TABS Take 5,000 Units by mouth daily.    [provider]  doxycycline (VIBRAMYCIN) 100 MG capsule Take 1 capsule (100 mg total) by mouth 2 (two) times daily. One po bid x 10 days 04/01/17   Junius Finner, PA-C  estrogens-methylTEST (ESTRATEST) 1.25-2.5 MG tablet Take 1 tablet by mouth daily. 11/20/16   Breeback, Jade L, PA-C  hydrochlorothiazide (HYDRODIURIL) 25 MG tablet ONE TABLET BY MOUTH EVERY MORNING FOR BLOOD PRESSURE CONTROL. 11/20/16   Breeback, Jade L, PA-C  lisinopril (PRINIVIL,ZESTRIL) 40 MG tablet Take 1 tablet (40 mg total) by mouth 2 (two) times daily. 11/20/16   Breeback, Lonna Cobb, PA-C  metFORMIN (GLUCOPHAGE) 1000 MG tablet Take 1 tablet (1,000 mg  total) by mouth 2 (two) times daily with a meal. 11/20/16   Breeback, Jade L, PA-C  Multiple Vitamins-Minerals (MULTIVITAMIN PO) Take 1 tablet by mouth daily.    [provider]  PARoxetine (PAXIL) 40 MG tablet Take 1 tablet (40 mg total) by mouth every morning. 11/20/16   Breeback, Jade L, PA-C  pioglitazone (ACTOS) 45 MG tablet Take 1 tablet (45 mg total) by mouth daily. Due for follow up visit 03/13/17   Tandy Gaw L, PA-C   Meds Ordered and Administered this Visit  Medications - No data to display  BP (!) 143/84 (BP Location: Left Arm)   Pulse 86   Temp 98.6 F (37 C) (Oral)   Resp 16   Ht 5\' 9"  (1.753 m)   Wt 208 lb 8 oz (94.6 kg)   SpO2 96%   BMI 30.79 kg/m  No data found.   Physical Exam  Constitutional: She is oriented to person, place, and time. She appears well-developed and well-nourished. No distress.  HENT:  Head: Normocephalic. Head is with abrasion.    Right Ear: Tympanic membrane normal.  Left Ear: Tympanic membrane normal.  Nose: Nose normal.  Mouth/Throat: Uvula is midline, oropharynx is clear and moist and mucous membranes are normal.  Erythematous sores that have scabbed over on Left side of face.   Eyes: EOM are normal.  Neck: Normal range of motion.  Cardiovascular: Normal rate and regular rhythm.   Pulmonary/Chest: Effort normal and breath sounds normal. No respiratory distress. She has no wheezes. She has no rales.     She exhibits no tenderness.    Diffuse erythematous sores at various stages of healing. Minimally tender.  Musculoskeletal: Normal range of motion.  Neurological: She is alert and oriented to person, place, and time.  Skin: Skin is warm and dry. She is not diaphoretic.  Psychiatric: She has a normal mood and affect. Her behavior is normal.  Nursing note and vitals reviewed.   Urgent Care Course     Procedures (including critical care time)  Labs Review Labs Reviewed - No data to display  Imaging Review No results  found.    MDM   1. Cellulitis of face   2. Sinus congestion   3. Cellulitis of chest wall    Pt medical records reviewed. Hx of same. She has done well on Doxycycline and has been prescribed chlorhexidine body wash in the past to help prevent outbreaks.  Rx: doxycycline and chlorhexidine  F/u with PCP as needed, especially if not improving in 1 week. Patient verbalized understanding and agreement with treatment plan.     Junius Finner, PA-C 04/01/17 1221

## 2017-04-01 NOTE — ED Triage Notes (Signed)
[facility]/O a rash that is located on face,arms,chest, trunk. 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 New episode has been over the last two week.

## 2017-04-11 ENCOUNTER — Other Ambulatory Visit: Payer: Self-pay | Admitting: Physician Assistant

## 2017-04-11 DIAGNOSIS — E119 Type 2 diabetes mellitus without complications: Secondary | ICD-10-CM

## 2017-04-27 ENCOUNTER — Encounter: Payer: Self-pay | Admitting: Physician Assistant

## 2017-04-27 ENCOUNTER — Ambulatory Visit (INDEPENDENT_AMBULATORY_CARE_PROVIDER_SITE_OTHER): Payer: BLUE CROSS/BLUE SHIELD | Admitting: Physician Assistant

## 2017-04-27 VITALS — BP 142/88 | HR 96 | Ht 69.0 in | Wt 208.0 lb

## 2017-04-27 DIAGNOSIS — E119 Type 2 diabetes mellitus without complications: Secondary | ICD-10-CM

## 2017-04-27 DIAGNOSIS — E7800 Pure hypercholesterolemia, unspecified: Secondary | ICD-10-CM

## 2017-04-27 DIAGNOSIS — Z23 Encounter for immunization: Secondary | ICD-10-CM | POA: Diagnosis not present

## 2017-04-27 DIAGNOSIS — N951 Menopausal and female climacteric states: Secondary | ICD-10-CM | POA: Diagnosis not present

## 2017-04-27 DIAGNOSIS — F3289 Other specified depressive episodes: Secondary | ICD-10-CM

## 2017-04-27 DIAGNOSIS — I10 Essential (primary) hypertension: Secondary | ICD-10-CM | POA: Diagnosis not present

## 2017-04-27 LAB — POCT GLYCOSYLATED HEMOGLOBIN (HGB A1C): Hemoglobin A1C: 7.7

## 2017-04-27 MED ORDER — DULAGLUTIDE 0.75 MG/0.5ML ~~LOC~~ SOAJ: SUBCUTANEOUS | 2 refills | Status: DC

## 2017-04-27 MED ORDER — AMLODIPINE BESYLATE 10 MG PO TABS: ORAL_TABLET | ORAL | 1 refills | Status: DC

## 2017-04-27 MED ORDER — ATORVASTATIN CALCIUM 10 MG PO TABS: 10.0000 mg | ORAL_TABLET | Freq: Every day | ORAL | 1 refills | Status: DC

## 2017-04-27 MED ORDER — LISINOPRIL 40 MG PO TABS: 40.0000 mg | ORAL_TABLET | Freq: Two times a day (BID) | ORAL | 1 refills | Status: DC

## 2017-04-27 MED ORDER — PAROXETINE HCL 40 MG PO TABS: 40.0000 mg | ORAL_TABLET | Freq: Every morning | ORAL | 1 refills | Status: DC

## 2017-04-27 MED ORDER — PIOGLITAZONE HCL 45 MG PO TABS: 45.0000 mg | ORAL_TABLET | Freq: Every day | ORAL | 0 refills | Status: DC

## 2017-04-27 MED ORDER — EST ESTROGENS-METHYLTEST 1.25-2.5 MG PO TABS: 1.0000 | ORAL_TABLET | Freq: Every day | ORAL | 3 refills | Status: DC

## 2017-04-27 MED ORDER — HYDROCHLOROTHIAZIDE 25 MG PO TABS: ORAL_TABLET | ORAL | 1 refills | Status: DC

## 2017-04-27 MED ORDER — METFORMIN HCL 1000 MG PO TABS: 1000.0000 mg | ORAL_TABLET | Freq: Two times a day (BID) | ORAL | 0 refills | Status: DC

## 2017-04-27 NOTE — Progress Notes (Signed)
Subjective:    Patient ID: Kayla Gallagher, female    DOB: Mar 29, 1951, 66 y.o.   MRN: [id number]  HPI  Pt is a 66 yo female who presents to the clinic for 6 month follow up and medication refills.   DM- not checking sugars. Not taking metformin as she is supposed to. She takes 1/2 tablet in am and usually forgets evening dose. She is taking actos daily. No hypoglycemia. No open sores or wounds. She admits to drinking sodas and not keeping to a diabetic diet. She is not exercising.   Her mood is well controlled on paxil. No problems or concerns.   HTN- not checking BP's. Denies any CP, palpitations, headaches, or vision changes. She takes lisionpril 72m daily.    .. Active Ambulatory Problems    Diagnosis Date Noted  . History of hepatitis C 01/07/2013  . Essential hypertension, benign 01/07/2013  . Hyperlipidemia 01/07/2013  . History of breast cancer 01/07/2013  . Depression 01/07/2013  . 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  . Hyperplastic colon polyp 01/22/2013  . [place] 02/21/2013  . Breast mass, right 02/21/2013  . Actinic keratosis 04/16/2013  . Temporal arteritis ([place] 06/18/2013  . Strain of elbow, left 06/01/2014  . Anxiety state 05/04/2015  . Obesity 09/09/2015   Resolved Ambulatory Problems    Diagnosis Date Noted  . No Resolved Ambulatory Problems   Past Medical History:  Diagnosis Date  . Depression   . [place]   . Essential hypertension, benign 01/07/2013  . Headache(784.0)   . History of anemia   . History of breast cancer   . History of hepatitis C 01/07/2013  . Hyperlipidemia   . Hypertension       Review of Systems  All other systems reviewed and are negative.      Objective:   Physical Exam  Constitutional: She is oriented to person, place, and time. She appears well-developed and well-nourished.  HENT:  Head: Normocephalic and atraumatic.  Neck: Normal range of motion. Neck supple. No  thyromegaly present.  Cardiovascular: Normal rate, regular rhythm and normal heart sounds.   Pulmonary/Chest: Effort normal and breath sounds normal.  Neurological: She is alert and oriented to person, place, and time.  Psychiatric: She has a normal mood and affect. Her behavior is normal.          Assessment & Plan:  .[name] KitchenKaylywas seen today for diabetes.  Diagnoses and all orders for this visit:  Type 2 diabetes mellitus without complication, without long-term current use of insulin (HCC) -     POCT HgB A1C -     metFORMIN (GLUCOPHAGE) 1000 MG tablet; Take 1 tablet (1,000 mg total) by mouth 2 (two) times daily with a meal. -     pioglitazone (ACTOS) 45 MG tablet; Take 1 tablet (45 mg total) by mouth daily.  Need for Tdap vaccination -     Tdap vaccine greater than or equal to 7yo IM  Essential hypertension, benign -     amLODipine (NORVASC) 10 MG tablet; ONE TABLET BY MOUTH EVERY DAY FOR BLOOD PRESSURE CONTROL -     hydrochlorothiazide (HYDRODIURIL) 25 MG tablet; ONE TABLET BY MOUTH EVERY MORNING FOR BLOOD PRESSURE CONTROL. -     lisinopril (PRINIVIL,ZESTRIL) 40 MG tablet; Take 1 tablet (40 mg total) by mouth 2 (two) times daily.  Pure hypercholesterolemia -     atorvastatin (LIPITOR) 10 MG tablet; Take 1 tablet (10 mg total) by mouth  daily.  Menopausal symptoms -     estrogens-methylTEST (ESTRATEST) 1.25-2.5 MG tablet; Take 1 tablet by mouth daily.  Other depression -     PARoxetine (PAXIL) 40 MG tablet; Take 1 tablet (40 mg total) by mouth every morning.  Other orders -     Discontinue: Dulaglutide (TRULICITY) 6.83 MH/9.6QI SOPN; Inject [place] once weekly.  .. Depression screen [facility] 2/9 04/28/2017  Decreased Interest 0  Down, Depressed, Hopeless 0  PHQ - 2 Score 0    .. Lab Results  Component Value Date   HGBA1C 7.7 04/27/2017   a1c is not at goal. Pt is aware. She has not been taking current medications like she should.  She is not a candidate for trulicity due to  hx of pancreatitis.  She did not want to try a GLT-2.  Discussed stopping drinking sodas and limiting other carbs and sugars.  If a1c still above 7 at next visit agrees to add a medication.  On STATIN.  On ACE.  Not at BP goal. Discussed lifestyle changes.  Encouraged eye exam.  Pt declined pneumonia vaccines.

## 2017-04-28 ENCOUNTER — Encounter: Payer: Self-pay | Admitting: Physician Assistant

## 2017-05-27 ENCOUNTER — Emergency Department (INDEPENDENT_AMBULATORY_CARE_PROVIDER_SITE_OTHER)
Admission: EM | Admit: 2017-05-27 | Discharge: 2017-05-27 | Disposition: A | Discharge status: Home / Self Care | Payer: BLUE CROSS/BLUE SHIELD | Source: Home / Self Care | Attending: Family Medicine | Admitting: Family Medicine

## 2017-05-27 DIAGNOSIS — R21 Rash and other nonspecific skin eruption: Secondary | ICD-10-CM | POA: Diagnosis not present

## 2017-05-27 DIAGNOSIS — Z8614 Personal history of Methicillin resistant Staphylococcus aureus infection: Secondary | ICD-10-CM

## 2017-05-27 MED ORDER — CHLORHEXIDINE GLUCONATE 4 % EX LIQD: CUTANEOUS | 0 refills | Status: DC

## 2017-05-27 MED ORDER — DOXYCYCLINE HYCLATE 100 MG PO CAPS: 100.0000 mg | ORAL_CAPSULE | Freq: Two times a day (BID) | ORAL | 0 refills | Status: DC

## 2017-05-27 NOTE — ED Triage Notes (Signed)
Pt has a rash on her face.  Was seen here a little over a month ago, use cleanse and medication, it went away.  Now it is back

## 2017-05-27 NOTE — ED Provider Notes (Signed)
CSN: [id number]     Arrival date & time 05/27/17  1256 History   First MD Initiated Contact with Patient 05/27/17 1313     Chief Complaint  Patient presents with  . Rash    face   (Consider location/radiation/quality/duration/timing/severity/associated sxs/prior Treatment) HPI  KABAO LEITE is a 66 y.o. female presenting to UC with c/o recurrent rash to her face that is c/w prior outbreaks of MRSA for about 1 week.  Pt was seen at [facility] in May 2018 at [facility] for same and about 1 year prior for same by her PCP at the time, Dr. Ivan Anchors.  She does well with doxycycline and chlorhexidine body wash.  She wanted to come in before rash worsened. Denies fever, chills, n/v/d. She is not sure what triggers the outbreaks. Rash is limited to chest and face at this time. Spots are mildly tender. She does not have a dermatologist.    Past Medical History:  Diagnosis Date  . Depression   . Diabetes mellitus without complication (HCC)   . Essential hypertension, benign 01/07/2013  . Headache(784.0)   . History of anemia   . History of breast cancer   . History of hepatitis C 01/07/2013  . Hyperlipidemia   . Hypertension    Does not see cardiology   Past Surgical History:  Procedure Laterality Date  . ABDOMINAL HYSTERECTOMY  1976  . ABDOMINAL HYSTERECTOMY    . ARTERY BIOPSY Right 06/30/2013   Procedure: BIOPSY TEMPORAL ARTERY;  Surgeon: Larina Earthly, MD;  Location: [facility] OR;  Service: Vascular;  Laterality: Right;  . BREAST REDUCTION SURGERY    . BUNIONECTOMY    . tummy tuck     Family History  Problem Relation Age of Onset  . Heart attack Father   . Diabetes Father        grandmother  . Cancer Father   . Heart disease Father   . Hyperlipidemia Father   . Hypertension Father   . Cancer Mother   . Alcoholism Unknown        parents  . Prostate cancer Unknown        grandfather  . Hyperlipidemia Unknown        grandfather  . Breast cancer Sister    Social History  Substance Use Topics  .  Smoking status: Never Smoker  . Smokeless tobacco: Never Used  . Alcohol use No   OB History    No data available     Review of Systems  Constitutional: Negative for chills and fever.  Gastrointestinal: Negative for diarrhea, nausea and vomiting.  Musculoskeletal: Negative for arthralgias, joint swelling and myalgias.  Skin: Positive for color change, rash and wound.    Allergies  Morphine and related  Home Medications   Prior to Admission medications   Medication Sig Start Date End Date Taking? Authorizing Provider  ALPRAZolam Prudy Feeler) 0.5 MG tablet Take 1 tablet (0.5 mg total) by mouth 2 (two) times daily as needed for anxiety. 02/02/17   Breeback, Jade L, PA-C  amLODipine (NORVASC) 10 MG tablet ONE TABLET BY MOUTH EVERY DAY FOR BLOOD PRESSURE CONTROL 04/27/17   Breeback, Jade L, PA-C  atorvastatin (LIPITOR) 10 MG tablet Take 1 tablet (10 mg total) by mouth daily. 04/27/17   Jomarie Longs, PA-C  chlorhexidine (HIBICLENS) 4 % external liquid Use as body wash every 2-3 days to reduce MRSA outbreaks 05/27/17   Lurene Shadow, PA-C  Cholecalciferol (VITAMIN D-3) 5000 UNITS TABS Take 5,000 Units by  mouth daily.    [provider]  doxycycline (VIBRAMYCIN) 100 MG capsule Take 1 capsule (100 mg total) by mouth 2 (two) times daily. One po bid x 7 days 05/27/17   Lurene ShadowPhelps, Kevonta Phariss O, PA-C  estrogens-methylTEST (ESTRATEST) 1.25-2.5 MG tablet Take 1 tablet by mouth daily. 04/27/17   Breeback, Jade L, PA-C  hydrochlorothiazide (HYDRODIURIL) 25 MG tablet ONE TABLET BY MOUTH EVERY MORNING FOR BLOOD PRESSURE CONTROL. 04/27/17   Breeback, Jade L, PA-C  lisinopril (PRINIVIL,ZESTRIL) 40 MG tablet Take 1 tablet (40 mg total) by mouth 2 (two) times daily. 04/27/17   Jomarie LongsBreeback, Jade L, PA-C  metFORMIN (GLUCOPHAGE) 1000 MG tablet Take 1 tablet (1,000 mg total) by mouth 2 (two) times daily with a meal. 04/27/17   Breeback, Jade L, PA-C  Multiple Vitamins-Minerals (MULTIVITAMIN PO) Take 1 tablet by mouth  daily.    [provider]  PARoxetine (PAXIL) 40 MG tablet Take 1 tablet (40 mg total) by mouth every morning. 04/27/17   Breeback, Jade L, PA-C  pioglitazone (ACTOS) 45 MG tablet Take 1 tablet (45 mg total) by mouth daily. 04/27/17   Breeback, Lonna CobbJade L, PA-C   Meds Ordered and Administered this Visit  Medications - No data to display  BP (!) 142/79 (BP Location: Left Arm)   Pulse 86   Temp 98.4 F (36.9 C) (Oral)   Ht 5\' 9"  (1.753 m)   Wt 207 lb (93.9 kg)   SpO2 95%   BMI 30.57 kg/m  No data found.   Physical Exam  Constitutional: She is oriented to person, place, and time. She appears well-developed and well-nourished. No distress.  HENT:  Head: Normocephalic and atraumatic.  Eyes: EOM are normal.  Neck: Normal range of motion.  Cardiovascular: Normal rate.   Pulmonary/Chest: Effort normal.  Musculoskeletal: Normal range of motion.  Neurological: She is alert and oriented to person, place, and time.  Skin: Skin is warm and dry. Rash noted. She is not diaphoretic. There is erythema.     Small diffuse erythematous sores to face and one on upper anterior chest. Small scabs. No active bleeding or discharge. Lesions are about 2-575mm in size.  Psychiatric: She has a normal mood and affect. Her behavior is normal.  Nursing note and vitals reviewed.   Urgent Care Course     Procedures (including critical care time)  Labs Review Labs Reviewed - No data to display  Imaging Review No results found.   MDM   1. Rash and nonspecific skin eruption   2. History of MRSA infection    Pt c/o recurrent outbreak of MRSA  Rx: Doxycycline and chlorhexidine wash (has done well with this in the past) Encouraged f/u with PCP if not improving, may need referral to dermatologist, especially for more frequent outbreaks.     Lurene Shadowhelps, Graceann Boileau O, [place]PA-C 05/27/17 1406

## 2017-06-01 ENCOUNTER — Encounter: Payer: Self-pay | Admitting: Osteopathic Medicine

## 2017-06-01 ENCOUNTER — Ambulatory Visit (INDEPENDENT_AMBULATORY_CARE_PROVIDER_SITE_OTHER): Payer: BLUE CROSS/BLUE SHIELD | Admitting: Osteopathic Medicine

## 2017-06-01 VITALS — BP 114/77 | HR 100 | Temp 98.1°F | Wt 200.0 lb

## 2017-06-01 DIAGNOSIS — R197 Diarrhea, unspecified: Secondary | ICD-10-CM | POA: Diagnosis not present

## 2017-06-01 DIAGNOSIS — A09 Infectious gastroenteritis and colitis, unspecified: Secondary | ICD-10-CM

## 2017-06-01 DIAGNOSIS — K625 Hemorrhage of anus and rectum: Secondary | ICD-10-CM | POA: Diagnosis not present

## 2017-06-01 LAB — COMPLETE METABOLIC PANEL WITH GFR
ALBUMIN: 4.2 g/dL (ref 3.6–5.1)
ALK PHOS: 60 U/L (ref 33–130)
ALT: 13 U/L (ref 6–29)
AST: 17 U/L (ref 10–35)
BILIRUBIN TOTAL: 0.4 mg/dL (ref 0.2–1.2)
BUN: 25 mg/dL (ref 7–25)
CO2: 24 mmol/L (ref 20–31)
Calcium: 9.4 mg/dL (ref 8.6–10.4)
Chloride: 98 mmol/L (ref 98–110)
Creat: 1.52 mg/dL — ABNORMAL HIGH (ref 0.50–0.99)
GFR, EST NON AFRICAN AMERICAN: 35 mL/min — AB (ref >=60)
GFR, Est African American: 41 mL/min — ABNORMAL LOW (ref >=60)
GLUCOSE: 178 mg/dL — AB (ref 65–99)
Potassium: 3.8 mmol/L (ref 3.5–5.3)
SODIUM: 138 mmol/L (ref 135–146)
Total Protein: 7.3 g/dL (ref 6.1–8.1)

## 2017-06-01 LAB — CBC
HCT: 44.9 % (ref 35.0–45.0)
HEMOGLOBIN: 15 g/dL (ref 11.7–15.5)
MCH: 30.1 pg (ref 27.0–33.0)
MCHC: 33.4 g/dL (ref 32.0–36.0)
MCV: 90.2 fL (ref 80.0–100.0)
MPV: 10.7 fL (ref 7.5–12.5)
PLATELETS: 315 10*3/uL (ref 140–400)
RBC: 4.98 MIL/uL (ref 3.80–5.10)
RDW: 13.6 % (ref 11.0–15.0)
WBC: 17 10*3/uL — ABNORMAL HIGH (ref 3.8–10.8)

## 2017-06-01 LAB — POCT HEMOGLOBIN: HEMOGLOBIN: 14.4 g/dL (ref 12.2–16.2)

## 2017-06-01 MED ORDER — CIPROFLOXACIN HCL 500 MG PO TABS: 500.0000 mg | ORAL_TABLET | Freq: Two times a day (BID) | ORAL | 0 refills | Status: DC

## 2017-06-01 MED ORDER — METRONIDAZOLE 500 MG PO TABS: 500.0000 mg | ORAL_TABLET | Freq: Three times a day (TID) | ORAL | 0 refills | Status: DC

## 2017-06-01 NOTE — Progress Notes (Signed)
HPI: Kayla Gallagher is a 66 y.o. female  who presents to [facility] today, 06/01/17,  for chief complaint of:  Chief Complaint  Patient presents with  . Rectal Bleeding   . Recent GI illness w/ throwing up and diarrhea x3 days. (+)sick contact at work w/ similar but less severe symptoms.   Current concern is rectal bleeding and LLQ abdominal pain. Diarrhea and vomiting has resolved but pain and bleeding with BM is still present.    Past medical history, surgical history, social history and family history reviewed.  Patient Active Problem List   Diagnosis Date Noted  . Obesity 09/09/2015  . Anxiety state 05/04/2015  . Strain of elbow, left 06/01/2014  . Temporal arteritis ([place]) 06/18/2013  . Actinic keratosis 04/16/2013  . Type 2 diabetes mellitus ([place]) 02/21/2013  . Breast mass, right 02/21/2013  . Hyperplastic colon polyp 01/22/2013  . 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  . History of hepatitis C 01/07/2013  . Essential hypertension, benign 01/07/2013  . Hyperlipidemia 01/07/2013  . History of breast cancer 01/07/2013  . Depression 01/07/2013    Current medication list and allergy/intolerance information reviewed.   Current Outpatient Prescriptions on File Prior to Visit  Medication Sig Dispense Refill  . ALPRAZolam (XANAX) 0.5 MG tablet Take 1 tablet (0.5 mg total) by mouth 2 (two) times daily as needed for anxiety. 20 tablet 1  . amLODipine (NORVASC) 10 MG tablet ONE TABLET BY MOUTH EVERY DAY FOR BLOOD PRESSURE CONTROL 90 tablet 1  . atorvastatin (LIPITOR) 10 MG tablet Take 1 tablet (10 mg total) by mouth daily. 90 tablet 1  . chlorhexidine (HIBICLENS) 4 % external liquid Use as body wash every 2-3 days to reduce MRSA outbreaks 236 mL 0  . Cholecalciferol (VITAMIN D-3) 5000 UNITS TABS Take 5,000 Units by mouth daily.    Marland Kitchen doxycycline (VIBRAMYCIN) 100 MG capsule Take 1 capsule (100 mg total) by mouth 2 (two) times daily. One po bid x 7 days 14  capsule 0  . estrogens-methylTEST (ESTRATEST) 1.25-2.5 MG tablet Take 1 tablet by mouth daily. 90 tablet 3  . hydrochlorothiazide (HYDRODIURIL) 25 MG tablet ONE TABLET BY MOUTH EVERY MORNING FOR BLOOD PRESSURE CONTROL. 90 tablet 1  . lisinopril (PRINIVIL,ZESTRIL) 40 MG tablet Take 1 tablet (40 mg total) by mouth 2 (two) times daily. 180 tablet 1  . metFORMIN (GLUCOPHAGE) 1000 MG tablet Take 1 tablet (1,000 mg total) by mouth 2 (two) times daily with a meal. 180 tablet 0  . Multiple Vitamins-Minerals (MULTIVITAMIN PO) Take 1 tablet by mouth daily.    Marland Kitchen PARoxetine (PAXIL) 40 MG tablet Take 1 tablet (40 mg total) by mouth every morning. 90 tablet 1  . pioglitazone (ACTOS) 45 MG tablet Take 1 tablet (45 mg total) by mouth daily. 90 tablet 0   No current facility-administered medications on file prior to visit.    Allergies  Allergen Reactions  . Morphine And Related Nausea And Vomiting      Review of Systems:  Constitutional: +recent illness, +recent subjective fever   HEENT: No  headache, no vision change  Cardiac: No  chest pain, No  pressure, No palpitations  Respiratory:  No  Shortness of breath. No  Cough  Gastrointestinal: +abdominal pain, +change in bowel habits, no dark/tarry stools  Musculoskeletal: No new myalgia/arthralgia  Skin: No  Rash  Neurologic: No  weakness, No  Dizziness   Exam:  BP 114/77   Pulse 100   Temp 98.1 F (36.7 C) (  Oral)   Wt 200 lb (90.7 kg)   BMI 29.53 kg/m   Constitutional: VS see above. General Appearance: alert, well-developed, well-nourished, NAD  Eyes: Normal lids and conjunctive, non-icteric sclera  Ears, Nose, Mouth, Throat: MMM, Normal external inspection ears/nares/mouth/lips/gums.  Neck: No masses, trachea midline.   Respiratory: Normal respiratory effort. no wheeze, no rhonchi, no rales  Cardiovascular: S1/S2 normal, no murmur, no rub/gallop auscultated. RRR.   Musculoskeletal: Gait normal. Symmetric and independent  movement of all extremities  Abdominal: (+)TTP LLQ, BS WNL, Rectal (+)Hemoccult but no mass/fissue, no obvious hemorrhoid, no gross blood on glove.   Neurological: Normal balance/coordination. No tremor.  Skin: warm, dry, intact.   Psychiatric: Normal judgment/insight. Normal mood and affect. Oriented x3.    Recent Results (from the past 2160 hour(s))  POCT HgB A1C     Status: None   Collection Time: 04/27/17  8:57 AM  Result Value Ref Range   Hemoglobin A1C 7.7   POCT hemoglobin     Status: Normal   Collection Time: 06/01/17 10:11 AM  Result Value Ref Range   Hemoglobin 14.4 12.2 - 16.2 g/dL     ASSESSMENT/PLAN: Likely colitis, diverticulitis. Pt would like to avoid imaging at this time, ok to treat presumptively and watchful waiting w/ precautions reviewed and risks that we may be missing something serious like bad bleed, abscess, other organ pathology, or other that can risk worsening of illness/death. Pt accepts risk and has capacity to decide - I think watchful waiting is acceptable.   Infectious colitis - Plan: CBC, COMPLETE METABOLIC PANEL WITH GFR, Lipase, metroNIDAZOLE (FLAGYL) 500 MG tablet, ciprofloxacin (CIPRO) 500 MG tablet  Rectal bleeding - Plan: POCT hemoglobin, CBC  Diarrhea of presumed infectious origin - Plan: Stool, WBC/Lactoferrin, Stool Culture, Stool C-Diff Toxin Assay, Ova and parasite examination, metroNIDAZOLE (FLAGYL) 500 MG tablet, ciprofloxacin (CIPRO) 500 MG tablet    Patient Instructions  I think your symptoms are most likely due to colon infection or inflamed diverticular disease of the colon.   To definitively diagnose this, we would need a CT scan of the abdomen and pelvis. If we would like to avoid getting imaging, we can try   antibiotic treatment (prescriptions sent)  diet as below (Clear to Full liquid diet and slow progression to Bland solid foods)  and see how you're doing over the next few days.   Will also get labs to rule out any  serious complications. If significant lab abnormalities, we may ask you to go to the hospital.   If no better, or if worse, this will need further evaluation with a CT scan - go to urgent care or to the emergency department.      Follow-up plan: Return if symptoms worsen or fail to improve.  Visit summary with medication list and pertinent instructions was printed for patient to review, alert [place] if any changes needed. All questions at time of visit were answered - patient instructed to contact office with any additional concerns. ER/RTC precautions were reviewed with the patient and understanding verbalized.

## 2017-06-01 NOTE — Patient Instructions (Signed)
I think your symptoms are most likely due to colon infection or inflamed diverticular disease of the colon.   To definitively diagnose this, we would need a CT scan of the abdomen and pelvis. If we would like to avoid getting imaging, we can try   antibiotic treatment (prescriptions sent)  diet as below (Clear to Full liquid diet and slow progression to Bland solid foods)  and see how you're doing over the next few days.   Will also get labs to rule out any serious complications. If significant lab abnormalities, we may ask you to go to the hospital.   If no better, or if worse, this will need further evaluation with a CT scan - go to urgent care or to the emergency department.      Clear Liquid Diet, Adult A clear liquid diet is a diet that includes only liquids that you can see through. You may need to follow a clear liquid diet if:  You develop a medical condition right before or after you have surgery.  You were not able to eat food for a long period of time.  You had a condition that gave you diarrhea.  You are going to have an exam, such as a colonoscopy, in which instruments will be put into your body to look at parts of your digestive system.  You are going to have bowel surgery.  The usual goals of this diet are:  To rest the stomach and digestive system as much as possible.  To keep you hydrated.  To make sure you get some calories for energy.  To help you return to normal digestion.  Most people need to follow this diet for only a short period of time. What do I need to know about this diet?  A clear liquid is a liquid that you can see through when you hold it up to a light.  A clear liquid diet does not provide all the nutrients that you need. It is important to choose a variety of the liquids that are allowed on this diet. That way, you will get as many nutrients as possible.  If you are not sure whether you can have certain items, ask your health care  provider. What can I have?  Water and flavored water.  Fruit juices that do not have pulp, such as cranberry juice and apple juice.  Tea and coffee without milk or cream.  Clear bouillon or broth.  Broth-based soups that have been strained.  Flavored gelatins.  Honey.  Sugar water.  Frozen ice or frozen ice pops that do not contain milk, yogurt, fruit pieces, or fruit pulp.  Clear sodas.  Clear sports drinks. The items listed above may not be a complete list of recommended liquids. Contact your dietitian for more options. What can I not have?  Juices that have pulp.  Milk.  Cream or cream-based soups.  Yogurt. The items listed above may not be a complete list of liquids to avoid. Contact your dietitian for more information. Summary  A clear liquid diet is a diet that includes only liquids that you can see through.  The goal of this diet is to help you recover by resting your digestive system, keeping you hydrated, and providing nutrients.  Make sure to avoid liquids with milk, cream, or pulp while on this diet. This information is not intended to replace advice given to you by your health care provider. Make sure you discuss any questions you have with your  health care provider. Document Released: 10/30/2005 Document Revised: 06/13/2016 Document Reviewed: 09/26/2013 Elsevier Interactive Patient Education  2018 Elsevier Inc.   Full Liquid Diet A full liquid diet may be used:  To help you transition from a clear liquid diet to a soft diet.  When your body is healing and can only tolerate foods that are easy to digest.  Before or after certain a procedure, test, or surgery (such as stomach or intestinal surgeries).  If you have trouble swallowing or chewing.  A full liquid diet includes fluids and foods that are liquid or will become liquid at room temperature. The full liquid diet gives you the proteins, fluids, salts, and minerals that you need for  energy. If you continue this diet for more than 72 hours, talk to your health care provider about how many calories you need to consume. If you continue the diet for more than 5 days, talk to your health care provider about taking a multivitamin or a nutritional supplement. What do I need to know about a full liquid diet?  You may have any liquid.  You may have any food that becomes a liquid at room temperature. The food is considered a liquid if it can be poured off a spoon at room temperature.  Drink one serving of citrus or vitamin C-enriched fruit juice daily. What foods can I eat? Grains Any grain food that can be pureed in soup (such as crackers, pasta, and rice). Hot cereal (such as farina or oatmeal) that has been blended. Talk to your health care provider or dietitian about these foods. Vegetables Pulp-free tomato or vegetable juice. Vegetables pureed in soup. Fruits Fruit juice, including nectars and juices with pulp. Meats and Other Protein Sources Eggs in custard, eggnog mix, and eggs used in ice cream or pudding. Strained meats, like in baby food, may be allowed. Consult your health care provider. Dairy Milk and milk-based beverages, including milk shakes and instant breakfast mixes. Smooth yogurt. Pureed cottage cheese. Avoid these foods if they are not well tolerated. Beverages All beverages, including liquid nutritional supplements. Ask your health care provider if you can have carbonated beverages. They may not be well tolerated. Condiments Iodized salt, pepper, spices, and flavorings. Cocoa powder. Vinegar, ketchup, yellow mustard, smooth sauces (such as hollandaise, cheese sauce, or white sauce), and soy sauce. Sweets and Desserts Custard, smooth pudding. Flavored gelatin. Tapioca, junket. Plain ice cream, sherbet, fruit ices. Frozen ice pops, frozen fudge pops, pudding pops, and other frozen bars with cream. Syrups, including chocolate syrup. Sugar, honey, jelly. Fats  and Oils Margarine, butter, cream, sour cream, and oils. Other Broth and cream soups. Strained, broth-based soups. The items listed above may not be a complete list of recommended foods or beverages. Contact your dietitian for more options. What foods can I not eat? Grains All breads. Grains are not allowed unless they are pureed into soup. Vegetables Vegetables are not allowed unless they are juiced, or cooked and pureed into soup. Fruits Fruits are not allowed unless they are juiced. Meats and Other Protein Sources Any meat or fish. Cooked or raw eggs. Nut butters. Dairy Cheese. Condiments Stone ground mustards. Fats and Oils Fats that are coarse or chunky. Sweets and Desserts Ice cream or other frozen desserts that have any solids in them or on top, such as nuts, chocolate chips, and pieces of cookies. Cakes. Cookies. Candy. Others Soups with chunks or pieces in them. The items listed above may not be a complete list of foods  and beverages to avoid. Contact your dietitian for more information. This information is not intended to replace advice given to you by your health care provider. Make sure you discuss any questions you have with your health care provider. Document Released: 10/30/2005 Document Revised: 04/06/2016 Document Reviewed: 09/04/2013 Elsevier Interactive Patient Education  2017 Elsevier Inc.   [place] Diet A bland diet consists of foods that do not have a lot of fat or fiber. Foods without fat or fiber are easier for the body to digest. They are also less likely to irritate your mouth, throat, stomach, and other parts of your gastrointestinal tract. A bland diet is sometimes called a BRAT diet. What is my plan? Your health care provider or dietitian may recommend specific changes to your diet to prevent and treat your symptoms, such as:  Eating small meals often.  Cooking food until it is soft enough to chew easily.  Chewing your food well.  Drinking fluids  slowly.  Not eating foods that are very spicy, sour, or fatty.  Not eating citrus fruits, such as oranges and grapefruit.  What do I need to know about this diet?  Eat a variety of foods from the bland diet food list.  Do not follow a bland diet longer than you have to.  Ask your health care provider whether you should take vitamins. What foods can I eat? Grains  Hot cereals, such as cream of wheat. Bread, crackers, or tortillas made from refined white flour. Rice. Vegetables Canned or cooked vegetables. Mashed or boiled potatoes. Fruits Bananas. Applesauce. Other types of cooked or canned fruit with the skin and seeds removed, such as canned peaches or pears. Meats and Other Protein Sources Scrambled eggs. Creamy peanut butter or other nut butters. Lean, well-cooked meats, such as chicken or fish. Tofu. Soups or broths. Dairy Low-fat dairy products, such as milk, cottage cheese, or yogurt. Beverages Water. Herbal tea. Apple juice. Sweets and Desserts Pudding. Custard. Fruit gelatin. Ice cream. Fats and Oils Mild salad dressings. Canola or olive oil. The items listed above may not be a complete list of allowed foods or beverages. Contact your dietitian for more options. What foods are not recommended? Foods and ingredients that are often not recommended include:  Spicy foods, such as hot sauce or salsa.  Fried foods.  Sour foods, such as pickled or fermented foods.  Raw vegetables or fruits, especially citrus or berries.  Caffeinated drinks.  Alcohol.  Strongly flavored seasonings or condiments.  The items listed above may not be a complete list of foods and beverages that are not allowed. Contact your dietitian for more information. This information is not intended to replace advice given to you by your health care provider. Make sure you discuss any questions you have with your health care provider. Document Released: 02/21/2016 Document Revised: 04/06/2016  Document Reviewed: 11/11/2014 Elsevier Interactive Patient Education  2018 [employer].

## 2017-06-02 ENCOUNTER — Other Ambulatory Visit: Payer: Self-pay | Admitting: Physician Assistant

## 2017-06-02 DIAGNOSIS — E119 Type 2 diabetes mellitus without complications: Secondary | ICD-10-CM

## 2017-06-02 LAB — LIPASE: Lipase: 25 U/L (ref 7–60)

## 2017-06-03 ENCOUNTER — Other Ambulatory Visit: Payer: Self-pay | Admitting: Family Medicine

## 2017-06-03 DIAGNOSIS — N951 Menopausal and female climacteric states: Secondary | ICD-10-CM

## 2017-06-04 ENCOUNTER — Ambulatory Visit (INDEPENDENT_AMBULATORY_CARE_PROVIDER_SITE_OTHER): Payer: BLUE CROSS/BLUE SHIELD | Admitting: Osteopathic Medicine

## 2017-06-04 ENCOUNTER — Encounter: Payer: Self-pay | Admitting: Osteopathic Medicine

## 2017-06-04 VITALS — BP 150/84 | HR 78 | Ht 69.0 in | Wt 204.0 lb

## 2017-06-04 DIAGNOSIS — R7989 Other specified abnormal findings of blood chemistry: Secondary | ICD-10-CM

## 2017-06-04 DIAGNOSIS — D72829 Elevated white blood cell count, unspecified: Secondary | ICD-10-CM

## 2017-06-04 DIAGNOSIS — R197 Diarrhea, unspecified: Secondary | ICD-10-CM | POA: Diagnosis not present

## 2017-06-04 LAB — C. DIFFICILE GDH AND TOXIN A/B
C. DIFF TOXIN A/B: NOT DETECTED
C. DIFFICILE GDH: NOT DETECTED

## 2017-06-04 NOTE — Progress Notes (Signed)
HPI: Kayla Gallagher is a 66 y.o. female  who presents to [facility] today, 06/04/17,  for chief complaint of:  Chief Complaint  Patient presents with  . Abdominal Pain    followup from last week given high WBC and Cr   . Recent GI illness w/ throwing up and diarrhea days. (+)sick contact at work w/ similar but less severe symptoms. Seen in office last week with rectal bleeding and LLQ abdominal pain. Diarrhea and vomiting had resolved but pain and bleeding with BM was still present at last visit. We treated for presumed colitis/diverticulitis. She is feeling better today, some mild residual LLQ pain, has started advancing her diet.    Past medical history, surgical history, social history and family history reviewed.  Patient Active Problem List   Diagnosis Date Noted  . Obesity 09/09/2015  . Anxiety state 05/04/2015  . Strain of elbow, left 06/01/2014  . Temporal arteritis ([place]) 06/18/2013  . Actinic keratosis 04/16/2013  . Type 2 diabetes mellitus ([place]) 02/21/2013  . Breast mass, right 02/21/2013  . Hyperplastic colon polyp 01/22/2013  . 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  . History of hepatitis C 01/07/2013  . Essential hypertension, benign 01/07/2013  . Hyperlipidemia 01/07/2013  . History of breast cancer 01/07/2013  . Depression 01/07/2013    Current medication list and allergy/intolerance information reviewed.   Current Outpatient Prescriptions on File Prior to Visit  Medication Sig Dispense Refill  . ALPRAZolam (XANAX) 0.5 MG tablet Take 1 tablet (0.5 mg total) by mouth 2 (two) times daily as needed for anxiety. 20 tablet 1  . amLODipine (NORVASC) 10 MG tablet ONE TABLET BY MOUTH EVERY DAY FOR BLOOD PRESSURE CONTROL 90 tablet 1  . atorvastatin (LIPITOR) 10 MG tablet Take 1 tablet (10 mg total) by mouth daily. 90 tablet 1  . chlorhexidine (HIBICLENS) 4 % external liquid Use as body wash every 2-3 days to reduce MRSA outbreaks 236 mL 0  .  Cholecalciferol (VITAMIN D-3) 5000 UNITS TABS Take 5,000 Units by mouth daily.    . ciprofloxacin (CIPRO) 500 MG tablet Take 1 tablet (500 mg total) by mouth 2 (two) times daily. 14 tablet 0  . estrogens-methylTEST (ESTRATEST) 1.25-2.5 MG tablet TAKE 1 TABLET EVERY DAY 90 tablet 0  . hydrochlorothiazide (HYDRODIURIL) 25 MG tablet ONE TABLET BY MOUTH EVERY MORNING FOR BLOOD PRESSURE CONTROL. 90 tablet 1  . lisinopril (PRINIVIL,ZESTRIL) 40 MG tablet Take 1 tablet (40 mg total) by mouth 2 (two) times daily. 180 tablet 1  . metFORMIN (GLUCOPHAGE) 1000 MG tablet Take 1 tablet (1,000 mg total) by mouth 2 (two) times daily with a meal. 180 tablet 0  . metroNIDAZOLE (FLAGYL) 500 MG tablet Take 1 tablet (500 mg total) by mouth 3 (three) times daily. 21 tablet 0  . Multiple Vitamins-Minerals (MULTIVITAMIN PO) Take 1 tablet by mouth daily.    Marland Kitchen PARoxetine (PAXIL) 40 MG tablet Take 1 tablet (40 mg total) by mouth every morning. 90 tablet 1  . pioglitazone (ACTOS) 45 MG tablet TAKE 1 TABLET (45 MG TOTAL) BY MOUTH DAILY. MUST MAKE APPOINTMENT FOR FUTURE REFILLS. 30 tablet 0   No current facility-administered medications on file prior to visit.    Allergies  Allergen Reactions  . Morphine And Related Nausea And Vomiting      Review of Systems:  Constitutional: +recent illness, subjective fever has resolved  HEENT: No  headache, no vision change  Cardiac: No  chest pain, No  pressure, No palpitations  Respiratory:  No  Shortness of breath. No  Cough  Gastrointestinal: +abdominal pain, +change in bowel habits, no dark/tarry stools - improved from last week   Musculoskeletal: No new myalgia/arthralgia  Skin: No  Rash  Neurologic: No  weakness, No  Dizziness   Exam:  BP (!) 150/84   Pulse 78   Ht '5\' 9"'  (1.753 m)   Wt 204 lb (92.5 kg)   BMI 30.13 kg/m   Constitutional: VS see above. General Appearance: alert, well-developed, well-nourished, NAD  Eyes: Normal lids and conjunctive,  non-icteric sclera  Ears, Nose, Mouth, Throat: MMM, Normal external inspection ears/nares/mouth/lips/gums.  Neck: No masses, trachea midline.   Respiratory: Normal respiratory effort. no wheeze, no rhonchi, no rales  Cardiovascular: S1/S2 normal, no murmur, no rub/gallop auscultated. RRR.   Musculoskeletal: Gait normal. Symmetric and independent movement of all extremities  Abdominal: (+)TTP LLQ but much improved from previous exam, BS WNL, Rectal deferred  Neurological: Normal balance/coordination. No tremor.  Skin: warm, dry, intact.   Psychiatric: Normal judgment/insight. Normal mood and affect. Oriented x3.    Recent Results (from the past 2160 hour(s))  POCT HgB A1C     Status: None   Collection Time: 04/27/17  8:57 AM  Result Value Ref Range   Hemoglobin A1C 7.7   POCT hemoglobin     Status: Normal   Collection Time: 06/01/17 10:11 AM  Result Value Ref Range   Hemoglobin 14.4 12.2 - 16.2 g/dL  CBC     Status: Abnormal   Collection Time: 06/01/17 10:52 AM  Result Value Ref Range   WBC 17.0 (H) 3.8 - 10.8 K/uL   RBC 4.98 3.80 - 5.10 MIL/uL   Hemoglobin 15.0 11.7 - 15.5 g/dL   HCT 44.9 35.0 - 45.0 %   MCV 90.2 80.0 - 100.0 fL   MCH 30.1 27.0 - 33.0 pg   MCHC 33.4 32.0 - 36.0 g/dL   RDW 13.6 11.0 - 15.0 %   Platelets 315 140 - 400 K/uL   MPV 10.7 7.5 - 12.5 fL  COMPLETE METABOLIC PANEL WITH GFR     Status: Abnormal   Collection Time: 06/01/17 10:52 AM  Result Value Ref Range   Sodium 138 135 - 146 mmol/L   Potassium 3.8 3.5 - 5.3 mmol/L   Chloride 98 98 - 110 mmol/L   CO2 24 20 - 31 mmol/L   Glucose, Bld 178 (H) 65 - 99 mg/dL   BUN 25 7 - 25 mg/dL   Creat 1.52 (H) 0.50 - 0.99 mg/dL    Comment:   For patients > or = 67 years of age: The upper reference limit for Creatinine is approximately 13% higher for people identified as African-American.      Total Bilirubin 0.4 0.2 - 1.2 mg/dL   Alkaline Phosphatase 60 33 - 130 U/L   AST 17 10 - 35 U/L   ALT 13 6 -  29 U/L   Total Protein 7.3 6.1 - 8.1 g/dL   Albumin 4.2 3.6 - 5.1 g/dL   Calcium 9.4 8.6 - 10.4 mg/dL   GFR, Est African American 41 (L) >=60 mL/min   GFR, Est Non African American 35 (L) >=60 mL/min  Lipase     Status: None   Collection Time: 06/01/17 10:52 AM  Result Value Ref Range   Lipase 25 7 - 60 U/L     ASSESSMENT/PLAN: Likely colitis, diverticulitis. Pt feeling better, will recheck labs and slowly advance diet, RTC if any change.  Diarrhea of presumed infectious origin  Elevated serum creatinine - Plan: BASIC METABOLIC PANEL WITH GFR  Leukocytosis, unspecified type - Plan: CBC       Follow-up plan: Return if symptoms worsen or fail to improve.  Visit summary with medication list and pertinent instructions was printed for patient to review, alert [place] if any changes needed. All questions at time of visit were answered - patient instructed to contact office with any additional concerns. ER/RTC precautions were reviewed with the patient and understanding verbalized.

## 2017-06-05 LAB — OVA AND PARASITE EXAMINATION: OP: NONE SEEN

## 2017-06-05 LAB — FECAL LACTOFERRIN, QUANT: LACTOFERRIN: POSITIVE

## 2017-06-08 LAB — STOOL CULTURE

## 2017-06-16 ENCOUNTER — Emergency Department (INDEPENDENT_AMBULATORY_CARE_PROVIDER_SITE_OTHER)
Admission: EM | Admit: 2017-06-16 | Discharge: 2017-06-16 | Disposition: A | Discharge status: Home / Self Care | Payer: BLUE CROSS/BLUE SHIELD | Source: Home / Self Care | Attending: Family Medicine | Admitting: Family Medicine

## 2017-06-16 ENCOUNTER — Emergency Department (INDEPENDENT_AMBULATORY_CARE_PROVIDER_SITE_OTHER): Payer: BLUE CROSS/BLUE SHIELD

## 2017-06-16 ENCOUNTER — Encounter: Payer: Self-pay | Admitting: Emergency Medicine

## 2017-06-16 DIAGNOSIS — R5383 Other fatigue: Secondary | ICD-10-CM

## 2017-06-16 DIAGNOSIS — M436 Torticollis: Secondary | ICD-10-CM | POA: Diagnosis not present

## 2017-06-16 DIAGNOSIS — R0789 Other chest pain: Secondary | ICD-10-CM

## 2017-06-16 DIAGNOSIS — R0602 Shortness of breath: Secondary | ICD-10-CM | POA: Diagnosis not present

## 2017-06-16 DIAGNOSIS — M5412 Radiculopathy, cervical region: Secondary | ICD-10-CM

## 2017-06-16 DIAGNOSIS — M542 Cervicalgia: Secondary | ICD-10-CM | POA: Diagnosis not present

## 2017-06-16 LAB — POCT CBC W AUTO DIFF (K'VILLE URGENT CARE)

## 2017-06-16 MED ORDER — PREDNISONE 20 MG PO TABS
ORAL_TABLET | ORAL | 0 refills | Status: DC
Start: 2017-06-16 — End: 2017-07-02

## 2017-06-16 NOTE — Discharge Instructions (Signed)
Apply ice pack for 15 to 20 minutes, 3 to 4 times daily  Continue until pain decreases.  Wear soft cervical collar as needed.

## 2017-06-16 NOTE — ED Provider Notes (Signed)
[name]    CSN: [id number] Arrival date & time: 06/16/17  1102     History   Chief Complaint Chief Complaint  Patient presents with  . Neck Pain    HPI Kayla Gallagher is a 66 y.o. female.   Patient complains of pain in her left neck for about 2 months, distinctly worse during the past 2 weeks, with radiation to her right shoulder and arm.  She recalls no injury or change in activity levels.  She was treated about two weeks ago for presumed colitis/diverticulitis with Cipro and Flagyl, and felt better after treatment.  She remains fatigued however.  No recent fevers, chills, and sweats.  She also complains of soreness in her right lateral chest, but denies cough.   The history is provided by the patient.    Past Medical History:  Diagnosis Date  . Depression   . Diabetes mellitus without complication ([place])   . Essential hypertension, benign 01/07/2013  . Headache(784.0)   . History of anemia   . History of breast cancer   . History of hepatitis C 01/07/2013  . Hyperlipidemia   . Hypertension    Does not see cardiology    Patient Active Problem List   Diagnosis Date Noted  . Obesity 09/09/2015  . Anxiety state 05/04/2015  . Strain of elbow, left 06/01/2014  . Temporal arteritis ([place]) 06/18/2013  . Actinic keratosis 04/16/2013  . Type 2 diabetes mellitus ([place]) 02/21/2013  . Breast mass, right 02/21/2013  . Hyperplastic colon polyp 01/22/2013  . 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  . History of hepatitis C 01/07/2013  . Essential hypertension, benign 01/07/2013  . Hyperlipidemia 01/07/2013  . History of breast cancer 01/07/2013  . Depression 01/07/2013    Past Surgical History:  Procedure Laterality Date  . ABDOMINAL HYSTERECTOMY  1976  . ABDOMINAL HYSTERECTOMY    . ARTERY BIOPSY Right 06/30/2013   Procedure: BIOPSY TEMPORAL ARTERY;  Surgeon: Rosetta Posner, MD;  Location: [facility];  Service: Vascular;  Laterality: Right;  . BREAST REDUCTION SURGERY    .  BUNIONECTOMY    . tummy tuck      OB History    No data available       Home Medications    Prior to Admission medications   Medication Sig Start Date End Date Taking? Authorizing Provider  ALPRAZolam Duanne Moron) 0.5 MG tablet Take 1 tablet (0.5 mg total) by mouth 2 (two) times daily as needed for anxiety. 02/02/17   Breeback, Jade L, PA-C  amLODipine (NORVASC) 10 MG tablet ONE TABLET BY MOUTH EVERY DAY FOR BLOOD PRESSURE CONTROL 04/27/17   Breeback, Jade L, PA-C  atorvastatin (LIPITOR) 10 MG tablet Take 1 tablet (10 mg total) by mouth daily. 04/27/17   Donella Stade, PA-C  chlorhexidine (HIBICLENS) 4 % external liquid Use as body wash every 2-3 days to reduce MRSA outbreaks 05/27/17   Noe Gens, PA-C  Cholecalciferol (VITAMIN D-3) 5000 UNITS TABS Take 5,000 Units by mouth daily.    [provider]  ciprofloxacin (CIPRO) 500 MG tablet Take 1 tablet (500 mg total) by mouth 2 (two) times daily. 06/01/17   Emeterio Reeve, DO  estrogens-methylTEST (ESTRATEST) 1.25-2.5 MG tablet TAKE 1 TABLET EVERY DAY 06/04/17   Breeback, Jade L, PA-C  hydrochlorothiazide (HYDRODIURIL) 25 MG tablet ONE TABLET BY MOUTH EVERY MORNING FOR BLOOD PRESSURE CONTROL. 04/27/17   Breeback, Jade L, PA-C  lisinopril (PRINIVIL,ZESTRIL) 40 MG tablet Take 1 tablet (40 mg total) by mouth  2 (two) times daily. 04/27/17   Donella Stade, PA-C  metFORMIN (GLUCOPHAGE) 1000 MG tablet Take 1 tablet (1,000 mg total) by mouth 2 (two) times daily with a meal. 04/27/17   Breeback, Jade L, PA-C  metroNIDAZOLE (FLAGYL) 500 MG tablet Take 1 tablet (500 mg total) by mouth 3 (three) times daily. 06/01/17   Emeterio Reeve, DO  Multiple Vitamins-Minerals (MULTIVITAMIN PO) Take 1 tablet by mouth daily.    [provider]  PARoxetine (PAXIL) 40 MG tablet Take 1 tablet (40 mg total) by mouth every morning. 04/27/17   Breeback, Jade L, PA-C  pioglitazone (ACTOS) 45 MG tablet TAKE 1 TABLET (45 MG TOTAL) BY MOUTH DAILY. MUST  MAKE APPOINTMENT FOR FUTURE REFILLS. 06/04/17   Breeback, Luvenia Starch L, PA-C  predniSONE (DELTASONE) 20 MG tablet Take one tab by mouth twice daily for 5 days, then one daily. Take with food. 06/16/17   Kandra Nicolas, MD    Family History Family History  Problem Relation Age of Onset  . Heart attack Father   . Diabetes Father        grandmother  . Cancer Father   . Heart disease Father   . Hyperlipidemia Father   . Hypertension Father   . Cancer Mother   . Alcoholism Unknown        parents  . Prostate cancer Unknown        grandfather  . Hyperlipidemia Unknown        grandfather  . Breast cancer Sister     Social History Social History  Substance Use Topics  . Smoking status: Never Smoker  . Smokeless tobacco: Never Used  . Alcohol use No     Allergies   Morphine and related   Review of Systems Review of Systems  Constitutional: Positive for activity change and fatigue. Negative for appetite change, chills, diaphoresis, fever and unexpected weight change.  HENT: Negative.   Eyes: Negative.   Respiratory: Positive for chest tightness. Negative for cough, shortness of breath and wheezing.   Cardiovascular: Negative.   Gastrointestinal: Negative for blood in stool.  Genitourinary: Negative.   Musculoskeletal: Positive for neck pain.  Skin: Negative.   Neurological: Negative for dizziness and headaches.     Physical Exam Triage Vital Signs ED Triage Vitals  Enc Vitals Group     BP 06/16/17 1139 136/82     Pulse Rate 06/16/17 1139 100     Resp --      Temp 06/16/17 1139 98.8 F (37.1 C)     Temp Source 06/16/17 1139 Oral     SpO2 06/16/17 1139 96 %     Weight 06/16/17 1139 207 lb (93.9 kg)     Height 06/16/17 1139 '5\' 9"'  (1.753 m)     Head Circumference --      Peak Flow --      Pain Score 06/16/17 1140 5     Pain Loc --      Pain Edu? --      Excl. in [place]? --    No data found.   Updated Vital Signs BP 136/82 (BP Location: Left Arm)   Pulse 100   Temp  98.8 F (37.1 C) (Oral)   Ht '5\' 9"'  (1.753 m)   Wt 207 lb (93.9 kg)   SpO2 96%   BMI 30.57 kg/m   Visual Acuity Right Eye Distance:   Left Eye Distance:   Bilateral Distance:    Right Eye Near:   Left Eye  Near:    Bilateral Near:     Physical Exam  Constitutional: She appears well-developed and well-nourished. No distress.  HENT:  Head: Normocephalic.  Right Ear: External ear normal.  Left Ear: External ear normal.  Nose: Nose normal.  Mouth/Throat: Oropharynx is clear and moist.  Eyes: Pupils are equal, round, and reactive to light. Conjunctivae are normal.  Neck: Muscular tenderness present. Decreased range of motion present.    Tenderness to palpation right trapezius area extending to right shoulder.  Cardiovascular: Normal heart sounds.   Pulmonary/Chest: Breath sounds normal.     She exhibits tenderness.  Vague tenderness to palpation right posterior/lateral chest as noted on diagram.   Abdominal: Soft. Bowel sounds are normal. There is no hepatosplenomegaly. There is tenderness in the left lower quadrant. There is no rigidity, no rebound and no guarding.  There is vague mild tenderness to palpation left lower quadrant   Lymphadenopathy:  There is tenderness to palpation over the right supraclavicular node.  Neurological: She is alert.  Skin: Skin is warm and dry. No rash noted.     UC Treatments / Results  Labs (all labs ordered are listed, but only abnormal results are displayed) Labs Reviewed  POCT CBC W AUTO DIFF ([facility]):  WBC 6.9; LY 29.0; MO 18.3; GR 52.7; Hgb 8.7; Platelets 209     EKG  EKG Interpretation None       Radiology Dg Chest 2 View  Result Date: 06/16/2017 CLINICAL DATA:  66 year old female with shortness of breath and tightness across the upper chest EXAM: CHEST  2 VIEW COMPARISON:  Prior chest x-ray 06/30/2013 FINDINGS: The lungs are clear and negative for focal airspace consolidation, pulmonary edema or suspicious  pulmonary nodule. Stable mild bronchitic changes. No pleural effusion or pneumothorax. Cardiac and mediastinal contours are within normal limits. No acute fracture or lytic or blastic osseous lesions. The visualized upper abdominal bowel gas pattern is unremarkable. IMPRESSION: No active cardiopulmonary disease. Electronically Signed   By: Jacqulynn Cadet M.D.   On: 06/16/2017 12:51   Dg Cervical Spine Complete  Result Date: 06/16/2017 CLINICAL DATA:  Pt states that for the past 2 months she has had right sided neck stiffness. Denies injury. Denies numbness or tingling in upper extremities. EXAM: CERVICAL SPINE - COMPLETE 4+ VIEW COMPARISON:  None. FINDINGS: Degenerative changes throughout the mid and lower cervical spine, mild to moderate in degree, most prominently at the C4-5 through C6-7 levels with associated disc space narrowings and osseous spurring. Associated mild reversal of the normal cervical spine lordosis. Associated osseous neural foramen narrowings at multiple levels, also at least moderate in degree. No acute or suspicious osseous finding. No fracture line or displaced fracture fragment. Prevertebral soft tissues are normal in thickness. Probable carotid atherosclerosis. IMPRESSION: 1. Degenerative changes of the mid and lower cervical spine, moderate in degree, most prominently at the C4-5 through C6-7 levels. Associated osseous neural foramen narrowings at multiple levels bilaterally with possible associated nerve root impingement. If any radiculopathic symptoms, would consider nonemergent cervical spine MRI for further characterization. 2. No acute findings. 3. Probable carotid atherosclerosis. Electronically Signed   By: Franki Cabot M.D.   On: 06/16/2017 12:53    Procedures Procedures (including critical care time)  Medications Ordered in UC Medications - No data to display   Initial Impression / Assessment and Plan / UC Course  I have reviewed the triage vital signs and the  nursing notes.  Pertinent labs & imaging results that were available  during my care of the patient were reviewed by me and considered in my medical decision making (see chart for details).    Dispensed soft cervical collar. Begin prednisone burst/taper. Apply ice pack for 15 to 20 minutes, 3 to 4 times daily  Continue until pain decreases.  Wear soft cervical collar as needed.  Note Hgb 8.7 on fingerstick CBC (probably errror; Hgb on 06/04/17 was 15.0).  Recommend repeat CBC on next office visit.  Followup with Dr. Aundria Mems for further evaluation and management of cervical pain.    Final Clinical Impressions(s) / UC Diagnoses   Final diagnoses:  Neck pain  Cervical radiculopathy  Other fatigue    New Prescriptions New Prescriptions   PREDNISONE (DELTASONE) 20 MG TABLET    Take one tab by mouth twice daily for 5 days, then one daily. Take with food.     Kandra Nicolas, MD 06/26/17 [phone]

## 2017-06-16 NOTE — ED Triage Notes (Signed)
[facility] Neck Pain x 2 weeks. Patient states she was on antibiotics and neck pain went away and since she is off of them the pain is back.

## 2017-06-22 ENCOUNTER — Ambulatory Visit (INDEPENDENT_AMBULATORY_CARE_PROVIDER_SITE_OTHER): Payer: BLUE CROSS/BLUE SHIELD | Admitting: Sports Medicine

## 2017-06-22 DIAGNOSIS — R1032 Left lower quadrant pain: Secondary | ICD-10-CM

## 2017-06-22 DIAGNOSIS — M5030 Other cervical disc degeneration, unspecified cervical region: Secondary | ICD-10-CM

## 2017-06-22 LAB — COMPREHENSIVE METABOLIC PANEL WITH GFR
Albumin: 4.1 g/dL (ref 3.6–5.1)
Chloride: 96 mmol/L — ABNORMAL LOW (ref 98–110)
Creat: 0.84 mg/dL (ref 0.50–0.99)
Potassium: 4.3 mmol/L (ref 3.5–5.3)
Total Protein: 7.2 g/dL (ref 6.1–8.1)

## 2017-06-22 LAB — COMPREHENSIVE METABOLIC PANEL
ALT: 12 U/L (ref 6–29)
AST: 12 U/L (ref 10–35)
Alkaline Phosphatase: 61 U/L (ref 33–130)
BUN: 17 mg/dL (ref 7–25)
CO2: 26 mmol/L (ref 20–32)
Calcium: 10.4 mg/dL (ref 8.6–10.4)
Glucose, Bld: 235 mg/dL — ABNORMAL HIGH (ref 65–99)
Sodium: 133 mmol/L — ABNORMAL LOW (ref 135–146)
Total Bilirubin: 0.3 mg/dL (ref 0.2–1.2)

## 2017-06-22 LAB — CBC
HCT: 36.3 % (ref 35.0–45.0)
Hemoglobin: 12.1 g/dL (ref 11.7–15.5)
MCH: 29.8 pg (ref 27.0–33.0)
MCHC: 33.3 g/dL (ref 32.0–36.0)
MCV: 89.4 fL (ref 80.0–100.0)
MPV: 11.3 fL (ref 7.5–12.5)
Platelets: 297 10*3/uL (ref 140–400)
RBC: 4.06 MIL/uL (ref 3.80–5.10)
RDW: 13.2 % (ref 11.0–15.0)
WBC: 8.9 10*3/uL (ref 3.8–10.8)

## 2017-06-22 LAB — POCT HEMOGLOBIN: Hemoglobin: 12.1 g/dL — AB (ref 12.2–16.2)

## 2017-06-22 MED ORDER — ONDANSETRON 8 MG PO TBDP: 8.0000 mg | ORAL_TABLET | Freq: Three times a day (TID) | ORAL | 3 refills | Status: DC | PRN

## 2017-06-22 MED ORDER — METRONIDAZOLE 500 MG PO TABS: 500.0000 mg | ORAL_TABLET | Freq: Two times a day (BID) | ORAL | 0 refills | Status: DC

## 2017-06-22 MED ORDER — CIPROFLOXACIN HCL 750 MG PO TABS: 750.0000 mg | ORAL_TABLET | Freq: Two times a day (BID) | ORAL | 0 refills | Status: AC

## 2017-06-22 NOTE — Assessment & Plan Note (Signed)
Multilevel cervical degenerative disc disease, improved significantly with prednisone from urgent care. She does have some right-sided C7 distribution radiculitis. Adding formal physical therapy.  Return in 4-6 weeks for MRI if no better.

## 2017-06-22 NOTE — Progress Notes (Signed)
   Subjective:    I'm seeing this patient as a consultation for:  Dr. Donna ChristenStephen Beese  CC: Neck pain  HPI: This is a pleasant 66 year old female, she had some severe neck pain and went to urgent care, she was given steroids, had an x-ray done, and actually improved to some degree. She does endorse that her symptoms do go down her right arm in a C7 distribution.  At that visit she was also feeling somewhat tired, she did have a history of left lower quadrant abdominal pain treated with a course of Cipro and Flagyl, she was having bleeding at that time. A fingerstick hemoglobin in urgent care showed a hemoglobin of 8. We did another finger stick today and it has come up to 12. She still has left lower quadrant abdominal pain. She was having melena previously. She's also had a recent colonoscopy couple of years ago that only showed some polyps.  Past medical history, Surgical history, Family history not pertinant except as noted below, Social history, Allergies, and medications have been entered into the medical record, reviewed, and no changes needed.   Review of Systems: No headache, visual changes, nausea, vomiting, diarrhea, constipation, dizziness, abdominal pain, skin rash, fevers, chills, night sweats, weight loss, swollen lymph nodes, body aches, joint swelling, muscle aches, chest pain, shortness of breath, mood changes, visual or auditory hallucinations.   Objective:   General: Well Developed, well nourished, and in no acute distress.  Neuro:  Extra-ocular muscles intact, able to move all 4 extremities, sensation grossly intact.  Deep tendon reflexes tested were normal. Psych: Alert and oriented, mood congruent with affect. ENT:  Ears and nose appear unremarkable.  Hearing grossly normal. Neck: Unremarkable overall appearance, trachea midline.  No visible thyroid enlargement. Eyes: Conjunctivae and lids appear unremarkable.  Pupils equal and round. Skin: Warm and dry, no rashes noted.    Cardiovascular: Pulses palpable, no extremity edema. Neck: Negative spurling's Full neck range of motion Grip strength and sensation normal in bilateral hands Strength good C4 to T1 distribution No sensory change to C4 to T1 Reflexes normal Abdomen: Soft, tender to palpation in the left lower quadrant, minimal guarding, no rigidity, no rebound tenderness. No palpable masses.  Fingerstick hemoglobin is 12.  Cervical spine x-rays reviewed and show multilevel degenerative changes. Fairly severe.  Impression and Recommendations:   This case required medical decision making of moderate complexity.  DDD (degenerative disc disease), cervical Multilevel cervical degenerative disc disease, improved significantly with prednisone from urgent care. She does have some right-sided C7 distribution radiculitis. Adding formal physical therapy.  Return in 4-6 weeks for MRI if no better.  Acute abdominal pain in left lower quadrant  Several weeks ago hemoglobin dropped into the eights, it's up to 12 but not quite up to her baseline of 15. She still has some left lower quadrant pain. This is likely diverticulitis. At this point she does need a CT of the abdomen and pelvis with oral and IV contrast, CBC, CMP, amylase, lipase.  Cipro and Flagyl for 14 days.

## 2017-06-22 NOTE — Assessment & Plan Note (Signed)
Several weeks ago hemoglobin dropped into the eights, it's up to 12 but not quite up to her baseline of 15. She still has some left lower quadrant pain. This is likely diverticulitis. At this point she does need a CT of the abdomen and pelvis with oral and IV contrast, CBC, CMP, amylase, lipase.  Cipro and Flagyl for 14 days.

## 2017-06-23 LAB — AMYLASE: Amylase: 31 U/L (ref 21–101)

## 2017-06-23 LAB — LIPASE: Lipase: 55 U/L (ref 7–60)

## 2017-06-26 ENCOUNTER — Ambulatory Visit (HOSPITAL_BASED_OUTPATIENT_CLINIC_OR_DEPARTMENT_OTHER)
Admission: RE | Admit: 2017-06-26 | Discharge: 2017-06-26 | Disposition: A | Discharge status: Home / Self Care | Payer: BLUE CROSS/BLUE SHIELD | Source: Ambulatory Visit | Attending: Sports Medicine | Admitting: Sports Medicine

## 2017-06-26 ENCOUNTER — Encounter (HOSPITAL_BASED_OUTPATIENT_CLINIC_OR_DEPARTMENT_OTHER): Payer: Self-pay

## 2017-06-26 DIAGNOSIS — R1032 Left lower quadrant pain: Secondary | ICD-10-CM | POA: Diagnosis not present

## 2017-06-26 DIAGNOSIS — E278 Other specified disorders of adrenal gland: Secondary | ICD-10-CM | POA: Diagnosis not present

## 2017-06-26 MED ORDER — IOPAMIDOL (ISOVUE-300) INJECTION 61%
100.0000 mL | Freq: Once | INTRAVENOUS | Status: AC | PRN
  Administered 2017-06-26: 100 mL via INTRAVENOUS

## 2017-07-02 ENCOUNTER — Encounter: Payer: Self-pay | Admitting: Sports Medicine

## 2017-07-02 ENCOUNTER — Ambulatory Visit (INDEPENDENT_AMBULATORY_CARE_PROVIDER_SITE_OTHER): Payer: BLUE CROSS/BLUE SHIELD | Admitting: Sports Medicine

## 2017-07-02 ENCOUNTER — Ambulatory Visit (INDEPENDENT_AMBULATORY_CARE_PROVIDER_SITE_OTHER): Payer: BLUE CROSS/BLUE SHIELD | Admitting: Physical Therapy

## 2017-07-02 DIAGNOSIS — R1032 Left lower quadrant pain: Secondary | ICD-10-CM

## 2017-07-02 DIAGNOSIS — R293 Abnormal posture: Secondary | ICD-10-CM

## 2017-07-02 DIAGNOSIS — M5030 Other cervical disc degeneration, unspecified cervical region: Secondary | ICD-10-CM | POA: Diagnosis not present

## 2017-07-02 DIAGNOSIS — M542 Cervicalgia: Secondary | ICD-10-CM | POA: Diagnosis present

## 2017-07-02 NOTE — Therapy (Signed)
[place], [place], [address] Phone: [phone]   Fax:  [phone]  Physical Therapy Evaluation  Patient Details  Name: Kayla Gallagher MRN: [id number] Date of Birth: 03-07-1951 Referring Provider: Silverio Decamp, MD  Encounter Date: 07/02/2017      PT End of Session - 07/02/17 1023    Visit Number 1   Number of Visits 12   Date for PT Re-Evaluation 08/13/17   Authorization Type BCBS/Medicare   PT Start Time 0932   PT Stop Time 1008   PT Time Calculation (min) 36 min   Activity Tolerance Patient tolerated treatment well   Behavior During Therapy [facility] for tasks assessed/performed      Past Medical History:  Diagnosis Date  . Depression   . Diabetes mellitus without complication ([place])   . Essential hypertension, benign 01/07/2013  . Headache(784.0)   . History of anemia   . History of breast cancer   . History of hepatitis C 01/07/2013  . Hyperlipidemia   . Hypertension    Does not see cardiology    Past Surgical History:  Procedure Laterality Date  . ABDOMINAL HYSTERECTOMY  1976  . ABDOMINAL HYSTERECTOMY    . ARTERY BIOPSY Right 06/30/2013   Procedure: BIOPSY TEMPORAL ARTERY;  Surgeon: Rosetta Posner, MD;  Location: [place];  Service: Vascular;  Laterality: Right;  . BREAST REDUCTION SURGERY    . BUNIONECTOMY    . tummy tuck      There were no vitals filed for this visit.       Subjective Assessment - 07/02/17 0935    Subjective Pt is a 66 y/o female who presents to OPPT for 3 month hx of neck pain.  Pt reports pain began after pushing lawn mower to cut her grass.  Pt reports mowing grass has been trigger for pain, but continues to have persistent pain.   Diagnostic tests xrays: arthritis   Patient Stated Goals improve pain and strength   Currently in Pain? Yes   Pain Score 2   up to 4/10   Pain Location Neck   Pain Orientation Posterior;Right;Left  Rt > Lt   Pain  Descriptors / Indicators Tightness;Spasm;Numbness;Dull   Pain Type Chronic pain   Pain Onset More than a month ago   Pain Frequency Constant   Aggravating Factors  mowing lawn   Pain Relieving Factors isometric neck flexion, ice            [facility] PT Assessment - 07/02/17 0940      Assessment   Medical Diagnosis neck pain   Referring Provider Silverio Decamp, MD   Onset Date/Surgical Date --  3 months   Hand Dominance Right   Next MD Visit 07/02/17   Prior Therapy none     Precautions   Precautions None     Restrictions   Weight Bearing Restrictions No     Balance Screen   Has the patient fallen in the past 6 months No   Has the patient had a decrease in activity level because of a fear of falling?  No   Is the patient reluctant to leave their home because of a fear of falling?  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)     Cognition   Overall Cognitive Status Within Functional Limits for tasks assessed     Observation/Other Assessments   Focus on Therapeutic Outcomes (FOTO)  54 (46% limited; predicted 37% limited)     Posture/Postural Control   Posture/Postural Control Postural limitations   Postural Limitations Rounded Shoulders;Forward head     ROM / Strength   AROM / PROM / Strength AROM;Strength     AROM   AROM Assessment Site Cervical   Cervical Flexion 48   Cervical Extension 40   Cervical - Right Side Bend 27   Cervical - Left Side Bend 30     Strength   Strength Assessment Site Shoulder;Elbow   Right/Left Shoulder Right;Left   Right Shoulder Flexion 5/5   Right Shoulder  ABduction 4/5   Right Shoulder Internal Rotation 5/5   Right Shoulder External Rotation 5/5   Left Shoulder Flexion 5/5   Left Shoulder ABduction 5/5   Left Shoulder Internal Rotation 5/5   Left Shoulder External Rotation 5/5   Right/Left Elbow Right;Left   Right Elbow Flexion 5/5   Right Elbow Extension 5/5   Left Elbow Flexion 5/5   Left Elbow Extension 5/5     Palpation   Palpation comment tenderness and trigger points noted along bil UT and Rt levator scapula     Special Tests    Special Tests Cervical   Cervical Tests Spurling's;Dictraction     Spurling's   Findings Negative     Distraction Test   Comment c/o "numbness Rt C3/4 area"            Objective measurements completed on examination: See above findings.          [facility] Adult PT Treatment/Exercise - 07/02/17 0940      Self-Care   Self-Care Other Self-Care Comments   Other Self-Care Comments  use of ball for myofascial release and self soft tissue mobilization     Exercises   Exercises Neck     Neck Exercises: Seated   Other Seated Exercise scapular retraction 5 x 5 sec     Neck Exercises: Stretches   Upper Trapezius Stretch 1 rep;30 seconds   Upper Trapezius Stretch Limitations bil for HEP instruction   Levator Stretch 1 rep;30 seconds   Levator Stretch Limitations bil for HEP instruction   Other Neck Stretches low doorway stretch x 30 sec for HEP instruction                PT Education - 07/02/17 1022    Education provided Yes   Education Details HEP, initiated discussion about TDN (no handout given)   Person(s) Educated Patient   Methods Explanation;Demonstration;Handout   Comprehension Verbalized understanding;Returned demonstration             PT Long Term Goals - 07/02/17 1030      PT LONG TERM GOAL #1   Title independent with HEP   Time 6   Period Weeks   Status New   Target Date 08/13/17     PT LONG TERM GOAL #2   Title report pain < 2/10 with activity for  improved function   Time 6   Status New   Target Date 08/13/17     PT LONG TERM GOAL #3   Title verbalize  understanding of posture/body mechanics to decrease risk of reinjury    Time 6   Period Weeks   Status New   Target Date 08/13/17     PT LONG TERM GOAL #4   Title perform cervical sidebending bil without increase in pain for improved motion and function   Time 6   Period Weeks   Status New   Target Date 08/13/17                Plan - 2017/07/31 1024    Clinical Impression Statement Pt is a 66 y/o female who presents to OPPT for 3 month history of neck pain.  Pt reports improvement in symptoms but continues to have pain and tightness.  Pt demonstrates trigger points, postural abnormalities and mild ROM deficits affecting function.  Will benefit from PT to address these deficits.   Clinical Presentation Stable   Clinical Decision Making Low   Rehab Potential Good   PT Frequency 2x / week   PT Duration 6 weeks  anticipate d/c in 4-6 weeks   PT Treatment/Interventions ADLs/Self Care Home Management;Cryotherapy;Electrical Stimulation;Moist Heat;Ultrasound;Traction;Therapeutic exercise;Therapeutic activities;Functional mobility training;Patient/family education;Manual techniques;Passive range of motion;Taping;Dry needling   PT Next Visit Plan review HEP, continue posture exercises, manual/modalities/TDN as indicated   Consulted and Agree with Plan of Care Patient      Patient will benefit from skilled therapeutic intervention in order to improve the following deficits and impairments:  Pain, Decreased range of motion, Postural dysfunction, Increased fascial restricitons, Increased muscle spasms  Visit Diagnosis: Cervicalgia - Plan: PT plan of care cert/re-cert  Abnormal posture - Plan: PT plan of care cert/re-cert      [facility] PT PB G-CODES - Jul 31, 2017 1033    Functional Assessment Tool Used  FOTO   Functional Limitations Self care   Self Care Current Status (W4132) At  least 40 percent but less than 60 percent impaired, limited or restricted   Self Care Goal Status (G4010) At least 20 percent but less than 40 percent impaired, limited or restricted       Problem List Patient Active Problem List   Diagnosis Date Noted  . DDD (degenerative disc disease), cervical 06/22/2017  . Acute abdominal pain in left lower quadrant 06/22/2017  . Obesity 09/09/2015  . Anxiety state 05/04/2015  . Strain of elbow, left 06/01/2014  . Temporal arteritis ([place]) 06/18/2013  . Actinic keratosis 04/16/2013  . Type 2 diabetes mellitus ([place]) 02/21/2013  . Breast mass, right 02/21/2013  . Hyperplastic colon polyp 01/22/2013  . 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  . History of hepatitis C 01/07/2013  . Essential hypertension, benign 01/07/2013  . Hyperlipidemia 01/07/2013  . History of breast cancer 01/07/2013  . Depression 01/07/2013      Laureen Abrahams, PT, DPT July 31, 2017 10:35 AM    [facility] [place], [place], [address] Phone: [phone]   Fax:  [phone]  Name: Kayla Gallagher MRN: [id number] Date of Birth: [birth year]

## 2017-07-02 NOTE — Progress Notes (Signed)
  Subjective:    CC: Follow-up  HPI: Cervical spondylosis: Resolved with physical therapy and medications. She is doing PT twice a week and has multiple more sessions.  Left lower quadrant abdominal pain: Initially had a hemoglobin drop, melena which has all resolved. She had a colonoscopy recently. We started antibiotics, Cipro and Flagyl and all of her abdominal pain, bleeding has resolved. A CT of the abdomen and pelvis was overall negative with the exception of significant constipation. She had a benign adrenal nodule.  Past medical history:  Negative.  See flowsheet/record as well for more information.  Surgical history: Negative.  See flowsheet/record as well for more information.  Family history: Negative.  See flowsheet/record as well for more information.  Social history: Negative.  See flowsheet/record as well for more information.  Allergies, and medications have been entered into the medical record, reviewed, and no changes needed.   Review of Systems: No fevers, chills, night sweats, weight loss, chest pain, or shortness of breath.   Objective:    General: Well Developed, well nourished, and in no acute distress.  Neuro: Alert and oriented x3, extra-ocular muscles intact, sensation grossly intact.  HEENT: Normocephalic, atraumatic, pupils equal round reactive to light, neck supple, no masses, no lymphadenopathy, thyroid nonpalpable.  Skin: Warm and dry, no rashes. Cardiac: Regular rate and rhythm, no murmurs rubs or gallops, no lower extremity edema.  Respiratory: Clear to auscultation bilaterally. Not using accessory muscles, speaking in full sentences.  Impression and Recommendations:    Acute abdominal pain in left lower quadrant Resolved with antibiotics, CT abdomen and pelvis was negative. No bleeding, doing well. Hemoglobin was trending up significantly at the last visit, from 8-12, and she continues to do well.  DDD (degenerative disc disease),  cervical Right-sided C7 distribution radiculitis has resolved with physical therapy. Return as needed.

## 2017-07-02 NOTE — Patient Instructions (Signed)
Scapula Adduction With Pectoralis Stretch: Low - Standing   Shoulders at 45 hands even with shoulders, keeping weight through legs, shift weight forward until you feel pull or stretch through the front of your chest. Hold _30__ seconds. Do _3__ times, _2-4__ times per day.   Scapular Retraction (Standing)    With arms at sides, pinch shoulder blades together.  Hold for 5 seconds. Repeat __10__ times per set. Do __1__ sets per session. Do __2-3__ sessions per day.  Levator Scapula Stretch, Sitting    Sit, one hand tucked under hip on side to be stretched, other hand over top of head. Turn head toward other side and look down. Use hand on head to gently stretch neck in that position. Hold _30__ seconds. Repeat _2-3__ times per session. Do _2-3__ sessions per day.  Ear / Shoulder Stretch    Exhaling, move left ear toward left shoulder. Hold position for _20-30__ seconds. Inhaling, bring head back to center. Repeat to other side. Repeat sequence _2-3__ times. Do __2-3_ times per day.

## 2017-07-02 NOTE — Assessment & Plan Note (Signed)
Resolved with antibiotics, CT abdomen and pelvis was negative. No bleeding, doing well. Hemoglobin was trending up significantly at the last visit, from 8-12, and she continues to do well.

## 2017-07-02 NOTE — Assessment & Plan Note (Signed)
Right-sided C7 distribution radiculitis has resolved with physical therapy. Return as needed.

## 2017-07-04 ENCOUNTER — Encounter: Payer: BLUE CROSS/BLUE SHIELD | Admitting: Physical Therapy

## 2017-07-09 ENCOUNTER — Ambulatory Visit (INDEPENDENT_AMBULATORY_CARE_PROVIDER_SITE_OTHER): Payer: BLUE CROSS/BLUE SHIELD | Admitting: Physical Therapy

## 2017-07-09 DIAGNOSIS — R293 Abnormal posture: Secondary | ICD-10-CM

## 2017-07-09 DIAGNOSIS — M542 Cervicalgia: Secondary | ICD-10-CM | POA: Diagnosis not present

## 2017-07-09 NOTE — Therapy (Signed)
[place], [place], [address] Phone: [phone]   Fax:  [phone]  Physical Therapy Treatment  Patient Details  Name: Kayla Gallagher MRN: [id number] Date of Birth: [birth year] Referring Provider: Silverio Decamp, MD  Encounter Date: 07/09/2017      PT End of Session - 07/09/17 0921    Visit Number 2   Number of Visits 12   Date for PT Re-Evaluation 08/13/17   Authorization Type BCBS/Medicare   PT Start Time 0836   PT Stop Time 0933   PT Time Calculation (min) 57 min   Activity Tolerance Patient tolerated treatment well   Behavior During Therapy [facility] for tasks assessed/performed      Past Medical History:  Diagnosis Date  . Depression   . Diabetes mellitus without complication ([place])   . Essential hypertension, benign 01/07/2013  . Headache(784.0)   . History of anemia   . History of breast cancer   . History of hepatitis C 01/07/2013  . Hyperlipidemia   . Hypertension    Does not see cardiology    Past Surgical History:  Procedure Laterality Date  . ABDOMINAL HYSTERECTOMY  1976  . ABDOMINAL HYSTERECTOMY    . ARTERY BIOPSY Right 06/30/2013   Procedure: BIOPSY TEMPORAL ARTERY;  Surgeon: Rosetta Posner, MD;  Location: [place];  Service: Vascular;  Laterality: Right;  . BREAST REDUCTION SURGERY    . BUNIONECTOMY    . tummy tuck      There were no vitals filed for this visit.      Subjective Assessment - 07/09/17 0837    Subjective neck is a little stiff after doing some yardwork this weekend, but not as stiff as it has been.  reports it feels better now.   Patient Stated Goals improve pain and strength   Currently in Pain? Yes   Pain Score 1    Pain Location Neck   Pain Orientation Right;Left;Posterior   Pain Descriptors / Indicators Dull;Tightness;Spasm   Pain Type Chronic pain   Pain Onset More than a month ago   Pain Frequency Constant   Aggravating Factors  mowing  lawn, yardwork   Pain Relieving Factors isometric neck flexion, ice                         [facility] Adult PT Treatment/Exercise - 07/09/17 0840      Neck Exercises: Machines for Strengthening   UBE (Upper Arm Bike) L1 x 6 min (3' fwd/3' bwd)     Neck Exercises: Theraband   Scapula Retraction 15 reps  yellow, standing; 5 sec hold     Modalities   Modalities Moist Heat;Electrical Stimulation     Moist Heat Therapy   Number Minutes Moist Heat 15 Minutes   Moist Heat Location Cervical     Electrical Stimulation   Electrical Stimulation Location neck/upper trap   Electrical Stimulation Action IFC   Electrical Stimulation Parameters to tolerance   Electrical Stimulation Goals Tone;Pain     Manual Therapy   Manual Therapy Soft tissue mobilization;Myofascial release   Manual therapy comments pt prone   Soft tissue mobilization bil upper trap and cervical paraspinals   Myofascial Release bil upper trap     Neck Exercises: Stretches   Upper Trapezius Stretch 2 reps;30 seconds   Upper Trapezius Stretch Limitations bil   Levator Stretch 2 reps;30 seconds   Levator Stretch Limitations bil  Trigger Point Dry Needling - 07/09/17 4010    Consent Given? Yes   Education Handout Provided Yes   Muscles Treated Upper Body Upper trapezius  bil   Upper Trapezius Response Twitch reponse elicited;Palpable increased muscle length              PT Education - 07/09/17 0921    Education provided Yes   Education Details TDN   Person(s) Educated Patient   Methods Explanation;Handout   Comprehension Verbalized understanding             PT Long Term Goals - 07/02/17 1030      PT LONG TERM GOAL #1   Title independent with HEP   Time 6   Period Weeks   Status New   Target Date 08/13/17     PT LONG TERM GOAL #2   Title report pain < 2/10 with activity for improved function   Time 6   Status New   Target Date 08/13/17     PT LONG TERM GOAL #3    Title verbalize understanding of posture/body mechanics to decrease risk of reinjury    Time 6   Period Weeks   Status New   Target Date 08/13/17     PT LONG TERM GOAL #4   Title perform cervical sidebending bil without increase in pain for improved motion and function   Time 6   Period Weeks   Status New   Target Date 08/13/17               Plan - 07/09/17 2725    Clinical Impression Statement Pt tolerated DN well today with twitch responses noted in upper trap bil.  Pt reported decreased pain and tightness with stretches following DN and manual therapy.  Reinforced proper posture with pt, and she verbalized understanding.  No goals met as only 2nd visit.   PT Treatment/Interventions ADLs/Self Care Home Management;Cryotherapy;Electrical Stimulation;Moist Heat;Ultrasound;Traction;Therapeutic exercise;Therapeutic activities;Functional mobility training;Patient/family education;Manual techniques;Passive range of motion;Taping;Dry needling   PT Next Visit Plan assess response to DN, continue posture exercises, manual/modalities/TDN PRN   Consulted and Agree with Plan of Care Patient      Patient will benefit from skilled therapeutic intervention in order to improve the following deficits and impairments:  Pain, Decreased range of motion, Postural dysfunction, Increased fascial restricitons, Increased muscle spasms  Visit Diagnosis: Cervicalgia  Abnormal posture     Problem List Patient Active Problem List   Diagnosis Date Noted  . DDD (degenerative disc disease), cervical 06/22/2017  . Acute abdominal pain in left lower quadrant 06/22/2017  . Obesity 09/09/2015  . Anxiety state 05/04/2015  . Strain of elbow, left 06/01/2014  . Temporal arteritis ([place]) 06/18/2013  . Actinic keratosis 04/16/2013  . Type 2 diabetes mellitus ([place]) 02/21/2013  . Breast mass, right 02/21/2013  . Hyperplastic colon polyp 01/22/2013  . 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  . History of hepatitis C  01/07/2013  . Essential hypertension, benign 01/07/2013  . Hyperlipidemia 01/07/2013  . History of breast cancer 01/07/2013  . Depression 01/07/2013      Laureen Abrahams, PT, DPT 07/09/17 9:24 AM    [facility] [place], [place], [address] Phone: [phone]   Fax:  [phone]  Name: Kayla Gallagher MRN: [id number] Date of Birth: 1950/12/18

## 2017-07-09 NOTE — Patient Instructions (Signed)

## 2017-07-12 ENCOUNTER — Ambulatory Visit (INDEPENDENT_AMBULATORY_CARE_PROVIDER_SITE_OTHER): Payer: BLUE CROSS/BLUE SHIELD | Admitting: Physical Therapy

## 2017-07-12 DIAGNOSIS — R293 Abnormal posture: Secondary | ICD-10-CM | POA: Diagnosis not present

## 2017-07-12 DIAGNOSIS — M542 Cervicalgia: Secondary | ICD-10-CM | POA: Diagnosis not present

## 2017-07-12 NOTE — Therapy (Signed)
[place], [place], [address] Phone: [phone]   Fax:  [phone]  Physical Therapy Treatment  Patient Details  Name: Kayla Gallagher MRN: [id number] Date of Birth: 02/06/1951 Referring Provider: Silverio Decamp, MD  Encounter Date: 07/12/2017      PT End of Session - 07/12/17 0841    Visit Number 3   Number of Visits 12   Date for PT Re-Evaluation 08/13/17   Authorization Type BCBS/Medicare   PT Start Time 0800   PT Stop Time 0855   PT Time Calculation (min) 55 min   Activity Tolerance Patient tolerated treatment well   Behavior During Therapy [facility] for tasks assessed/performed      Past Medical History:  Diagnosis Date  . Depression   . Diabetes mellitus without complication ([place])   . Essential hypertension, benign 01/07/2013  . Headache(784.0)   . History of anemia   . History of breast cancer   . History of hepatitis C 01/07/2013  . Hyperlipidemia   . Hypertension    Does not see cardiology    Past Surgical History:  Procedure Laterality Date  . ABDOMINAL HYSTERECTOMY  1976  . ABDOMINAL HYSTERECTOMY    . ARTERY BIOPSY Right 06/30/2013   Procedure: BIOPSY TEMPORAL ARTERY;  Surgeon: Rosetta Posner, MD;  Location: [place];  Service: Vascular;  Laterality: Right;  . BREAST REDUCTION SURGERY    . BUNIONECTOMY    . tummy tuck      There were no vitals filed for this visit.      Subjective Assessment - 07/12/17 0802    Subjective felt like the needling was very helpful; a little sore after last session but pain is much improved.   Patient Stated Goals improve pain and strength   Currently in Pain? Yes   Pain Score 1    Pain Location Neck   Pain Orientation Left;Posterior   Pain Descriptors / Indicators Dull;Spasm;Tightness   Pain Type Chronic pain   Pain Onset More than a month ago   Pain Frequency Constant   Aggravating Factors  mowing lawn, yardwork   Pain Relieving  Factors isometric neck flexion, ice                         [facility] Adult PT Treatment/Exercise - 07/12/17 0804      Neck Exercises: Machines for Strengthening   UBE (Upper Arm Bike) L2 x 6 min (3' fwd/3' bwd)     Neck Exercises: Theraband   Scapula Retraction 15 reps  yellow, standing; 5 sec hold   Shoulder Extension 15 reps  yellow   Shoulder External Rotation 10 reps   Shoulder External Rotation Limitations yellow; supine   Horizontal ABduction 10 reps   Horizontal ABduction Limitations yellow; supine   Other Theraband Exercises shoulder flexion bil with yellow x 10 reps; supine     Neck Exercises: Supine   Neck Retraction 10 reps;5 secs     Modalities   Modalities Moist Heat;Electrical Stimulation     Moist Heat Therapy   Number Minutes Moist Heat 15 Minutes   Moist Heat Location Cervical     Electrical Stimulation   Electrical Stimulation Location neck/upper trap   Electrical Stimulation Action IFC   Electrical Stimulation Parameters to tolerance   Electrical Stimulation Goals Tone;Pain     Manual Therapy   Manual Therapy Soft tissue mobilization;Myofascial release;Joint mobilization   Joint Mobilization Grades 2-3 lateral and  UPA mobs C3-6   Soft tissue mobilization bil upper trap and cervical paraspinals   Myofascial Release suboccipital release     Neck Exercises: Stretches   Levator Stretch 2 reps;30 seconds   Levator Stretch Limitations bil   Other Neck Stretches supine on foam roll x 3 min                     PT Long Term Goals - 07/02/17 1030      PT LONG TERM GOAL #1   Title independent with HEP   Time 6   Period Weeks   Status New   Target Date 08/13/17     PT LONG TERM GOAL #2   Title report pain < 2/10 with activity for improved function   Time 6   Status New   Target Date 08/13/17     PT LONG TERM GOAL #3   Title verbalize understanding of posture/body mechanics to decrease risk of reinjury    Time 6    Period Weeks   Status New   Target Date 08/13/17     PT LONG TERM GOAL #4   Title perform cervical sidebending bil without increase in pain for improved motion and function   Time 6   Period Weeks   Status New   Target Date 08/13/17               Plan - 07/12/17 0841    Clinical Impression Statement Pt tolerated increase in activity well today and will plan to give as HEP next session.  Continues to have tightness in cervical paraspinals on Lt as well as bil SCM and feel she will benefit from additional needling to these areas.  Progressing well with PT with reports of improved pain and increased activitiy.   PT Treatment/Interventions ADLs/Self Care Home Management;Cryotherapy;Electrical Stimulation;Moist Heat;Ultrasound;Traction;Therapeutic exercise;Therapeutic activities;Functional mobility training;Patient/family education;Manual techniques;Passive range of motion;Taping;Dry needling   PT Next Visit Plan continue posture exercises-add to HEP, manual/modalities/TDN PRN; likely will need additional DN to cervical paraspinals and SCM   Consulted and Agree with Plan of Care Patient      Patient will benefit from skilled therapeutic intervention in order to improve the following deficits and impairments:  Pain, Decreased range of motion, Postural dysfunction, Increased fascial restricitons, Increased muscle spasms  Visit Diagnosis: Cervicalgia  Abnormal posture     Problem List Patient Active Problem List   Diagnosis Date Noted  . DDD (degenerative disc disease), cervical 06/22/2017  . Acute abdominal pain in left lower quadrant 06/22/2017  . Obesity 09/09/2015  . Anxiety state 05/04/2015  . Strain of elbow, left 06/01/2014  . Temporal arteritis ([place]) 06/18/2013  . Actinic keratosis 04/16/2013  . Type 2 diabetes mellitus ([place]) 02/21/2013  . Breast mass, right 02/21/2013  . Hyperplastic colon polyp 01/22/2013  . 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  . History of hepatitis C  01/07/2013  . Essential hypertension, benign 01/07/2013  . Hyperlipidemia 01/07/2013  . History of breast cancer 01/07/2013  . Depression 01/07/2013      Laureen Abrahams, PT, DPT 07/12/17 8:43 AM    [facility] [place], [place], [address] Phone: [phone]   Fax:  [phone]  Name: MOSELLA KASA MRN: [id number] Date of Birth: [birth year]

## 2017-07-17 ENCOUNTER — Ambulatory Visit (INDEPENDENT_AMBULATORY_CARE_PROVIDER_SITE_OTHER): Payer: BLUE CROSS/BLUE SHIELD | Admitting: Physical Therapy

## 2017-07-17 DIAGNOSIS — M542 Cervicalgia: Secondary | ICD-10-CM | POA: Diagnosis not present

## 2017-07-17 DIAGNOSIS — R293 Abnormal posture: Secondary | ICD-10-CM | POA: Diagnosis not present

## 2017-07-17 NOTE — Patient Instructions (Signed)
Over Head Pull: Narrow Grip     [place] [id number]   On back, knees bent, feet flat, band across thighs, elbows straight but relaxed. Pull hands apart (start). Keeping elbows straight, bring arms up and over head, hands toward floor. Keep pull steady on band. Hold momentarily. Return slowly, keeping pull steady, back to start. Repeat _10__ times, 2 sets. Band color _yellow/red____   Side Pull: Double Arm   On back, knees bent, feet flat. Arms perpendicular to body, shoulder level, elbows straight but relaxed. Pull arms out to sides, elbows straight. Resistance band comes across collarbones, hands toward floor. Hold momentarily. Slowly return to starting position. Repeat _10__ times, 2 sets. Band color _yellow/red____   Sash   On back, knees bent, feet flat, left hand on left hip, right hand above left. Pull right arm DIAGONALLY (hip to shoulder) across chest. Bring right arm along head toward floor. Hold momentarily. Slowly return to starting position. Repeat _10__ times, 2 sets. Do with left arm. Band color _yellow/red_____   Shoulder Rotation: Double Arm   On back, knees bent, feet flat, elbows tucked at sides, bent 90, hands palms up. Pull hands apart and down toward floor, keeping elbows near sides. Hold momentarily. Slowly return to starting position. Repeat _10_ times, 2 sets. Band color _yellow/red _____

## 2017-07-17 NOTE — Therapy (Addendum)
[place], [place], [address] Phone: [phone]   Fax:  [phone]  Physical Therapy Treatment  Patient Details  Name: Kayla Gallagher MRN: [id number] Date of Birth: Jul 04, 1951 Referring Provider: Dr. Dianah Field  Encounter Date: 07/17/2017      PT End of Session - 07/17/17 0807    Visit Number 4   Number of Visits 12   Date for PT Re-Evaluation 08/13/17   Authorization Type BCBS/Medicare   PT Start Time 0803   PT Stop Time 0843   PT Time Calculation (min) 40 min   Activity Tolerance Patient tolerated treatment well;No increased pain   Behavior During Therapy [facility] for tasks assessed/performed      Past Medical History:  Diagnosis Date  . Depression   . Diabetes mellitus without complication ([place])   . Essential hypertension, benign 01/07/2013  . Headache(784.0)   . History of anemia   . History of breast cancer   . History of hepatitis C 01/07/2013  . Hyperlipidemia   . Hypertension    Does not see cardiology    Past Surgical History:  Procedure Laterality Date  . ABDOMINAL HYSTERECTOMY  1976  . ABDOMINAL HYSTERECTOMY    . ARTERY BIOPSY Right 06/30/2013   Procedure: BIOPSY TEMPORAL ARTERY;  Surgeon: Rosetta Posner, MD;  Location: [facility];  Service: Vascular;  Laterality: Right;  . BREAST REDUCTION SURGERY    . BUNIONECTOMY    . tummy tuck      There were no vitals filed for this visit.      Subjective Assessment - 07/17/17 0808    Subjective Pt reports she is back to normal life again, without pain.  She can hold her head up without pain.  Her left side gets stiff, but it always has been.  She has been doing the neck stretches daily and tries to get up from chair at work often.    Currently in Pain? No/denies   Pain Score 0-No pain            [facility] PT Assessment - 07/17/17 0001      Assessment   Medical Diagnosis neck pain   Referring Provider Dr. Dianah Field   Hand  Dominance Right   Next MD Visit PRN     AROM   Cervical - Right Side Bend 30   Cervical - Left Side Bend 45             [facility] Adult PT Treatment/Exercise - 07/17/17 0001      Neck Exercises: Machines for Strengthening   UBE (Upper Arm Bike) L1: 1.5 min each direction      Neck Exercises: Theraband   Shoulder Extension --   Shoulder External Rotation 10 reps  2 sets   Shoulder External Rotation Limitations yellow, red; supine   Horizontal ABduction 10 reps  2 sets   Horizontal ABduction Limitations yellow, red; supine   Other Theraband Exercises shoulder flexion bil with yellow/red x 10 reps, 2 sets; supine   Other Theraband Exercises sash x 10 reps, 2 sets each arm - yellow/red banc     Manual Therapy   Manual therapy comments pt supine   Soft tissue mobilization bil upper trap, levator and cervical paraspinals, Lt SCM   Myofascial Release suboccipital release     Neck Exercises: Stretches   Upper Trapezius Stretch 2 reps;30 seconds   Upper Trapezius Stretch Limitations bil   Levator Stretch 2 reps;30 seconds   Levator Stretch  Limitations bil   Other Neck Stretches low and midlevel doorway stretch x 30 sec, 2 reps                PT Education - 07/17/17 0936    Education provided Yes   Education Details HEP, issued yellow band   Person(s) Educated Patient   Methods Explanation;Handout   Comprehension Verbalized understanding;Returned demonstration             PT Long Term Goals - 07/17/17 0813      PT LONG TERM GOAL #1   Title independent with HEP   Time 6   Period Weeks   Status On-going     PT LONG TERM GOAL #2   Title report pain < 2/10 with activity for improved function   Time 6   Period Weeks   Status Achieved     PT LONG TERM GOAL #3   Title verbalize understanding of posture/body mechanics to decrease risk of reinjury    Time 6   Period Weeks   Status Achieved     PT LONG TERM GOAL #4   Title perform cervical sidebending bil  without increase in pain for improved motion and function   Time 6   Period Weeks   Status Partially Met               Plan - 07/17/17 7824    Clinical Impression Statement Pt reports she is back to baseline with activity. She can sidebeck head without pain, although still limited with Rt sidebending.  She verbalized during session realization of poor posture that leads to reinjury; plans to join gym to keep strengthening after d/c.    Rehab Potential Good   PT Frequency 2x / week   PT Duration 6 weeks   PT Treatment/Interventions ADLs/Self Care Home Management;Cryotherapy;Electrical Stimulation;Moist Heat;Ultrasound;Traction;Therapeutic exercise;Therapeutic activities;Functional mobility training;Patient/family education;Manual techniques;Passive range of motion;Taping;Dry needling   PT Next Visit Plan DN to cervical paraspinals/SCM.  Assess response to new HEP.    Consulted and Agree with Plan of Care Patient      Patient will benefit from skilled therapeutic intervention in order to improve the following deficits and impairments:  Pain, Decreased range of motion, Postural dysfunction, Increased fascial restricitons, Increased muscle spasms  Visit Diagnosis: Cervicalgia  Abnormal posture     Problem List Patient Active Problem List   Diagnosis Date Noted  . DDD (degenerative disc disease), cervical 06/22/2017  . Acute abdominal pain in left lower quadrant 06/22/2017  . Obesity 09/09/2015  . Anxiety state 05/04/2015  . Strain of elbow, left 06/01/2014  . Temporal arteritis ([place]) 06/18/2013  . Actinic keratosis 04/16/2013  . Type 2 diabetes mellitus ([place]) 02/21/2013  . Breast mass, right 02/21/2013  . Hyperplastic colon polyp 01/22/2013  . 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  . History of hepatitis C 01/07/2013  . Essential hypertension, benign 01/07/2013  . Hyperlipidemia 01/07/2013  . History of breast cancer 01/07/2013  . Depression 01/07/2013   Kerin Perna,  PTA 07/17/17 9:37 AM  [facility], [place], [address] Phone: [phone]   Fax:  [phone]  Name: Kayla Gallagher MRN: [id number] Date of Birth: 16-Nov-1950

## 2017-07-19 ENCOUNTER — Encounter: Payer: Self-pay | Admitting: Rehabilitative and Restorative Service Providers"

## 2017-07-19 ENCOUNTER — Ambulatory Visit (INDEPENDENT_AMBULATORY_CARE_PROVIDER_SITE_OTHER): Payer: BLUE CROSS/BLUE SHIELD | Admitting: Rehabilitative and Restorative Service Providers"

## 2017-07-19 DIAGNOSIS — M542 Cervicalgia: Secondary | ICD-10-CM | POA: Diagnosis not present

## 2017-07-19 DIAGNOSIS — R293 Abnormal posture: Secondary | ICD-10-CM | POA: Diagnosis not present

## 2017-07-19 NOTE — Therapy (Addendum)
[place], [place], [address] Phone: [phone]   Fax:  [phone]  Physical Therapy Treatment  Patient Details  Name: Kayla Gallagher MRN: [id number] Date of Birth: 04-28-1951 Referring Provider: Dr. Dianah Field  Encounter Date: 07/19/2017      PT End of Session - 07/19/17 0808    Visit Number 5   Number of Visits 12   Date for PT Re-Evaluation 08/13/17   PT Start Time 0805   PT Stop Time 6803   PT Time Calculation (min) 52 min   Activity Tolerance Patient tolerated treatment well;No increased pain      Past Medical History:  Diagnosis Date  . Depression   . Diabetes mellitus without complication ([place])   . Essential hypertension, benign 01/07/2013  . Headache(784.0)   . History of anemia   . History of breast cancer   . History of hepatitis C 01/07/2013  . Hyperlipidemia   . Hypertension    Does not see cardiology    Past Surgical History:  Procedure Laterality Date  . ABDOMINAL HYSTERECTOMY  1976  . ABDOMINAL HYSTERECTOMY    . ARTERY BIOPSY Right 06/30/2013   Procedure: BIOPSY TEMPORAL ARTERY;  Surgeon: Rosetta Posner, MD;  Location: [facility];  Service: Vascular;  Laterality: Right;  . BREAST REDUCTION SURGERY    . BUNIONECTOMY    . tummy tuck      There were no vitals filed for this visit.      Subjective Assessment - 07/19/17 0809    Subjective Patient reports that she has no pain this am. She feels she is making great progress toward full resolution of symptoms   Currently in Pain? No/denies                         [facility] Adult PT Treatment/Exercise - 07/19/17 0001      Neck Exercises: Machines for Strengthening   UBE (Upper Arm Bike) L1: 1.5 min each direction      Neck Exercises: Standing   Neck Retraction 5 reps;10 secs   Other Standing Exercises 3 way doorway stretch 30 sec x 2 each      Neck Exercises: Supine   Neck Retraction 10 reps;5 secs      Moist Heat Therapy   Number Minutes Moist Heat 15 Minutes   Moist Heat Location Cervical     Electrical Stimulation   Electrical Stimulation Location neck/upper trap  Lt   Electrical Stimulation Action IFC   Electrical Stimulation Parameters to tolerance   Electrical Stimulation Goals Tone;Pain     Manual Therapy   Manual therapy comments pt supine   Joint Mobilization Grades 2-3 lateral and UPA mobs C3-6   Soft tissue mobilization bil upper trap, levator and cervical paraspinals, Lt SCM   Myofascial Release suboccipital release     Neck Exercises: Stretches   Upper Trapezius Stretch 2 reps;30 seconds   Levator Stretch 2 reps;30 seconds          Trigger Point Dry Needling - 07/19/17 0844    Consent Given? Yes   Muscles Treated Upper Body --  scaleni all DN on Lt    Upper Trapezius Response Palpable increased muscle length;Twitch reponse elicited   Longissimus Response Palpable increased muscle length;Twitch response elicited              PT Education - 07/19/17 0842    Education provided Yes   Education Details HEP TENs  info   Person(s) Educated Patient   Methods Explanation;Demonstration;Tactile cues;Verbal cues;Handout   Comprehension Verbalized understanding;Returned demonstration;Verbal cues required;Tactile cues required             PT Long Term Goals - 07/17/17 0813      PT LONG TERM GOAL #1   Title independent with HEP   Time 6   Period Weeks   Status On-going     PT LONG TERM GOAL #2   Title report pain < 2/10 with activity for improved function   Time 6   Period Weeks   Status Achieved     PT LONG TERM GOAL #3   Title verbalize understanding of posture/body mechanics to decrease risk of reinjury    Time 6   Period Weeks   Status Achieved     PT LONG TERM GOAL #4   Title perform cervical sidebending bil without increase in pain for improved motion and function   Time 6   Period Weeks   Status Partially Met                Plan - 07/19/17 1213    Clinical Impression Statement Jackelyn Poling reports continued improvement. She is feeling better and almost symptom free. She demonstrates improved cervical mobilty with min to mod muscular tightness noted through the Lt > Rt cervical and upper trap musculature. She will continue with her independent HEP and contact us if she wants to schedule additional PT visits. Goals of therapy are partially accomplished. Patient will join the gym and continue with strengthening after d/c.    Rehab Potential Good   PT Frequency 2x / week   PT Duration 6 weeks   PT Treatment/Interventions ADLs/Self Care Home Management;Cryotherapy;Electrical Stimulation;Moist Heat;Ultrasound;Traction;Therapeutic exercise;Therapeutic activities;Functional mobility training;Patient/family education;Manual techniques;Passive range of motion;Taping;Dry needling   PT Next Visit Plan Patient will continue with independent HEP and call within the next 2 weeks to schedule additional visits.    Consulted and Agree with Plan of Care Patient      Patient will benefit from skilled therapeutic intervention in order to improve the following deficits and impairments:  Pain, Decreased range of motion, Postural dysfunction, Increased fascial restricitons, Increased muscle spasms  Visit Diagnosis: Cervicalgia  Abnormal posture     Problem List Patient Active Problem List   Diagnosis Date Noted  . DDD (degenerative disc disease), cervical 06/22/2017  . Acute abdominal pain in left lower quadrant 06/22/2017  . Obesity 09/09/2015  . Anxiety state 05/04/2015  . Strain of elbow, left 06/01/2014  . Temporal arteritis ([place]) 06/18/2013  . Actinic keratosis 04/16/2013  . Type 2 diabetes mellitus ([place]) 02/21/2013  . Breast mass, right 02/21/2013  . Hyperplastic colon polyp 01/22/2013  . 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  . History of hepatitis C 01/07/2013  . Essential hypertension, benign 01/07/2013  . Hyperlipidemia  01/07/2013  . History of breast cancer 01/07/2013  . Depression 01/07/2013    Teddie Mehta Nilda Simmer PT, MPH  07/19/2017, 12:19 PM  [facility] [place] [facility], [place], [address] Phone: [phone]   Fax:  [phone]  Name: SORAH FALKENSTEIN MRN: [id number] Date of Birth: 11-02-1951  PHYSICAL THERAPY DISCHARGE SUMMARY  Visits from Start of Care: 5  Current functional level related to goals / functional outcomes: See progress note for discharge status   Remaining deficits: Pain free at discharge. Returned to all normal functional activities.   Education / Equipment: HEP Plan: Patient agrees to discharge.  Patient goals were  partially met. Patient is being discharged due to being pleased with the current functional level.  ?????    Marigene Erler P. Helene Kelp PT, MPH 08/07/17 8:36 AM

## 2017-07-19 NOTE — Patient Instructions (Signed)
Scapula Adduction With Pectoralis Stretch: Low - Standing   Shoulders at 45 hands even with shoulders, keeping weight through legs, shift weight forward until you feel pull or stretch through the front of your chest. Hold _30__ seconds. Do _3__ times, _2-4__ times per day.   Scapula Adduction With Pectoralis Stretch: Mid-Range - Standing   Shoulders at 90 elbows even with shoulders, keeping weight through legs, shift weight forward until you feel pull or strength through the front of your chest. Hold __30_ seconds. Do _3__ times, __2-4_ times per day.   Scapula Adduction With Pectoralis Stretch: High - Standing   Shoulders at 120 hands up high on the doorway, keeping weight on feet, shift weight forward until you feel pull or stretch through the front of your chest. Hold _30__ seconds. Do _3__ times, _2-3__ times per day.  TENS UNIT  This is helpful for muscle pain and spasm.   Search and Purchase a TENS 7000 2nd edition at www.tenspros.com or www.amazon.com  (It should be less than $30)     TENS unit instructions:   Do not shower or bathe with the unit on  Turn the unit off before removing electrodes or batteries  If the electrodes lose stickiness add a drop of water to the electrodes after they are disconnected from the unit and place on plastic sheet. If you continued to have difficulty, call the TENS unit company to purchase more electrodes.  Do not apply lotion on the skin area prior to use. Make sure the skin is clean and dry as this will help prolong the life of the electrodes.  After use, always check skin for unusual red areas, rash or other skin difficulties. If there are any skin problems, does not apply electrodes to the same area.  Never remove the electrodes from the unit by pulling the wires.  Do not use the TENS unit or electrodes other than as directed.

## 2017-07-23 ENCOUNTER — Encounter: Payer: BLUE CROSS/BLUE SHIELD | Admitting: Rehabilitative and Restorative Service Providers"

## 2017-07-26 ENCOUNTER — Encounter: Payer: BLUE CROSS/BLUE SHIELD | Admitting: Physical Therapy

## 2017-07-28 ENCOUNTER — Other Ambulatory Visit: Payer: Self-pay | Admitting: Physician Assistant

## 2017-07-28 DIAGNOSIS — E119 Type 2 diabetes mellitus without complications: Secondary | ICD-10-CM

## 2017-08-29 ENCOUNTER — Other Ambulatory Visit: Payer: Self-pay | Admitting: Physician Assistant

## 2017-08-29 DIAGNOSIS — E119 Type 2 diabetes mellitus without complications: Secondary | ICD-10-CM

## 2017-09-01 ENCOUNTER — Other Ambulatory Visit: Payer: Self-pay | Admitting: Physician Assistant

## 2017-09-01 DIAGNOSIS — E119 Type 2 diabetes mellitus without complications: Secondary | ICD-10-CM

## 2017-09-05 ENCOUNTER — Other Ambulatory Visit: Payer: Self-pay | Admitting: Physician Assistant

## 2017-09-05 DIAGNOSIS — N951 Menopausal and female climacteric states: Secondary | ICD-10-CM

## 2017-09-09 ENCOUNTER — Other Ambulatory Visit: Payer: Self-pay | Admitting: Physician Assistant

## 2017-09-09 DIAGNOSIS — N951 Menopausal and female climacteric states: Secondary | ICD-10-CM

## 2017-09-22 LAB — HM DIABETES EYE EXAM

## 2017-10-13 ENCOUNTER — Other Ambulatory Visit: Payer: Self-pay | Admitting: Physician Assistant

## 2017-10-13 DIAGNOSIS — E119 Type 2 diabetes mellitus without complications: Secondary | ICD-10-CM

## 2017-10-26 ENCOUNTER — Encounter: Payer: Self-pay | Admitting: Physician Assistant

## 2017-11-01 ENCOUNTER — Other Ambulatory Visit: Payer: Self-pay | Admitting: Physician Assistant

## 2017-11-01 DIAGNOSIS — E119 Type 2 diabetes mellitus without complications: Secondary | ICD-10-CM

## 2017-11-02 ENCOUNTER — Ambulatory Visit (INDEPENDENT_AMBULATORY_CARE_PROVIDER_SITE_OTHER): Payer: BLUE CROSS/BLUE SHIELD | Admitting: Physician Assistant

## 2017-11-02 ENCOUNTER — Encounter: Payer: Self-pay | Admitting: Physician Assistant

## 2017-11-02 VITALS — BP 141/67 | HR 79 | Ht 69.0 in | Wt 209.0 lb

## 2017-11-02 DIAGNOSIS — E119 Type 2 diabetes mellitus without complications: Secondary | ICD-10-CM

## 2017-11-02 DIAGNOSIS — F3289 Other specified depressive episodes: Secondary | ICD-10-CM

## 2017-11-02 DIAGNOSIS — E782 Mixed hyperlipidemia: Secondary | ICD-10-CM | POA: Diagnosis not present

## 2017-11-02 DIAGNOSIS — I10 Essential (primary) hypertension: Secondary | ICD-10-CM

## 2017-11-02 LAB — POCT GLYCOSYLATED HEMOGLOBIN (HGB A1C): HEMOGLOBIN A1C: 6.7

## 2017-11-02 MED ORDER — PAROXETINE HCL 40 MG PO TABS: 40.0000 mg | ORAL_TABLET | Freq: Every morning | ORAL | 1 refills | Status: DC

## 2017-11-02 MED ORDER — AMLODIPINE BESYLATE 10 MG PO TABS: ORAL_TABLET | ORAL | 1 refills | Status: DC

## 2017-11-02 MED ORDER — PIOGLITAZONE HCL 45 MG PO TABS: 45.0000 mg | ORAL_TABLET | Freq: Every day | ORAL | 0 refills | Status: DC

## 2017-11-02 MED ORDER — METFORMIN HCL 1000 MG PO TABS: 1000.0000 mg | ORAL_TABLET | Freq: Two times a day (BID) | ORAL | 0 refills | Status: DC

## 2017-11-02 MED ORDER — ATORVASTATIN CALCIUM 10 MG PO TABS: 10.0000 mg | ORAL_TABLET | Freq: Every day | ORAL | 1 refills | Status: DC

## 2017-11-02 MED ORDER — LISINOPRIL 40 MG PO TABS: 40.0000 mg | ORAL_TABLET | Freq: Every day | ORAL | 1 refills | Status: DC

## 2017-11-02 MED ORDER — HYDROCHLOROTHIAZIDE 25 MG PO TABS: ORAL_TABLET | ORAL | 1 refills | Status: DC

## 2017-11-02 NOTE — Progress Notes (Signed)
Subjective:    Patient ID: Kayla Gallagher, female    DOB: 1951-06-17, 66 y.o.   MRN: [id number]  HPI Pt is a 66 yo obese female with HTN, DM who presents to the clinic for medication follow up.   DM- doing well. Taking actos and metformin. Not checking sugars regularly. Not exercising. She is trying to keep a low sugar/carb diet. No open sores or wounds. No hypoglycemia.   HTN- pt is taking amlodipine, lisinopril, HCTZ. She denies any CP, palpitations, headaches, or vision changes.   Depression- doing well. No problems or concerns. No SI/HC. Well controlled on paxil.   Hyperlipidemia- taking lipitor daily. Not checked lipids in a while.    .. Active Ambulatory Problems    Diagnosis Date Noted  . History of hepatitis C 01/07/2013  . Essential hypertension, benign 01/07/2013  . Hyperlipidemia 01/07/2013  . History of breast cancer 01/07/2013  . Depression 01/07/2013  . 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  . Hyperplastic colon polyp 01/22/2013  . Type 2 diabetes mellitus ([place]) 02/21/2013  . Breast mass, right 02/21/2013  . Actinic keratosis 04/16/2013  . Temporal arteritis ([place]) 06/18/2013  . Strain of elbow, left 06/01/2014  . Anxiety state 05/04/2015  . Obesity 09/09/2015  . DDD (degenerative disc disease), cervical 06/22/2017  . Acute abdominal pain in left lower quadrant 06/22/2017   Resolved Ambulatory Problems    Diagnosis Date Noted  . No Resolved Ambulatory Problems   Past Medical History:  Diagnosis Date  . Depression   . Diabetes mellitus without complication ([place])   . Essential hypertension, benign 01/07/2013  . Headache(784.0)   . History of anemia   . History of breast cancer   . History of hepatitis C 01/07/2013  . Hyperlipidemia   . Hypertension       Review of Systems  All other systems reviewed and are negative.      Objective:   Physical Exam  Constitutional: She is oriented to person, place, and time. She appears well-developed and well-nourished.   HENT:  Head: Normocephalic and atraumatic.  Neck: Normal range of motion. Neck supple.  Cardiovascular: Normal rate, regular rhythm and normal heart sounds.  Pulmonary/Chest: Effort normal and breath sounds normal.  Lymphadenopathy:    She has no cervical adenopathy.  Neurological: She is alert and oriented to person, place, and time.  Psychiatric: She has a normal mood and affect. Her behavior is normal.          Assessment & Plan:   [name] KitchenNeta was seen today for diabetes.  Diagnoses and all orders for this visit:  Type 2 diabetes mellitus without complication, without long-term current use of insulin (HCC) -     POCT HgB A1C -     metFORMIN (GLUCOPHAGE) 1000 MG tablet; Take 1 tablet (1,000 mg total) by mouth 2 (two) times daily with a meal. -     pioglitazone (ACTOS) 45 MG tablet; Take 1 tablet (45 mg total) by mouth daily. -     COMPLETE METABOLIC PANEL WITH GFR  Essential hypertension, benign -     lisinopril (PRINIVIL,ZESTRIL) 40 MG tablet; Take 1 tablet (40 mg total) by mouth daily. -     hydrochlorothiazide (HYDRODIURIL) 25 MG tablet; ONE TABLET BY MOUTH EVERY MORNING FOR BLOOD PRESSURE CONTROL. -     amLODipine (NORVASC) 10 MG tablet; ONE TABLET BY MOUTH EVERY DAY FOR BLOOD PRESSURE CONTROL -     COMPLETE METABOLIC PANEL WITH GFR  Other depression -  PARoxetine (PAXIL) 40 MG tablet; Take 1 tablet (40 mg total) by mouth every morning.  Mixed hyperlipidemia -     atorvastatin (LIPITOR) 10 MG tablet; Take 1 tablet (10 mg total) by mouth daily. -     Lipid Panel w/reflex Direct LDL    .Marland Kitchen Lab Results  Component Value Date   HGBA1C 6.7 11/02/2017  .Marland Kitchen Depression screen [facility] 2/9 11/02/2017 06/22/2017 04/28/2017  Decreased Interest 0 0 0  Down, Depressed, Hopeless 0 0 0  PHQ - 2 Score 0 0 0    DM- a1c controlled. Continue metformin/actos. Continue making diet changes and staying active.  On STATIN.  On ASA.  On ACE. BP not to goal today.  Vaccines up to date.   Follow up in 3 months.   HTN- not quite to goal. Pt reports BP under 140/90 at home. Refilled medications. Work on low salt diet and weight loss.   Hyperlipidemia- recheck lipids and will adjust medications as needed.   Depression- refilled paxil.

## 2017-11-03 ENCOUNTER — Encounter: Payer: Self-pay | Admitting: Physician Assistant

## 2017-11-23 ENCOUNTER — Other Ambulatory Visit: Payer: Self-pay

## 2017-11-23 ENCOUNTER — Emergency Department (INDEPENDENT_AMBULATORY_CARE_PROVIDER_SITE_OTHER)
Admission: EM | Admit: 2017-11-23 | Discharge: 2017-11-23 | Disposition: A | Discharge status: Home / Self Care | Payer: BLUE CROSS/BLUE SHIELD | Source: Home / Self Care | Attending: Family Medicine | Admitting: Family Medicine

## 2017-11-23 ENCOUNTER — Encounter: Payer: Self-pay | Admitting: *Deleted

## 2017-11-23 DIAGNOSIS — S161XXA Strain of muscle, fascia and tendon at neck level, initial encounter: Secondary | ICD-10-CM | POA: Diagnosis not present

## 2017-11-23 DIAGNOSIS — M62838 Other muscle spasm: Secondary | ICD-10-CM

## 2017-11-23 DIAGNOSIS — J069 Acute upper respiratory infection, unspecified: Secondary | ICD-10-CM | POA: Diagnosis not present

## 2017-11-23 MED ORDER — CYCLOBENZAPRINE HCL 10 MG PO TABS: 10.0000 mg | ORAL_TABLET | Freq: Two times a day (BID) | ORAL | 0 refills | Status: DC | PRN

## 2017-11-23 MED ORDER — PREDNISONE 20 MG PO TABS: ORAL_TABLET | ORAL | 0 refills | Status: DC

## 2017-11-23 NOTE — ED Provider Notes (Signed)
[facility] Gall[facility]    CSN: [id number] Arrival date & time: 11/23/17  1121     History   Chief Complaint Chief Complaint  Patient presents with  . Neck Pain    HPI Kayla Gallagher is a 67 y.o. female.   HPI  Kayla Gallagher is a 67 y.o. female presenting to UC with c/o gradually worsening Left side neck pain that started 1 week ago.  Pain is worse when lying down.  She reports intermittent episodes of neck pain but it has worsened since developing URI with cough and sneezing.  She has tried Ibuprofen with minimal relief. She has had prednisone and muscle relaxers in the past and wonders if either would help today.  Denies known injury.   Past Medical History:  Diagnosis Date  . Depression   . Diabetes mellitus without complication ([place])   . Essential hypertension, benign 01/07/2013  . Headache(784.0)   . History of anemia   . History of breast cancer   . History of hepatitis C 01/07/2013  . Hyperlipidemia   . Hypertension    Does not see cardiology    Patient Active Problem List   Diagnosis Date Noted  . DDD (degenerative disc disease), cervical 06/22/2017  . Acute abdominal pain in left lower quadrant 06/22/2017  . Obesity 09/09/2015  . Anxiety state 05/04/2015  . Strain of elbow, left 06/01/2014  . Temporal arteritis ([place]) 06/18/2013  . Actinic keratosis 04/16/2013  . Type 2 diabetes mellitus ([place]) 02/21/2013  . Breast mass, right 02/21/2013  . Hyperplastic colon polyp 01/22/2013  . 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  . History of hepatitis C 01/07/2013  . Essential hypertension, benign 01/07/2013  . Hyperlipidemia 01/07/2013  . History of breast cancer 01/07/2013  . Depression 01/07/2013    Past Surgical History:  Procedure Laterality Date  . ABDOMINAL HYSTERECTOMY  1976  . ABDOMINAL HYSTERECTOMY    . ARTERY BIOPSY Right 06/30/2013   Procedure: BIOPSY TEMPORAL ARTERY;  Surgeon: Rosetta Posner, MD;  Location: [facility];  Service: Vascular;  Laterality: Right;  .  BREAST REDUCTION SURGERY    . BUNIONECTOMY    . tummy tuck      OB History    No data available       Home Medications    Prior to Admission medications   Medication Sig Start Date End Date Taking? Authorizing Provider  ALPRAZolam Duanne Moron) 0.5 MG tablet Take 1 tablet (0.5 mg total) by mouth 2 (two) times daily as needed for anxiety. 02/02/17   Breeback, Jade L, PA-C  amLODipine (NORVASC) 10 MG tablet ONE TABLET BY MOUTH EVERY DAY FOR BLOOD PRESSURE CONTROL 11/02/17   Donella Stade, PA-C  aspirin EC 81 MG tablet Take 81 mg by mouth daily.    [provider]  atorvastatin (LIPITOR) 10 MG tablet Take 1 tablet (10 mg total) by mouth daily. 11/02/17   Donella Stade, PA-C  chlorhexidine (HIBICLENS) 4 % external liquid Use as body wash every 2-3 days to reduce MRSA outbreaks 05/27/17   Noe Gens, PA-C  cyclobenzaprine (FLEXERIL) 10 MG tablet Take 1 tablet (10 mg total) by mouth 2 (two) times daily as needed. 11/23/17   Noe Gens, PA-C  estrogens-methylTEST (ESTRATEST) 1.25-2.5 MG tablet Take 1 tablet by mouth daily. Patient needs to schedule a follow up appointment with PCP before more refills. 09/12/17   Breeback, Jade L, PA-C  hydrochlorothiazide (HYDRODIURIL) 25 MG tablet ONE TABLET BY MOUTH EVERY MORNING FOR BLOOD PRESSURE  CONTROL. 11/02/17   Breeback, Jade L, PA-C  lisinopril (PRINIVIL,ZESTRIL) 40 MG tablet Take 1 tablet (40 mg total) by mouth daily. 11/02/17   Donella Stade, PA-C  metFORMIN (GLUCOPHAGE) 1000 MG tablet Take 1 tablet (1,000 mg total) by mouth 2 (two) times daily with a meal. 11/02/17   Breeback, Jade L, PA-C  Multiple Vitamins-Minerals (MULTIVITAMIN PO) Take 1 tablet by mouth daily.    [provider]  PARoxetine (PAXIL) 40 MG tablet Take 1 tablet (40 mg total) by mouth every morning. 11/02/17   Breeback, Jade L, PA-C  pioglitazone (ACTOS) 45 MG tablet Take 1 tablet (45 mg total) by mouth daily. 11/02/17   Donella Stade, PA-C  predniSONE  (DELTASONE) 20 MG tablet 3 tabs po day one, then 2 po daily x 4 days 11/23/17   Noe Gens, PA-C    Family History Family History  Problem Relation Age of Onset  . Heart attack Father   . Diabetes Father        grandmother  . Cancer Father   . Heart disease Father   . Hyperlipidemia Father   . Hypertension Father   . Cancer Mother   . Alcoholism Unknown        parents  . Prostate cancer Unknown        grandfather  . Hyperlipidemia Unknown        grandfather  . Breast cancer Sister     Social History Social History   Tobacco Use  . Smoking status: Never Smoker  . Smokeless tobacco: Never Used  Substance Use Topics  . Alcohol use: No  . Drug use: No     Allergies   Morphine and related   Review of Systems Review of Systems  Constitutional: Negative for chills and fever.  HENT: Positive for congestion and sneezing. Negative for ear pain, sore throat, trouble swallowing and voice change.   Respiratory: Positive for cough. Negative for shortness of breath.   Cardiovascular: Negative for chest pain and palpitations.  Gastrointestinal: Negative for abdominal pain, diarrhea, nausea and vomiting.  Musculoskeletal: Positive for myalgias and neck pain (Left side). Negative for arthralgias, back pain and neck stiffness.  Skin: Negative for rash.  Neurological: Negative for weakness and numbness.     Physical Exam Triage Vital Signs ED Triage Vitals [11/23/17 1147]  Enc Vitals Group     BP 119/75     Pulse Rate 98     Resp 16     Temp 98.1 F (36.7 C)     Temp Source Oral     SpO2 95 %     Weight 210 lb (95.3 kg)     Height 5' 9" (1.753 m)     Head Circumference      Peak Flow      Pain Score 0     Pain Loc      Pain Edu?      Excl. in [place]?    No data found.  Updated Vital Signs BP 119/75 (BP Location: Right Arm)   Pulse 98   Temp 98.1 F (36.7 C) (Oral)   Resp 16   Ht 5' 9" (1.753 m)   Wt 210 lb (95.3 kg)   SpO2 95%   BMI 31.01 kg/m   Visual  Acuity Right Eye Distance:   Left Eye Distance:   Bilateral Distance:    Right Eye Near:   Left Eye Near:    Bilateral Near:     Physical Exam  Constitutional: She is oriented to person, place, and time. She appears well-developed and well-nourished. No distress.  HENT:  Head: Normocephalic and atraumatic.  Right Ear: Tympanic membrane normal.  Left Ear: Tympanic membrane normal.  Nose: Mucosal edema present. Right sinus exhibits no maxillary sinus tenderness and no frontal sinus tenderness. Left sinus exhibits no maxillary sinus tenderness and no frontal sinus tenderness.  Mouth/Throat: Uvula is midline, oropharynx is clear and moist and mucous membranes are normal.  Eyes: EOM are normal.  Neck: Normal range of motion. Neck supple.    No midline bone tenderness, no crepitus or step-offs. Tenderness to Left side cervical muscles. Full ROM. No meningeal signs.  Cardiovascular: Normal rate and regular rhythm.  Pulmonary/Chest: Effort normal and breath sounds normal. No stridor. No respiratory distress. She has no wheezes. She has no rales.  Musculoskeletal: Normal range of motion.  Neurological: She is alert and oriented to person, place, and time.  Skin: Skin is warm and dry. She is not diaphoretic.  Psychiatric: She has a normal mood and affect. Her behavior is normal.  Nursing note and vitals reviewed.    UC Treatments / Results  Labs (all labs ordered are listed, but only abnormal results are displayed) Labs Reviewed - No data to display  EKG  EKG Interpretation None       Radiology No results found.  Procedures Procedures (including critical care time)  Medications Ordered in UC Medications - No data to display   Initial Impression / Assessment and Plan / UC Course  I have reviewed the triage vital signs and the nursing notes.  Pertinent labs & imaging results that were available during my care of the patient were reviewed by me and considered in my medical  decision making (see chart for details).     Hx and exam c/w Left side neck muscle strain URI symptoms c/w viral illness Encouraged symptomatic treatment F/u with PCP or sports medicine in 1 week if not improving  Final Clinical Impressions(s) / UC Diagnoses   Final diagnoses:  Strain of neck muscle, initial encounter  Muscle spasms of neck  Viral upper respiratory illness    ED Discharge Orders        Ordered    predniSONE (DELTASONE) 20 MG tablet     11/23/17 1241    cyclobenzaprine (FLEXERIL) 10 MG tablet  2 times daily PRN     11/23/17 1241       Controlled Substance Prescriptions [place] Controlled Substance Registry consulted? Not Applicable   Tyrell Antonio 11/23/17 1418

## 2017-11-23 NOTE — ED Triage Notes (Addendum)
Pt c/o neck pain x 1 wk only while laying down, worse after developing a URI 2 days ago. Last dose IBF 0830 today with minimal relief. She applied ice last night.

## 2017-11-23 NOTE — Discharge Instructions (Signed)
°  Flexeril (cyclobenzaprine) is a muscle relaxer and may cause drowsiness. Do not drink alcohol, drive, or operate heavy machinery while taking. ° °

## 2017-12-06 ENCOUNTER — Ambulatory Visit (INDEPENDENT_AMBULATORY_CARE_PROVIDER_SITE_OTHER): Payer: BLUE CROSS/BLUE SHIELD | Admitting: Physician Assistant

## 2017-12-06 ENCOUNTER — Ambulatory Visit (INDEPENDENT_AMBULATORY_CARE_PROVIDER_SITE_OTHER): Payer: BLUE CROSS/BLUE SHIELD

## 2017-12-06 ENCOUNTER — Encounter: Payer: Self-pay | Admitting: Physician Assistant

## 2017-12-06 VITALS — BP 104/65 | HR 95 | Temp 98.1°F | Wt 208.0 lb

## 2017-12-06 DIAGNOSIS — J189 Pneumonia, unspecified organism: Secondary | ICD-10-CM

## 2017-12-06 DIAGNOSIS — J22 Unspecified acute lower respiratory infection: Secondary | ICD-10-CM | POA: Diagnosis not present

## 2017-12-06 DIAGNOSIS — R079 Chest pain, unspecified: Secondary | ICD-10-CM

## 2017-12-06 DIAGNOSIS — J181 Lobar pneumonia, unspecified organism: Secondary | ICD-10-CM

## 2017-12-06 DIAGNOSIS — R05 Cough: Secondary | ICD-10-CM

## 2017-12-06 DIAGNOSIS — R0781 Pleurodynia: Secondary | ICD-10-CM

## 2017-12-06 DIAGNOSIS — Z2821 Immunization not carried out because of patient refusal: Secondary | ICD-10-CM

## 2017-12-06 MED ORDER — AZITHROMYCIN 250 MG PO TABS: ORAL_TABLET | ORAL | 0 refills | Status: DC

## 2017-12-06 NOTE — Progress Notes (Signed)
HPI:                                                                Kayla MeuseDeborah A Gallagher is a 67 y.o. female [facility] today for sinus congestion and cough  Sinus Problem  This is a new problem. Episode onset: x 2 weeks. The problem is unchanged. There has been no fever. Associated symptoms include chills, congestion, coughing, neck pain, shortness of breath and sinus pressure. (+ pleuritic chest pain) Treatments tried: Prednisone, Mucinex, Claritin. The treatment provided mild relief.  Cough  This is a new problem. The current episode started 1 to 4 weeks ago. The problem has been unchanged. The problem occurs hourly. The cough is productive of sputum. Associated symptoms include chest pain (pleuritic), chills, nasal congestion, shortness of breath and weight loss. Pertinent negatives include no fever. Nothing aggravates the symptoms. She has tried oral steroids (Mucinex) for the symptoms. The treatment provided no relief. There is no history of asthma, COPD or pneumonia.       [facility] 11/02/2017 06/22/2017 04/28/2017  Decreased Interest 0 0 0  Down, Depressed, Hopeless 0 0 0  PHQ - 2 Score 0 0 0    No flowsheet data found.    Past Medical History:  Diagnosis Date  . Depression   . Diabetes mellitus without complication (HCC)   . Essential hypertension, benign 01/07/2013  . Headache(784.0)   . History of anemia   . History of breast cancer   . History of hepatitis C 01/07/2013  . Hyperlipidemia   . Hypertension    Does not see cardiology   Past Surgical History:  Procedure Laterality Date  . ABDOMINAL HYSTERECTOMY  1976  . ABDOMINAL HYSTERECTOMY    . ARTERY BIOPSY Right 06/30/2013   Procedure: BIOPSY TEMPORAL ARTERY;  Surgeon: Larina Earthlyodd F Early, MD;  [facility];  Service: Vascular;  Laterality: Right;  . BREAST REDUCTION SURGERY    . BUNIONECTOMY    . tummy tuck     Social History   Tobacco  Use  . Smoking status: Never Smoker  . Smokeless tobacco: Never Used  Substance Use Topics  . Alcohol use: No   family history includes Alcoholism in her unknown relative; Breast cancer in her sister; Cancer in her father and mother; Diabetes in her father; Heart attack in her father; Heart disease in her father; Hyperlipidemia in her father and unknown relative; Hypertension in her father; Prostate cancer in her unknown relative.    ROS: negative except as noted in the HPI  Medications: Current Outpatient Medications  Medication Sig Dispense Refill  . ALPRAZolam (XANAX) 0.5 MG tablet Take 1 tablet (0.5 mg total) by mouth 2 (two) times daily as needed for anxiety. 20 tablet 1  . amLODipine (NORVASC) 10 MG tablet ONE TABLET BY MOUTH EVERY DAY FOR BLOOD PRESSURE CONTROL 90 tablet 1  . aspirin EC 81 MG tablet Take 81 mg by mouth daily.    Marland Kitchen. atorvastatin (LIPITOR) 10 MG tablet Take 1 tablet (10 mg total) by mouth daily. 90 tablet 1  . chlorhexidine (HIBICLENS) 4 % external liquid Use as body wash every 2-3 days to reduce MRSA outbreaks 236 mL 0  . cyclobenzaprine (  FLEXERIL) 10 MG tablet Take 1 tablet (10 mg total) by mouth 2 (two) times daily as needed. 20 tablet 0  . estrogens-methylTEST (ESTRATEST) 1.25-2.5 MG tablet Take 1 tablet by mouth daily. Patient needs to schedule a follow up appointment with PCP before more refills. 30 tablet 0  . hydrochlorothiazide (HYDRODIURIL) 25 MG tablet ONE TABLET BY MOUTH EVERY MORNING FOR BLOOD PRESSURE CONTROL. 90 tablet 1  . lisinopril (PRINIVIL,ZESTRIL) 40 MG tablet Take 1 tablet (40 mg total) by mouth daily. 90 tablet 1  . metFORMIN (GLUCOPHAGE) 1000 MG tablet Take 1 tablet (1,000 mg total) by mouth 2 (two) times daily with a meal. 180 tablet 0  . Multiple Vitamins-Minerals (MULTIVITAMIN PO) Take 1 tablet by mouth daily.    Marland Kitchen PARoxetine (PAXIL) 40 MG tablet Take 1 tablet (40 mg total) by mouth every morning. 90 tablet 1  . pioglitazone (ACTOS) 45 MG  tablet Take 1 tablet (45 mg total) by mouth daily. 90 tablet 0   No current facility-administered medications for this visit.    Allergies  Allergen Reactions  . Morphine And Related Nausea And Vomiting       Objective:  BP 104/65   Pulse 95   Temp 98.1 F (36.7 C) (Oral)   Wt 208 lb (94.3 kg)   SpO2 93%   BMI 30.72 kg/m  Gen:  alert, not ill-appearing, no distress, appropriate for age, obese female HEENT: head normocephalic without obvious abnormality, conjunctiva and cornea clear, TM's clear bilaterally, no sinus tenderness, oropharynx clear, neck supple, no adenopathy, trachea midline Pulm: Normal work of breathing, normal phonation, breath sounds are mildly diminished on expiration in the right fields CV: Normal rate, regular rhythm, s1 and s2 distinct, no murmurs, clicks or rubs  Neuro: alert and oriented x 3, no tremor MSK: extremities atraumatic, normal gait and station Skin: intact, no rashes on exposed skin, no jaundice, no cyanosis Psych: well-groomed, cooperative, good eye contact, euthymic mood, affect mood-congruent, speech is articulate, and thought processes clear and goal-directed    No results found for this or any previous visit (from the past 72 hour(s)). No results found.    Assessment and Plan: 67 y.o. female with   1. Acute lower respiratory infection - SO2 93% on RA at rest, relative hypoxia. Pleuritic chest pain in the setting of 2 weeks of cough/cold symptoms concerning for pneumonia. Patient has never received pneumococcal vaccinations (refuses). CXR ordered to assess for infiltrate. - azithromycin (ZITHROMAX Z-PAK) 250 MG tablet; Take 2 tablets (500 mg) on  Day 1,  followed by 1 tablet (250 mg) once daily on Days 2 through 5.  Dispense: 6 tablet; Refill: 0 - DG Chest 2 View  2. Pleuritic chest pain - DG Chest 2 View  3. Refused pneumococcal vaccination   4. Refused influenza vaccine   5. Community acquired pneumonia of right middle lobe  of lung (HCC) - personally reviewed CXR, which shows patchy infiltrate in RML   Patient education and anticipatory guidance given Patient agrees with treatment plan Follow-up as needed if symptoms worsen or fail to improve  Levonne Hubert PA-C

## 2017-12-06 NOTE — Progress Notes (Signed)
Gavin PoundDeborah,  There is an area in your right middle lung that is suspicious for pneumonia. You received the treatment for this today. Be sure to complete the antibiotic. Follow-up with Lesly RubensteinJade if you do not feel improved.  Best, Vinetta Bergamoharley

## 2017-12-10 ENCOUNTER — Ambulatory Visit (INDEPENDENT_AMBULATORY_CARE_PROVIDER_SITE_OTHER): Payer: BLUE CROSS/BLUE SHIELD | Admitting: Physician Assistant

## 2017-12-10 ENCOUNTER — Encounter: Payer: Self-pay | Admitting: Physician Assistant

## 2017-12-10 VITALS — BP 110/61 | HR 100 | Temp 98.1°F | Resp 18 | Wt 206.0 lb

## 2017-12-10 DIAGNOSIS — J181 Lobar pneumonia, unspecified organism: Secondary | ICD-10-CM | POA: Diagnosis not present

## 2017-12-10 DIAGNOSIS — R05 Cough: Secondary | ICD-10-CM | POA: Diagnosis not present

## 2017-12-10 DIAGNOSIS — M940 Chondrocostal junction syndrome [Tietze]: Secondary | ICD-10-CM | POA: Diagnosis not present

## 2017-12-10 DIAGNOSIS — J189 Pneumonia, unspecified organism: Secondary | ICD-10-CM

## 2017-12-10 DIAGNOSIS — R059 Cough, unspecified: Secondary | ICD-10-CM

## 2017-12-10 MED ORDER — HYDROCODONE-HOMATROPINE 5-1.5 MG/5ML PO SYRP: 5.0000 mL | ORAL_SOLUTION | Freq: Two times a day (BID) | ORAL | 0 refills | Status: DC | PRN

## 2017-12-10 MED ORDER — PREDNISONE 50 MG PO TABS: ORAL_TABLET | ORAL | 0 refills | Status: DC

## 2017-12-10 NOTE — Patient Instructions (Signed)
Costochondritis Costochondritis is swelling and irritation (inflammation) of the tissue (cartilage) that connects your ribs to your breastbone (sternum). This causes pain in the front of your chest. Usually, the pain:  Starts gradually.  Is in more than one rib.  This condition usually goes away on its own over time. Follow these instructions at home:  Do not do anything that makes your pain worse.  If directed, put ice on the painful area: ? Put ice in a plastic bag. ? Place a towel between your skin and the bag. ? Leave the ice on for 20 minutes, 2-3 times a day.  If directed, put heat on the affected area as often as told by your doctor. Use the heat source that your doctor tells you to use, such as a moist heat pack or a heating pad. ? Place a towel between your skin and the heat source. ? Leave the heat on for 20-30 minutes. ? Take off the heat if your skin turns bright red. This is very important if you cannot feel pain, heat, or cold. You may have a greater risk of getting burned.  Take over-the-counter and prescription medicines only as told by your doctor.  Return to your normal activities as told by your doctor. Ask your doctor what activities are safe for you.  Keep all follow-up visits as told by your doctor. This is important. Contact a doctor if:  You have chills or a fever.  Your pain does not go away or it gets worse.  You have a cough that does not go away. Get help right away if:  You are short of breath. This information is not intended to replace advice given to you by your health care provider. Make sure you discuss any questions you have with your health care provider. Document Released: 04/17/2008 Document Revised: 05/19/2016 Document Reviewed: 02/23/2016 Elsevier Interactive Patient Education  2018 [employer]  

## 2017-12-10 NOTE — Progress Notes (Addendum)
Subjective:    Patient ID: Nilda Calamity, female    DOB: [birth year], 67 y.o.   MRN: [id number]  HPI  Pt is a 67 year old female with a history right middle lobe pneumonia diagnosed on 12/06/17  presenting to the clinic with chest pain on deep inspiration. She was seen by Nelson Chimes PA-C on 12/06/17 with sinus congestion and cough for two weeks. She was given a Z-pak which was completed today 12/10/17. She reports that this morning she woke up with sharp pain in her left side that radiates to her left back with deep inspiration. She reports this pain as an 8/10. She has been coughing frequently for the past 3 weeks since her symptoms started. She feels better after her antibiotic but still reports some chills, diaphoresis, and fatigue. She has had little appetite but has tried to hydrate and eat. Additionally, since Friday she has had some lightheadedness but thinks it could be due to her other symptoms. She has been taking Excedrin for her neck pain which helps relieve some of her neck pain.  .. Active Ambulatory Problems    Diagnosis Date Noted  . History of hepatitis C 01/07/2013  . Essential hypertension, benign 01/07/2013  . Hyperlipidemia 01/07/2013  . History of breast cancer 01/07/2013  . Depression 01/07/2013  . 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  . Hyperplastic colon polyp 01/22/2013  . Type 2 diabetes mellitus ([place]) 02/21/2013  . Breast mass, right 02/21/2013  . Actinic keratosis 04/16/2013  . Temporal arteritis ([place]) 06/18/2013  . Strain of elbow, left 06/01/2014  . Anxiety state 05/04/2015  . Obesity 09/09/2015  . DDD (degenerative disc disease), cervical 06/22/2017  . Acute abdominal pain in left lower quadrant 06/22/2017  . Community acquired pneumonia of right middle lobe of lung ([place]) 12/06/2017   Resolved Ambulatory Problems    Diagnosis Date Noted  . No Resolved Ambulatory Problems   Past Medical History:  Diagnosis Date  . Depression   . Diabetes mellitus without  complication ([place])   . Essential hypertension, benign 01/07/2013  . Headache(784.0)   . History of anemia   . History of breast cancer   . History of hepatitis C 01/07/2013  . Hyperlipidemia   . Hypertension      Review of Systems  Constitutional: Positive for chills, diaphoresis and fatigue. Negative for appetite change and fever.  HENT: Positive for congestion, postnasal drip and rhinorrhea. Negative for sinus pressure, sinus pain and sore throat.   Respiratory: Positive for cough and shortness of breath. Negative for wheezing.   Cardiovascular: Negative for chest pain and palpitations.  Neurological: Positive for dizziness and light-headedness. Negative for headaches.       Objective:   Physical Exam  Constitutional: She is oriented to person, place, and time. She appears well-developed and well-nourished. No distress.  HENT:  Head: Normocephalic and atraumatic.  Right Ear: External ear normal.  Left Ear: External ear normal.  Mouth/Throat: Oropharynx is clear and moist.  Cardiovascular: Normal rate, regular rhythm and normal heart sounds. Exam reveals no gallop and no friction rub.  No murmur heard. Pulmonary/Chest: Effort normal and breath sounds normal. She exhibits tenderness.  Pleuritic chest pain that radiates from the left side to the left back. Tenderness to palpation of the left side adjacent to the left breast.   Neurological: She is alert and oriented to person, place, and time.  Psychiatric: She has a normal mood and affect. Her behavior is normal.   Vitals:   12/10/17 1005  BP: 110/61  Pulse: 100  Resp: 18  Temp: 98.1 F (36.7 C)  SpO2: 95%       Assessment & Plan:  Ciela was seen today for follow-up.  Diagnoses and all orders for this visit:  Community acquired pneumonia of right middle lobe of lung ([place]) -     HYDROcodone-homatropine (HYCODAN) 5-1.5 MG/5ML syrup; Take 5 mLs by mouth every 12 (twelve) hours as needed for cough. -     DG Chest 2  View  Costochondritis -     predniSONE (DELTASONE) 50 MG tablet; One tab PO daily for 5 days. -     HYDROcodone-homatropine (HYCODAN) 5-1.5 MG/5ML syrup; Take 5 mLs by mouth every 12 (twelve) hours as needed for cough. -     DG Chest 2 View  Cough -     HYDROcodone-homatropine (HYCODAN) 5-1.5 MG/5ML syrup; Take 5 mLs by mouth every 12 (twelve) hours as needed for cough.   Marybel was given an Customer service manager on costochondritis and given prednisone to help with pain and inflammation. She also has been prescribed Hycodan for cough relief at night. She was provided with lab form for a repeat chest x-ray if she is not better or gets worse in the next 24 hours after initiating the prednisone. She was also instructed continue fluids and to ice her tender areas. She should return if she starts having a fever, worsening shortness of breath, or increasing malaise.   [place] controlled substance database reviewed with no concerns.

## 2017-12-24 ENCOUNTER — Ambulatory Visit (INDEPENDENT_AMBULATORY_CARE_PROVIDER_SITE_OTHER): Payer: BLUE CROSS/BLUE SHIELD | Admitting: Physician Assistant

## 2017-12-24 ENCOUNTER — Encounter: Payer: Self-pay | Admitting: Physician Assistant

## 2017-12-24 VITALS — BP 142/70 | HR 98 | Ht 69.0 in | Wt 208.0 lb

## 2017-12-24 DIAGNOSIS — R1012 Left upper quadrant pain: Secondary | ICD-10-CM | POA: Diagnosis not present

## 2017-12-24 DIAGNOSIS — R1013 Epigastric pain: Secondary | ICD-10-CM | POA: Diagnosis not present

## 2017-12-24 LAB — POCT URINALYSIS DIPSTICK
Bilirubin, UA: NEGATIVE
Blood, UA: NEGATIVE
Glucose, UA: NEGATIVE
Ketones, UA: NEGATIVE
LEUKOCYTES UA: NEGATIVE
NITRITE UA: NEGATIVE
PROTEIN UA: NEGATIVE
Spec Grav, UA: 1.01 (ref 1.010–1.025)
Urobilinogen, UA: 0.2 E.U./dL
pH, UA: 5 (ref 5.0–8.0)

## 2017-12-24 LAB — BASIC METABOLIC PANEL
BUN/Creatinine Ratio: 18 (calc) (ref 6–22)
BUN: 27 mg/dL — AB (ref 7–25)
CO2: 27 mmol/L (ref 20–32)
CREATININE: 1.47 mg/dL — AB (ref 0.50–0.99)
Calcium: 10.1 mg/dL (ref 8.6–10.4)
Chloride: 98 mmol/L (ref 98–110)
GLUCOSE: 140 mg/dL — AB (ref 65–99)
Potassium: 5 mmol/L (ref 3.5–5.3)
Sodium: 135 mmol/L (ref 135–146)

## 2017-12-24 LAB — LIPASE: LIPASE: 56 U/L (ref 7–60)

## 2017-12-24 LAB — CBC WITH DIFFERENTIAL/PLATELET
BASOS PCT: 0.4 %
Basophils Absolute: 58 cells/uL (ref 0–200)
EOS ABS: 218 {cells}/uL (ref 15–500)
Eosinophils Relative: 1.5 %
HCT: 33.4 % — ABNORMAL LOW (ref 35.0–45.0)
HEMOGLOBIN: 11.4 g/dL — AB (ref 11.7–15.5)
Lymphs Abs: 2393 cells/uL (ref 850–3900)
MCH: 30 pg (ref 27.0–33.0)
MCHC: 34.1 g/dL (ref 32.0–36.0)
MCV: 87.9 fL (ref 80.0–100.0)
MONOS PCT: 7.4 %
MPV: 10.3 fL (ref 7.5–12.5)
NEUTROS ABS: 10759 {cells}/uL — AB (ref 1500–7800)
Neutrophils Relative %: 74.2 %
Platelets: 372 10*3/uL (ref 140–400)
RBC: 3.8 10*6/uL (ref 3.80–5.10)
RDW: 12.4 % (ref 11.0–15.0)
Total Lymphocyte: 16.5 %
WBC mixed population: 1073 cells/uL — ABNORMAL HIGH (ref 200–950)
WBC: 14.5 10*3/uL — ABNORMAL HIGH (ref 3.8–10.8)

## 2017-12-24 LAB — AMYLASE: AMYLASE: 37 U/L (ref 21–101)

## 2017-12-24 MED ORDER — SUCRALFATE 1 G PO TABS: 1.0000 g | ORAL_TABLET | Freq: Three times a day (TID) | ORAL | 0 refills | Status: DC

## 2017-12-24 MED ORDER — OMEPRAZOLE 40 MG PO CPDR
40.0000 mg | DELAYED_RELEASE_CAPSULE | Freq: Two times a day (BID) | ORAL | 1 refills | Status: DC
Start: 2017-12-24 — End: 2017-12-25

## 2017-12-24 NOTE — Progress Notes (Signed)
Subjective:    Patient ID: Kayla Gallagher, female    DOB: 1951/10/16, 67 y.o.   MRN: [id number]  HPI  Patient is a 67 year old female with hypertension, type 2 diabetes, hyperlipidemia and recent history of right lower lobe pneumonia.  She presents to the clinic today with left upper quadrant pain and epigastric pain.  She was seen on 12/06/17 and treated for pneumonia with azithromycin.  She was seen again in the clinic on 12/10/17 and treated for costochondritis and postinfectious cough with prednisone and Hycodan.  She was having some pain with deep breathing at that point and felt like it was due to costochondritis.  The pain did improve over the next couple of days and then for the next 2 weeks it has worsened.  She would rate pain 7 out of 10.  Coughing, sneezing, deep breathing all make pain worse.  She denies any bowel or bladder pain or dysfunction.  She denies any hematochezia or melena in stool.  She denies any nausea or vomiting.  She does not recognize if food makes better or worse.  She does have a history of colitis in August 2018 that presented with left lower quadrant pain.  She did have a CT as well that was unremarkable.  On 12/18/16 she had her last colonoscopy and was told to follow-up in 10 years.  She does admit to taking a lot of ibuprofen in the last few weeks.  She has laid off recently because she felt like it was not working.  .. Active Ambulatory Problems    Diagnosis Date Noted  . History of hepatitis C 01/07/2013  . Essential hypertension, benign 01/07/2013  . Hyperlipidemia 01/07/2013  . History of breast cancer 01/07/2013  . Depression 01/07/2013  . 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  . Hyperplastic colon polyp 01/22/2013  . Type 2 diabetes mellitus ([place]) 02/21/2013  . Breast mass, right 02/21/2013  . Actinic keratosis 04/16/2013  . Temporal arteritis ([place]) 06/18/2013  . Strain of elbow, left 06/01/2014  . Anxiety state 05/04/2015  . Obesity 09/09/2015  . DDD  (degenerative disc disease), cervical 06/22/2017  . Acute abdominal pain in left lower quadrant 06/22/2017  . Community acquired pneumonia of right middle lobe of lung ([place]) 12/06/2017   Resolved Ambulatory Problems    Diagnosis Date Noted  . No Resolved Ambulatory Problems   Past Medical History:  Diagnosis Date  . Depression   . Diabetes mellitus without complication ([place])   . Essential hypertension, benign 01/07/2013  . Headache(784.0)   . History of anemia   . History of breast cancer   . History of hepatitis C 01/07/2013  . Hyperlipidemia   . Hypertension        Review of Systems  All other systems reviewed and are negative.      Objective:   Physical Exam  Constitutional: She is oriented to person, place, and time. She appears well-developed and well-nourished.  HENT:  Head: Normocephalic and atraumatic.  Eyes: Conjunctivae are normal. Right eye exhibits no discharge. Left eye exhibits no discharge.  Neck: Normal range of motion. Neck supple.  Cardiovascular: Normal rate, regular rhythm and normal heart sounds.  Pulmonary/Chest: Effort normal and breath sounds normal.  No CVA tenderness.   Abdominal: Soft. Bowel sounds are normal. She exhibits no distension and no mass. There is tenderness. There is guarding.  Guarding and tenderness over epigastric area.  Just tenderness over LUQ pain.   Lymphadenopathy:    She has no cervical adenopathy.  Neurological: She is alert and oriented to person, place, and time.  Psychiatric: She has a normal mood and affect. Her behavior is normal.          Assessment & Plan:  [name] KitchenDiagnoses and all orders for this visit:  Epigastric pain -     CBC with Differential/Platelet -     sucralfate (CARAFATE) 1 g tablet; Take 1 tablet (1 g total) by mouth 4 (four) times daily -  with meals and at bedtime. -     omeprazole (PRILOSEC) 40 MG capsule; Take 1 capsule (40 mg total) by mouth 2 (two) times daily. -     Lipase -      Amylase -     Basic metabolic panel -     H. pylori breath test  Left upper quadrant pain -     POCT urinalysis dipstick   .Marland Kitchen Results for orders placed or performed in visit on 12/24/17  POCT urinalysis dipstick  Result Value Ref Range   Color, UA yellow    Clarity, UA clear    Glucose, UA neg    Bilirubin, UA neg    Ketones, UA neg    Spec Grav, UA 1.010 1.010 - 1.025   Blood, UA neg    pH, UA 5.0 5.0 - 8.0   Protein, UA neg    Urobilinogen, UA 0.2 0.2 or 1.0 E.U./dL   Nitrite, UA neg    Leukocytes, UA Negative Negative   Appearance     Odor    UA negative today.   After evaluating patient today I suspect she has peptic ulcer disease.  She is not on a PPI.  Urea breath test was done in office today to rule out H. pylori.  She was given a GI cocktail after testing.  She was instructed to start omeprazole 40 mg twice a day and Carafate with meals 3 times a day for the next week.  Instructed patient to call back in the morning with update of symptoms.  Discussed red flag symptoms of nausea, vomiting, blood in stool, worsening abdominal pain.  I am checking a CBC, CMP, amylase, lipase to look for possible pancreatitis and also check white blood count for infection as well as looking at hemoglobin to make sure she is not losing blood anywhere.  Patient was instructed strongly to not take any other anti-inflammatories at this time as she is being evaluated.  She is aware of this and agrees.  Marland Kitchen.Spent 30 minutes with patient and greater than 50 percent of visit spent counseling patient regarding treatment plan.

## 2017-12-25 ENCOUNTER — Other Ambulatory Visit: Payer: Self-pay | Admitting: *Deleted

## 2017-12-25 ENCOUNTER — Telehealth: Payer: Self-pay | Admitting: *Deleted

## 2017-12-25 DIAGNOSIS — D72829 Elevated white blood cell count, unspecified: Secondary | ICD-10-CM

## 2017-12-25 DIAGNOSIS — R1013 Epigastric pain: Secondary | ICD-10-CM

## 2017-12-25 LAB — H. PYLORI BREATH TEST: H. pylori Breath Test: NOT DETECTED

## 2017-12-25 MED ORDER — OMEPRAZOLE 40 MG PO CPDR: 40.0000 mg | DELAYED_RELEASE_CAPSULE | Freq: Every day | ORAL | 0 refills | Status: DC

## 2017-12-25 NOTE — Progress Notes (Signed)
Find out how she is doing?   WBC a little elevated but could be due to h.pylori infection. If negative will recheck CBC.  No pancreatitis.   Kidney function up a bit. Make sure staying hydrated. Recheck in 1 week.

## 2017-12-25 NOTE — Telephone Encounter (Signed)
Cbc ordered

## 2017-12-27 DIAGNOSIS — E7800 Pure hypercholesterolemia, unspecified: Secondary | ICD-10-CM | POA: Diagnosis not present

## 2017-12-27 DIAGNOSIS — I10 Essential (primary) hypertension: Secondary | ICD-10-CM | POA: Diagnosis not present

## 2017-12-27 DIAGNOSIS — D72829 Elevated white blood cell count, unspecified: Secondary | ICD-10-CM | POA: Diagnosis not present

## 2017-12-27 DIAGNOSIS — E119 Type 2 diabetes mellitus without complications: Secondary | ICD-10-CM | POA: Diagnosis not present

## 2017-12-27 LAB — LIPID PANEL W/REFLEX DIRECT LDL
Cholesterol: 130 mg/dL (ref <200)
HDL: 31 mg/dL — AB (ref >50)
LDL CHOLESTEROL (CALC): 73 mg/dL
Non-HDL Cholesterol (Calc): 99 mg/dL (calc) (ref <130)
TRIGLYCERIDES: 181 mg/dL — AB (ref <150)
Total CHOL/HDL Ratio: 4.2 (calc) (ref <5.0)

## 2017-12-27 LAB — CBC WITH DIFFERENTIAL/PLATELET
BASOS ABS: 47 {cells}/uL (ref 0–200)
Basophils Relative: 0.6 %
EOS ABS: 269 {cells}/uL (ref 15–500)
EOS PCT: 3.4 %
HEMATOCRIT: 34.9 % — AB (ref 35.0–45.0)
HEMOGLOBIN: 11.5 g/dL — AB (ref 11.7–15.5)
Lymphs Abs: 2149 cells/uL (ref 850–3900)
MCH: 28.8 pg (ref 27.0–33.0)
MCHC: 33 g/dL (ref 32.0–36.0)
MCV: 87.5 fL (ref 80.0–100.0)
MONOS PCT: 8 %
MPV: 10.8 fL (ref 7.5–12.5)
NEUTROS ABS: 4803 {cells}/uL (ref 1500–7800)
NEUTROS PCT: 60.8 %
Platelets: 323 10*3/uL (ref 140–400)
RBC: 3.99 10*6/uL (ref 3.80–5.10)
RDW: 12.2 % (ref 11.0–15.0)
Total Lymphocyte: 27.2 %
WBC mixed population: 632 cells/uL (ref 200–950)
WBC: 7.9 10*3/uL (ref 3.8–10.8)

## 2017-12-27 LAB — COMPLETE METABOLIC PANEL WITH GFR
AG RATIO: 1.4 (calc) (ref 1.0–2.5)
ALBUMIN MSPROF: 4.1 g/dL (ref 3.6–5.1)
ALKALINE PHOSPHATASE (APISO): 65 U/L (ref 33–130)
ALT: 14 U/L (ref 6–29)
AST: 13 U/L (ref 10–35)
BILIRUBIN TOTAL: 0.2 mg/dL (ref 0.2–1.2)
BUN / CREAT RATIO: 16 (calc) (ref 6–22)
BUN: 23 mg/dL (ref 7–25)
CHLORIDE: 102 mmol/L (ref 98–110)
CO2: 27 mmol/L (ref 20–32)
Calcium: 9.3 mg/dL (ref 8.6–10.4)
Creat: 1.42 mg/dL — ABNORMAL HIGH (ref 0.50–0.99)
GFR, Est African American: 44 mL/min/{1.73_m2} — ABNORMAL LOW (ref >=60)
GFR, Est Non African American: 38 mL/min/{1.73_m2} — ABNORMAL LOW (ref >=60)
GLOBULIN: 3 g/dL (ref 1.9–3.7)
Glucose, Bld: 142 mg/dL — ABNORMAL HIGH (ref 65–99)
POTASSIUM: 5.1 mmol/L (ref 3.5–5.3)
SODIUM: 138 mmol/L (ref 135–146)
TOTAL PROTEIN: 7.1 g/dL (ref 6.1–8.1)

## 2017-12-27 NOTE — Telephone Encounter (Signed)
Call pt: WBC is much better. Reassuring there is no infection. How are you feeling?

## 2017-12-27 NOTE — Progress Notes (Signed)
Call pt: LDL looks good. TG up at bit. Are you on fish oil? If not I would start 4000mg  a day. Kidney function is down some for 3 days ago but not back down to baseline. Some of kidney function increase could be due to ibuprofen usuage. I would stay away from any anti-inflammatories. Will continue to keep our eye on this. Recheck in 1 month.

## 2017-12-28 ENCOUNTER — Telehealth: Payer: Self-pay | Admitting: *Deleted

## 2017-12-28 DIAGNOSIS — N289 Disorder of kidney and ureter, unspecified: Secondary | ICD-10-CM

## 2017-12-28 NOTE — Telephone Encounter (Signed)
CMP ordered to recheck kidney function in one month.

## 2018-01-20 ENCOUNTER — Other Ambulatory Visit: Payer: Self-pay | Admitting: Physician Assistant

## 2018-01-20 DIAGNOSIS — R1013 Epigastric pain: Secondary | ICD-10-CM

## 2018-01-25 ENCOUNTER — Other Ambulatory Visit: Payer: Self-pay | Admitting: *Deleted

## 2018-01-25 DIAGNOSIS — E119 Type 2 diabetes mellitus without complications: Secondary | ICD-10-CM

## 2018-01-25 MED ORDER — PIOGLITAZONE HCL 45 MG PO TABS: 45.0000 mg | ORAL_TABLET | Freq: Every day | ORAL | 0 refills | Status: DC

## 2018-01-29 ENCOUNTER — Ambulatory Visit (INDEPENDENT_AMBULATORY_CARE_PROVIDER_SITE_OTHER): Payer: BLUE CROSS/BLUE SHIELD | Admitting: Physician Assistant

## 2018-01-29 ENCOUNTER — Encounter: Payer: Self-pay | Admitting: Physician Assistant

## 2018-01-29 VITALS — BP 127/75 | HR 84 | Ht 69.0 in | Wt 207.0 lb

## 2018-01-29 DIAGNOSIS — Z1231 Encounter for screening mammogram for malignant neoplasm of breast: Secondary | ICD-10-CM

## 2018-01-29 DIAGNOSIS — Z13820 Encounter for screening for osteoporosis: Secondary | ICD-10-CM | POA: Diagnosis not present

## 2018-01-29 DIAGNOSIS — Z0000 Encounter for general adult medical examination without abnormal findings: Secondary | ICD-10-CM

## 2018-01-29 DIAGNOSIS — N951 Menopausal and female climacteric states: Secondary | ICD-10-CM | POA: Diagnosis not present

## 2018-01-29 DIAGNOSIS — I10 Essential (primary) hypertension: Secondary | ICD-10-CM

## 2018-01-29 DIAGNOSIS — E119 Type 2 diabetes mellitus without complications: Secondary | ICD-10-CM

## 2018-01-29 DIAGNOSIS — F329 Major depressive disorder, single episode, unspecified: Secondary | ICD-10-CM | POA: Diagnosis not present

## 2018-01-29 DIAGNOSIS — R7989 Other specified abnormal findings of blood chemistry: Secondary | ICD-10-CM | POA: Diagnosis not present

## 2018-01-29 DIAGNOSIS — F32A Depression, unspecified: Secondary | ICD-10-CM

## 2018-01-29 DIAGNOSIS — N183 Chronic kidney disease, stage 3 (moderate): Secondary | ICD-10-CM | POA: Diagnosis not present

## 2018-01-29 MED ORDER — ALPRAZOLAM 0.5 MG PO TABS: 0.5000 mg | ORAL_TABLET | Freq: Every evening | ORAL | 2 refills | Status: DC | PRN

## 2018-01-29 MED ORDER — EST ESTROGENS-METHYLTEST 1.25-2.5 MG PO TABS: 1.0000 | ORAL_TABLET | Freq: Every day | ORAL | 3 refills | Status: DC

## 2018-01-29 MED ORDER — PIOGLITAZONE HCL 45 MG PO TABS: 45.0000 mg | ORAL_TABLET | Freq: Every day | ORAL | 0 refills | Status: DC

## 2018-01-29 MED ORDER — METFORMIN HCL 1000 MG PO TABS: 1000.0000 mg | ORAL_TABLET | Freq: Two times a day (BID) | ORAL | 0 refills | Status: DC

## 2018-01-29 NOTE — Patient Instructions (Signed)

## 2018-01-29 NOTE — Progress Notes (Signed)
Subjective:     Kayla MeuseDeborah A Gallagher is a 67 y.o. female and is here for a comprehensive physical exam. The patient reports no problems.    Social History   Socioeconomic History  . Marital status: Single    Spouse name: Not on file  . Number of children: Not on file  . Years of education: Not on file  . Highest education level: Not on file  Social Needs  . Financial resource strain: Not on file  . Food insecurity - worry: Not on file  . Food insecurity - inability: Not on file  . Transportation needs - medical: Not on file  . Transportation needs - non-medical: Not on file  Occupational History  . Not on file  Tobacco Use  . Smoking status: Never Smoker  . Smokeless tobacco: Never Used  Substance and Sexual Activity  . Alcohol use: No  . Drug use: No  . Sexual activity: Not Currently  Other Topics Concern  . Not on file  Social History Narrative  . Not on file   Health Maintenance  Topic Date Due  . DEXA SCAN  10/21/2015  . FOOT EXAM  04/27/2018  . HEMOGLOBIN A1C  05/03/2018  . OPHTHALMOLOGY EXAM  09/22/2018  . MAMMOGRAM  12/01/2018  . COLONOSCOPY  12/18/2026  . TETANUS/TDAP  04/28/2027  . Hepatitis C Screening  Completed  . INFLUENZA VACCINE  Discontinued  . PNA vac Low Risk Adult  Discontinued    The following portions of the patient's history were reviewed and updated as appropriate: allergies, current medications, past family history, past medical history, past social history, past surgical history and problem list.  Review of Systems Pertinent items noted in HPI and remainder of comprehensive ROS otherwise negative.   Objective:    BP 127/75   Pulse 84   Ht 5\' 9"  (1.753 m)   Wt 207 lb (93.9 kg)   BMI 30.57 kg/m  General appearance: alert, cooperative and appears stated age Head: Normocephalic, without obvious abnormality, atraumatic Eyes: conjunctivae/corneas clear. PERRL, EOM's intact. Fundi benign. Ears: normal TM's and external ear canals both  ears Nose: Nares normal. Septum midline. Mucosa normal. No drainage or sinus tenderness. Throat: lips, mucosa, and tongue normal; teeth and gums normal Neck: no adenopathy, no carotid bruit, no JVD, supple, symmetrical, trachea midline and thyroid not enlarged, symmetric, no tenderness/mass/nodules Back: symmetric, no curvature. ROM normal. No CVA tenderness. Lungs: clear to auscultation bilaterally Heart: regular rate and rhythm, S1, S2 normal, no murmur, click, rub or gallop Abdomen: soft, non-tender; bowel sounds normal; no masses,  no organomegaly Extremities: extremities normal, atraumatic, no cyanosis or edema Pulses: 2+ and symmetric Skin: Skin color, texture, turgor normal. No rashes or lesions Lymph nodes: Cervical, supraclavicular, and axillary nodes normal. Neurologic: Grossly normal    Assessment:    Healthy female exam.      Plan:  Marland Kitchen.Marland Kitchen.Kayla Gallagher was seen today for annual exam.  Diagnoses and all orders for this visit:  Routine physical examination -     MM SCREENING BREAST TOMO BILATERAL -     DG Bone Density -     estrogens-methylTEST (ESTRATEST) 1.25-2.5 MG tablet; Take 1 tablet by mouth daily. -     metFORMIN (GLUCOPHAGE) 1000 MG tablet; Take 1 tablet (1,000 mg total) by mouth 2 (two) times daily with a meal. -     pioglitazone (ACTOS) 45 MG tablet; Take 1 tablet (45 mg total) by mouth daily. -     Hemoglobin A1c -  COMPLETE METABOLIC PANEL WITH GFR  Osteoporosis screening -     DG Bone Density  Visit for screening mammogram -     MM SCREENING BREAST TOMO BILATERAL  Depression, unspecified depression type -     ALPRAZolam (XANAX) 0.5 MG tablet; Take 1 tablet (0.5 mg total) by mouth at bedtime as needed for anxiety.  Menopausal symptoms -     estrogens-methylTEST (ESTRATEST) 1.25-2.5 MG tablet; Take 1 tablet by mouth daily.  Type 2 diabetes mellitus without complication, without long-term current use of insulin (HCC) -     metFORMIN (GLUCOPHAGE) 1000 MG  tablet; Take 1 tablet (1,000 mg total) by mouth 2 (two) times daily with a meal. -     pioglitazone (ACTOS) 45 MG tablet; Take 1 tablet (45 mg total) by mouth daily. -     Hemoglobin A1c  Elevated serum creatinine -     COMPLETE METABOLIC PANEL WITH GFR  Essential hypertension, benign   ... Depression screen [facility] 2/9 01/29/2018 11/02/2017 06/22/2017 04/28/2017  Decreased Interest 0 0 0 0  Down, Depressed, Hopeless 0 0 0 0  PHQ - 2 Score 0 0 0 0  Altered sleeping 1 - - -  Tired, decreased energy 0 - - -  Change in appetite 1 - - -  Feeling bad or failure about yourself  0 - - -  Trouble concentrating 0 - - -  Moving slowly or fidgety/restless 0 - - -  Suicidal thoughts 0 - - -  PHQ-9 Score 2 - - -  Difficult doing work/chores Not difficult at all - - -   .Marland Kitchen GAD 7 : Generalized Anxiety Score 01/29/2018  Nervous, Anxious, on Edge 0  Control/stop worrying 0  Worry too much - different things 0  Trouble relaxing 0  Restless 1  Easily annoyed or irritable 0  Afraid - awful might happen 0  Total GAD 7 Score 1  Anxiety Difficulty Not difficult at all    .Marland Kitchen Discussed 150 minutes of exercise a week.  Encouraged vitamin D 1000 units and Calcium 1300mg  or 4 servings of dairy a day.  Recent labs done.  Discussed shingles vaccine. Pt will decide if she wants.  Mammogram and Bone Density ordered today.  Colonoscopy up to date.  Consider baby ASA 81mg  daily.   Will check A!C in labs.  BP controlled.   Elevated serum creatine- will recheck CMP today.     See After Visit Summary for Counseling Recommendations

## 2018-01-30 ENCOUNTER — Other Ambulatory Visit: Payer: Self-pay | Admitting: Physician Assistant

## 2018-01-30 DIAGNOSIS — Z0000 Encounter for general adult medical examination without abnormal findings: Secondary | ICD-10-CM

## 2018-01-30 DIAGNOSIS — Z780 Asymptomatic menopausal state: Secondary | ICD-10-CM

## 2018-01-30 LAB — COMPLETE METABOLIC PANEL WITH GFR
AG Ratio: 1.3 (calc) (ref 1.0–2.5)
ALBUMIN MSPROF: 4.4 g/dL (ref 3.6–5.1)
ALKALINE PHOSPHATASE (APISO): 58 U/L (ref 33–130)
ALT: 15 U/L (ref 6–29)
AST: 18 U/L (ref 10–35)
BUN / CREAT RATIO: 22 (calc) (ref 6–22)
BUN: 26 mg/dL — AB (ref 7–25)
CALCIUM: 10.4 mg/dL (ref 8.6–10.4)
CHLORIDE: 101 mmol/L (ref 98–110)
CO2: 26 mmol/L (ref 20–32)
CREATININE: 1.17 mg/dL — AB (ref 0.50–0.99)
GFR, Est African American: 56 mL/min/{1.73_m2} — ABNORMAL LOW (ref >=60)
GFR, Est Non African American: 48 mL/min/{1.73_m2} — ABNORMAL LOW (ref >=60)
GLOBULIN: 3.5 g/dL (ref 1.9–3.7)
GLUCOSE: 155 mg/dL — AB (ref 65–99)
Potassium: 5.1 mmol/L (ref 3.5–5.3)
SODIUM: 138 mmol/L (ref 135–146)
TOTAL PROTEIN: 7.9 g/dL (ref 6.1–8.1)
Total Bilirubin: 0.3 mg/dL (ref 0.2–1.2)

## 2018-01-30 LAB — HEMOGLOBIN A1C
Hgb A1c MFr Bld: 7.2 % of total Hgb — ABNORMAL HIGH (ref <5.7)
Mean Plasma Glucose: 160 (calc)
eAG (mmol/L): 8.9 (calc)

## 2018-01-31 NOTE — Progress Notes (Signed)
Call pt: A!C is up from 3 months ago. You are now above 7. It is indicated to make some medication changes. Do you think you could make diet changes or do we need to add another medication? Recheck in 3 months.   Kidney function a lot better! Great news.

## 2018-02-01 NOTE — Progress Notes (Signed)
Ok start taking medication as prescribed with diet changes and recheck in 3 months.

## 2018-02-06 ENCOUNTER — Ambulatory Visit (INDEPENDENT_AMBULATORY_CARE_PROVIDER_SITE_OTHER): Payer: BLUE CROSS/BLUE SHIELD

## 2018-02-06 DIAGNOSIS — Z13820 Encounter for screening for osteoporosis: Secondary | ICD-10-CM | POA: Diagnosis not present

## 2018-02-06 DIAGNOSIS — Z1231 Encounter for screening mammogram for malignant neoplasm of breast: Secondary | ICD-10-CM | POA: Diagnosis not present

## 2018-02-06 DIAGNOSIS — Z780 Asymptomatic menopausal state: Secondary | ICD-10-CM | POA: Diagnosis not present

## 2018-02-06 NOTE — Progress Notes (Signed)
Call pt: normal mammogram. Follow up in 1 year.

## 2018-02-06 NOTE — Progress Notes (Signed)
Call pt: normal bone density.

## 2018-03-03 ENCOUNTER — Other Ambulatory Visit: Payer: Self-pay | Admitting: Physician Assistant

## 2018-03-03 DIAGNOSIS — I10 Essential (primary) hypertension: Secondary | ICD-10-CM

## 2018-03-14 ENCOUNTER — Other Ambulatory Visit: Payer: Self-pay | Admitting: *Deleted

## 2018-03-14 DIAGNOSIS — I10 Essential (primary) hypertension: Secondary | ICD-10-CM

## 2018-03-14 MED ORDER — LISINOPRIL 40 MG PO TABS: 40.0000 mg | ORAL_TABLET | Freq: Every day | ORAL | 1 refills | Status: DC

## 2018-03-21 ENCOUNTER — Other Ambulatory Visit: Payer: Self-pay | Admitting: Physician Assistant

## 2018-03-21 DIAGNOSIS — R1013 Epigastric pain: Secondary | ICD-10-CM

## 2018-04-20 ENCOUNTER — Other Ambulatory Visit: Payer: Self-pay | Admitting: Physician Assistant

## 2018-04-20 DIAGNOSIS — R1013 Epigastric pain: Secondary | ICD-10-CM

## 2018-04-22 NOTE — Telephone Encounter (Signed)
[facility] requesting RF on Omeprazole.   RX is no longer on patient's med list, it seems to have been removed around February.   Can you please review and let me know if RF is appropriate? Thanks!

## 2018-04-23 ENCOUNTER — Other Ambulatory Visit: Payer: Self-pay | Admitting: Physician Assistant

## 2018-04-23 DIAGNOSIS — Z0000 Encounter for general adult medical examination without abnormal findings: Secondary | ICD-10-CM

## 2018-04-23 DIAGNOSIS — E119 Type 2 diabetes mellitus without complications: Secondary | ICD-10-CM

## 2018-05-22 ENCOUNTER — Other Ambulatory Visit: Payer: Self-pay | Admitting: Physician Assistant

## 2018-05-22 DIAGNOSIS — I10 Essential (primary) hypertension: Secondary | ICD-10-CM

## 2018-05-24 ENCOUNTER — Ambulatory Visit (INDEPENDENT_AMBULATORY_CARE_PROVIDER_SITE_OTHER): Payer: BLUE CROSS/BLUE SHIELD | Admitting: Physician Assistant

## 2018-05-24 ENCOUNTER — Encounter: Payer: Self-pay | Admitting: Physician Assistant

## 2018-05-24 DIAGNOSIS — E782 Mixed hyperlipidemia: Secondary | ICD-10-CM | POA: Diagnosis not present

## 2018-05-24 DIAGNOSIS — F3289 Other specified depressive episodes: Secondary | ICD-10-CM | POA: Diagnosis not present

## 2018-05-24 DIAGNOSIS — E119 Type 2 diabetes mellitus without complications: Secondary | ICD-10-CM | POA: Diagnosis not present

## 2018-05-24 DIAGNOSIS — R1013 Epigastric pain: Secondary | ICD-10-CM

## 2018-05-24 DIAGNOSIS — I10 Essential (primary) hypertension: Secondary | ICD-10-CM | POA: Diagnosis not present

## 2018-05-24 LAB — POCT GLYCOSYLATED HEMOGLOBIN (HGB A1C): HEMOGLOBIN A1C: 6.6 % — AB (ref 4.0–5.6)

## 2018-05-24 MED ORDER — OMEPRAZOLE 40 MG PO CPDR: DELAYED_RELEASE_CAPSULE | ORAL | 4 refills | Status: DC

## 2018-05-24 MED ORDER — PIOGLITAZONE HCL 45 MG PO TABS: 45.0000 mg | ORAL_TABLET | Freq: Every day | ORAL | 0 refills | Status: DC

## 2018-05-24 MED ORDER — LISINOPRIL 40 MG PO TABS: 40.0000 mg | ORAL_TABLET | Freq: Every day | ORAL | 1 refills | Status: DC

## 2018-05-24 MED ORDER — PAROXETINE HCL 40 MG PO TABS: 40.0000 mg | ORAL_TABLET | Freq: Every morning | ORAL | 1 refills | Status: DC

## 2018-05-24 MED ORDER — METFORMIN HCL 1000 MG PO TABS: 1000.0000 mg | ORAL_TABLET | Freq: Two times a day (BID) | ORAL | 0 refills | Status: DC

## 2018-05-24 MED ORDER — ATORVASTATIN CALCIUM 10 MG PO TABS: 10.0000 mg | ORAL_TABLET | Freq: Every day | ORAL | 1 refills | Status: DC

## 2018-05-24 MED ORDER — AMLODIPINE BESYLATE 10 MG PO TABS: ORAL_TABLET | ORAL | 1 refills | Status: DC

## 2018-05-24 NOTE — Progress Notes (Signed)
Subjective:    Patient ID: Kayla Gallagher, female    DOB: 26-Jun-1951, 67 y.o.   MRN: [id number]  HPI Pt is a 67 year old female presenting to the clinic for an A1c follow up for T2DM. Pt says she is doing okay. She says she has been out of her Actos for 3 weeks now and has been having a hard time with craving sweets. Pt also says she hasn't been exercising the way she wants to due to the weather being hot outside. She has no complaints at this time. No cp, SOB, fever chills, N/V or any other symptoms. Not checking sugars. No open wounds or sores. No hypoglycemia.   Her mood is fairly well controlled. She denies any SI/HC. She is going through a divorce and has some work stress but overall doing pretty well. No complaints with paxil.   Epigastric pain resolved.   .. Active Ambulatory Problems    Diagnosis Date Noted  . History of hepatitis C 01/07/2013  . Essential hypertension, benign 01/07/2013  . Hyperlipidemia 01/07/2013  . History of breast cancer 01/07/2013  . Depression 01/07/2013  . 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  . Hyperplastic colon polyp 01/22/2013  . Type 2 diabetes mellitus ([place]) 02/21/2013  . Breast mass, right 02/21/2013  . Actinic keratosis 04/16/2013  . Temporal arteritis ([place]) 06/18/2013  . Anxiety state 05/04/2015  . Obesity 09/09/2015  . DDD (degenerative disc disease), cervical 06/22/2017  . Acute abdominal pain in left lower quadrant 06/22/2017  . Elevated serum creatinine 01/29/2018  . Menopausal symptoms 01/29/2018   Resolved Ambulatory Problems    Diagnosis Date Noted  . Strain of elbow, left 06/01/2014  . Community acquired pneumonia of right middle lobe of lung ([place]) 12/06/2017   Past Medical History:  Diagnosis Date  . Depression   . Diabetes mellitus without complication ([place])   . Essential hypertension, benign 01/07/2013  . Headache(784.0)   . History of anemia   . History of hepatitis C 01/07/2013  . Hyperlipidemia   . Hypertension      Review  of Systems  Constitutional: Positive for appetite change. Negative for chills and fever.  HENT: Negative.   Eyes: Negative.   Respiratory: Negative.  Negative for cough and shortness of breath.   Cardiovascular: Negative.  Negative for chest pain and palpitations.  Gastrointestinal: Negative.   Genitourinary: Negative.   Musculoskeletal: Negative.   Neurological: Negative.   Hematological: Negative.   Psychiatric/Behavioral: Positive for agitation (with her job at the moment). Negative for behavioral problems and dysphoric mood. The patient is nervous/anxious.   All other systems reviewed and are negative.      Objective:   Physical Exam  Constitutional: She is oriented to person, place, and time. She appears well-developed and well-nourished. No distress.  HENT:  Head: Normocephalic and atraumatic.  Eyes: Pupils are equal, round, and reactive to light. Conjunctivae and EOM are normal.  Neck: Normal range of motion.  Cardiovascular: Normal rate, regular rhythm, normal heart sounds and intact distal pulses.  Pulmonary/Chest: Effort normal and breath sounds normal.  Musculoskeletal: Normal range of motion.  Neurological: She is alert and oriented to person, place, and time.  Skin: Skin is warm and dry.  Psychiatric: She has a normal mood and affect. Her behavior is normal. Thought content normal.      Assessment & Plan:  Kaileia was seen today for follow-up.  Diagnoses and all orders for this visit:  Type 2 diabetes mellitus without complication, without long-term current  use of insulin (HCC) -     pioglitazone (ACTOS) 45 MG tablet; Take 1 tablet (45 mg total) by mouth daily. -     POCT HgB A1C -     metFORMIN (GLUCOPHAGE) 1000 MG tablet; Take 1 tablet (1,000 mg total) by mouth 2 (two) times daily with a meal.  Other depression -     PARoxetine (PAXIL) 40 MG tablet; Take 1 tablet (40 mg total) by mouth every morning.  Mixed hyperlipidemia -     atorvastatin (LIPITOR) 10 MG  tablet; Take 1 tablet (10 mg total) by mouth daily.  Essential hypertension, benign -     lisinopril (PRINIVIL,ZESTRIL) 40 MG tablet; Take 1 tablet (40 mg total) by mouth daily. -     amLODipine (NORVASC) 10 MG tablet; ONE TABLET BY MOUTH EVERY DAY FOR BLOOD PRESSURE CONTROL  Epigastric pain -     omeprazole (PRILOSEC) 40 MG capsule; TAKE 1 CAPSULE BY MOUTH EVERY DAY   .Marland Kitchen Results for orders placed or performed in visit on 05/24/18  POCT HgB A1C  Result Value Ref Range   Hemoglobin A1C 6.6 (A) 4.0 - 5.6 %   HbA1c POC (<> result, manual entry)  4.0 - 5.6 %   HbA1c, POC (prediabetic range)  5.7 - 6.4 %   HbA1c, POC (controlled diabetic range)  0.0 - 7.0 %   .Marland Kitchen Lab Results  Component Value Date   CREATININE 1.17 (H) 01/29/2018   CREATININE 1.42 (H) 12/27/2017   CREATININE 1.47 (H) 12/24/2017   .Marland Kitchen Depression screen [facility] 2/9 01/29/2018 11/02/2017 06/22/2017 04/28/2017  Decreased Interest 0 0 0 0  Down, Depressed, Hopeless 0 0 0 0  PHQ - 2 Score 0 0 0 0  Altered sleeping 1 - - -  Tired, decreased energy 0 - - -  Change in appetite 1 - - -  Feeling bad or failure about yourself  0 - - -  Trouble concentrating 0 - - -  Moving slowly or fidgety/restless 0 - - -  Suicidal thoughts 0 - - -  PHQ-9 Score 2 - - -  Difficult doing work/chores Not difficult at all - - -    -Received refill for Actos. -A1c has improved from 7.2 to now 6.6 as shown above.   Pt was encouraged to continue taking Metformin and Actos as prescribed. -Cr is improving as shown above. -BP slightly elevated but rechecked and went down from (141/71) to (136/82).   Pt has been taking sinus medicine for the past few days. -on STATIN. -on ASA. -Pt declined Shingles and PNA vaccine.  -Pt reports having an eye exam 6-8 months ago. She says it was normal.  -Encouraged pt to exercise and use her gym membership.  Reminded her of the goal (150 minutes of exercise a week) -Encouraged pt to consider vitamin D 1000 units  and Calcium 1355m or 4 servings  of dairy a day.  -Recent labs 3/19 and uptodate.  -refilled paxil for 6 months.  -epigastric pain resolved. Omeprazole as needed.   .. Diabetic Foot Exam - Simple   Simple Foot Form Diabetic Foot exam was performed with the following findings:  Yes 05/24/2018  9:04 AM  Visual Inspection No deformities, no ulcerations, no other skin breakdown bilaterally:  Yes Sensation Testing Intact to touch and monofilament testing bilaterally:  Yes Pulse Check Posterior Tibialis and Dorsalis pulse intact bilaterally:  Yes Comments     Follow up in 3 months.

## 2018-08-19 ENCOUNTER — Other Ambulatory Visit: Payer: Self-pay | Admitting: Physician Assistant

## 2018-08-19 DIAGNOSIS — Z0000 Encounter for general adult medical examination without abnormal findings: Secondary | ICD-10-CM

## 2018-08-19 DIAGNOSIS — N951 Menopausal and female climacteric states: Secondary | ICD-10-CM

## 2018-09-09 ENCOUNTER — Encounter: Payer: Self-pay | Admitting: Physician Assistant

## 2018-09-09 ENCOUNTER — Ambulatory Visit (INDEPENDENT_AMBULATORY_CARE_PROVIDER_SITE_OTHER): Payer: BLUE CROSS/BLUE SHIELD | Admitting: Physician Assistant

## 2018-09-09 VITALS — BP 135/66 | HR 90 | Ht 69.02 in | Wt 213.0 lb

## 2018-09-09 DIAGNOSIS — E119 Type 2 diabetes mellitus without complications: Secondary | ICD-10-CM

## 2018-09-09 LAB — POCT GLYCOSYLATED HEMOGLOBIN (HGB A1C): Hemoglobin A1C: 7.4 % — AB (ref 4.0–5.6)

## 2018-09-09 MED ORDER — PIOGLITAZONE HCL 45 MG PO TABS: 45.0000 mg | ORAL_TABLET | Freq: Every day | ORAL | 0 refills | Status: DC

## 2018-09-09 MED ORDER — METFORMIN HCL 1000 MG PO TABS: 1000.0000 mg | ORAL_TABLET | Freq: Two times a day (BID) | ORAL | 0 refills | Status: DC

## 2018-09-09 NOTE — Progress Notes (Signed)
   Subjective:    Patient ID: Kayla Gallagher, female    DOB: 04-Oct-1951, 67 y.o.   MRN: [id number]  HPI Pt is a 67 yo female with type II DM, HTN, HLD who presents to the clinic for 3 month follow up.   DM- she is not checking sugars. She denies any hypoglycemia. She is taking metformin and actos but she ran out a few days ago. No open sores or wounds. She is not exercising. She admits to eating ice cream a lot of nights during the week.   HTN- she denies any CP, palpitations, headaches or vision changes. Taking medication daily.   .. Active Ambulatory Problems    Diagnosis Date Noted  . History of hepatitis C 01/07/2013  . Essential hypertension, benign 01/07/2013  . Hyperlipidemia 01/07/2013  . History of breast cancer 01/07/2013  . Depression 01/07/2013  . 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  . Hyperplastic colon polyp 01/22/2013  . Type 2 diabetes mellitus ([place]) 02/21/2013  . Breast mass, right 02/21/2013  . Actinic keratosis 04/16/2013  . Temporal arteritis ([place]) 06/18/2013  . Anxiety state 05/04/2015  . Obesity 09/09/2015  . DDD (degenerative disc disease), cervical 06/22/2017  . Acute abdominal pain in left lower quadrant 06/22/2017  . Elevated serum creatinine 01/29/2018  . Menopausal symptoms 01/29/2018   Resolved Ambulatory Problems    Diagnosis Date Noted  . Strain of elbow, left 06/01/2014  . Community acquired pneumonia of right middle lobe of lung ([place]) 12/06/2017   Past Medical History:  Diagnosis Date  . Diabetes mellitus without complication ([place])   . Headache(784.0)   . History of anemia   . Hypertension      Review of Systems See HPI.     Objective:   Physical Exam  Constitutional: She is oriented to person, place, and time. She appears well-developed and well-nourished.  HENT:  Head: Normocephalic and atraumatic.  Cardiovascular: Normal rate and regular rhythm.  Pulmonary/Chest: Effort normal and breath sounds normal.  Neurological: She is alert and  oriented to person, place, and time.  Psychiatric: She has a normal mood and affect. Her behavior is normal.          Assessment & Plan:  [name] KitchenDiagnoses and all orders for this visit:  Type 2 diabetes mellitus without complication, without long-term current use of insulin (HCC) -     POCT glycosylated hemoglobin (Hb A1C) -     pioglitazone (ACTOS) 45 MG tablet; Take 1 tablet (45 mg total) by mouth daily. (Patient not taking: Reported on 09/09/2018) -     metFORMIN (GLUCOPHAGE) 1000 MG tablet; Take 1 tablet (1,000 mg total) by mouth 2 (two) times daily with a meal.    Lab Results  Component Value Date   HGBA1C 7.4 (A) 09/09/2018   A!C up.  She wants another chance to stay on current medication and work on diet.  Discussed diabetic diet.  If above 7 in 3 months. Medication adjustments need to be made.  On STATIN.  ON ACE.  Follow up in 3 months.  Pt declined flu shot.  Up to date on pneumonia vaccine.

## 2018-09-29 ENCOUNTER — Other Ambulatory Visit: Payer: Self-pay | Admitting: Physician Assistant

## 2018-09-29 DIAGNOSIS — E782 Mixed hyperlipidemia: Secondary | ICD-10-CM

## 2018-09-29 DIAGNOSIS — F3289 Other specified depressive episodes: Secondary | ICD-10-CM

## 2018-09-29 DIAGNOSIS — I10 Essential (primary) hypertension: Secondary | ICD-10-CM

## 2018-10-12 LAB — HM DIABETES EYE EXAM

## 2018-11-14 ENCOUNTER — Encounter: Payer: Self-pay | Admitting: Physician Assistant

## 2018-11-29 ENCOUNTER — Encounter: Payer: Self-pay | Admitting: Physician Assistant

## 2018-11-29 ENCOUNTER — Ambulatory Visit (INDEPENDENT_AMBULATORY_CARE_PROVIDER_SITE_OTHER): Payer: BLUE CROSS/BLUE SHIELD | Admitting: Physician Assistant

## 2018-11-29 VITALS — BP 147/72 | HR 83 | Ht 69.02 in | Wt 210.0 lb

## 2018-11-29 DIAGNOSIS — E119 Type 2 diabetes mellitus without complications: Secondary | ICD-10-CM | POA: Diagnosis not present

## 2018-11-29 DIAGNOSIS — E1169 Type 2 diabetes mellitus with other specified complication: Secondary | ICD-10-CM

## 2018-11-29 DIAGNOSIS — I10 Essential (primary) hypertension: Secondary | ICD-10-CM | POA: Diagnosis not present

## 2018-11-29 DIAGNOSIS — E785 Hyperlipidemia, unspecified: Secondary | ICD-10-CM

## 2018-11-29 LAB — POCT GLYCOSYLATED HEMOGLOBIN (HGB A1C): HEMOGLOBIN A1C: 7.2 % — AB (ref 4.0–5.6)

## 2018-11-29 MED ORDER — HYDROCHLOROTHIAZIDE 25 MG PO TABS: ORAL_TABLET | ORAL | 1 refills | Status: DC

## 2018-11-29 MED ORDER — LISINOPRIL 40 MG PO TABS: 40.0000 mg | ORAL_TABLET | Freq: Every day | ORAL | 1 refills | Status: DC

## 2018-11-29 MED ORDER — PIOGLITAZONE HCL 30 MG PO TABS: 30.0000 mg | ORAL_TABLET | Freq: Every day | ORAL | 1 refills | Status: DC

## 2018-11-29 MED ORDER — METFORMIN HCL 1000 MG PO TABS: 1000.0000 mg | ORAL_TABLET | Freq: Two times a day (BID) | ORAL | 0 refills | Status: DC

## 2018-11-29 NOTE — Progress Notes (Signed)
Subjective:    Patient ID: Kayla Gallagher, female    DOB: 1950/12/10, 68 y.o.   MRN: [id number]  HPI: This is a 68 year old female who presents today for a 3 month follow up for: DM, HTN, HLD. Patient has not been checking her sugars at home and has only been taking her Metformin 1033m BID, but did not fill her Rx for Actos. She states her BP which she has been checking periodically usually runs around 150/90. She denies any CP, palpitations or SOB.   .Marland Kitchen Active Ambulatory Problems    Diagnosis Date Noted  . History of hepatitis C 01/07/2013  . Essential hypertension, benign 01/07/2013  . Hyperlipidemia 01/07/2013  . History of breast cancer 01/07/2013  . Depression 01/07/2013  . 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  . Hyperplastic colon polyp 01/22/2013  . Type 2 diabetes mellitus ([place] 02/21/2013  . Breast mass, right 02/21/2013  . Actinic keratosis 04/16/2013  . Temporal arteritis ([place] 06/18/2013  . Anxiety state 05/04/2015  . Obesity 09/09/2015  . DDD (degenerative disc disease), cervical 06/22/2017  . Acute abdominal pain in left lower quadrant 06/22/2017  . Elevated serum creatinine 01/29/2018  . Menopausal symptoms 01/29/2018   Resolved Ambulatory Problems    Diagnosis Date Noted  . Strain of elbow, left 06/01/2014  . Community acquired pneumonia of right middle lobe of lung ([place] 12/06/2017   Past Medical History:  Diagnosis Date  . Diabetes mellitus without complication ([place]   . Headache(784.0)   . History of anemia   . Hypertension        Review of Systems  Constitutional: Negative for activity change, appetite change and fatigue.  Respiratory: Negative for cough, chest tightness and shortness of breath.   Cardiovascular: Negative for chest pain.  Endocrine: Negative for polydipsia, polyphagia and polyuria.       Objective:   Physical Exam Vitals signs reviewed.  Constitutional:      General: She is not in acute distress.    Appearance: She is not  ill-appearing.  HENT:     Head: Normocephalic and atraumatic.     Right Ear: Tympanic membrane, ear canal and external ear normal. There is no impacted cerumen.     Left Ear: Tympanic membrane, ear canal and external ear normal. There is no impacted cerumen.     Nose: Nose normal. No congestion or rhinorrhea.     Mouth/Throat:     Mouth: Mucous membranes are moist.     Pharynx: Oropharynx is clear. No oropharyngeal exudate or posterior oropharyngeal erythema.  Eyes:     General: No scleral icterus.       Right eye: No discharge.        Left eye: No discharge.     Extraocular Movements: Extraocular movements intact.     Conjunctiva/sclera: Conjunctivae normal.     Pupils: Pupils are equal, round, and reactive to light.  Neck:     Musculoskeletal: Normal range of motion and neck supple. No neck rigidity or muscular tenderness.  Cardiovascular:     Rate and Rhythm: Normal rate.     Pulses: Normal pulses.     Heart sounds: Normal heart sounds.  Pulmonary:     Effort: Pulmonary effort is normal. No respiratory distress.     Breath sounds: Normal breath sounds. No wheezing, rhonchi or rales.  Abdominal:     General: There is no distension.  Lymphadenopathy:     Cervical: No cervical adenopathy.  Skin:    General: Skin is  warm and dry.     Capillary Refill: Capillary refill takes less than 2 seconds.     Coloration: Skin is not jaundiced.     Findings: No bruising or rash.  Neurological:     Mental Status: She is alert.     Coordination: Coordination normal.     Gait: Gait normal.  Psychiatric:        Mood and Affect: Mood normal.        Behavior: Behavior normal.        Thought Content: Thought content normal.           Assessment & Plan:  Type 2 Diabetes mellitus -Continue Metformin -Start Actos 68m PO QD. A1c 7.2 today. Discussed extensively with patient goals of A1c and importance of managing this condition. -on ACE. Increased today.  -on statin.  -up to date eye  exam and foot exam.  -vaccines up to date.    Essential hypertension -Continue Lisinopril -Start HCTZ 12.5 mg PO QD due to persistently increased BP readings.   Dyslipidemia -Check lipid panel -continue lipitor  Follow up in 3 months.   30 minutes and More than 50% of visit was spent counseling patient on plan/medication adherence to prevent further complications of current diagnoses.   .Marland KitchenVernetta HoneyPA-C, have reviewed and agree with the above documentation in it's entirety.

## 2018-11-29 NOTE — Patient Instructions (Signed)
Start back on HCTZ and Actos.

## 2018-12-23 ENCOUNTER — Telehealth: Payer: Self-pay | Admitting: Physician Assistant

## 2018-12-23 DIAGNOSIS — I10 Essential (primary) hypertension: Secondary | ICD-10-CM

## 2018-12-23 NOTE — Telephone Encounter (Signed)
Pt called and stated she takes lisinopril twice a day, needs new RX written so she can get filled at pharmacy, RX still has old instructions so they will not fill.

## 2018-12-24 ENCOUNTER — Other Ambulatory Visit: Payer: Self-pay | Admitting: Physician Assistant

## 2018-12-24 DIAGNOSIS — Z1231 Encounter for screening mammogram for malignant neoplasm of breast: Secondary | ICD-10-CM

## 2018-12-24 MED ORDER — LISINOPRIL 40 MG PO TABS: 40.0000 mg | ORAL_TABLET | Freq: Two times a day (BID) | ORAL | 1 refills | Status: DC

## 2018-12-24 NOTE — Telephone Encounter (Signed)
Done

## 2018-12-24 NOTE — Addendum Note (Signed)
Addended by: Jomarie Longs on: 12/24/2018 12:30 PM   Modules accepted: Orders

## 2019-02-12 ENCOUNTER — Ambulatory Visit: Payer: BLUE CROSS/BLUE SHIELD

## 2019-02-25 ENCOUNTER — Other Ambulatory Visit: Payer: Self-pay | Admitting: Physician Assistant

## 2019-02-25 DIAGNOSIS — E119 Type 2 diabetes mellitus without complications: Secondary | ICD-10-CM

## 2019-02-28 ENCOUNTER — Telehealth (INDEPENDENT_AMBULATORY_CARE_PROVIDER_SITE_OTHER): Payer: BLUE CROSS/BLUE SHIELD | Admitting: Physician Assistant

## 2019-02-28 VITALS — BP 144/85 | HR 81 | Ht 69.0 in | Wt 210.0 lb

## 2019-02-28 DIAGNOSIS — E119 Type 2 diabetes mellitus without complications: Secondary | ICD-10-CM

## 2019-02-28 DIAGNOSIS — F329 Major depressive disorder, single episode, unspecified: Secondary | ICD-10-CM | POA: Diagnosis not present

## 2019-02-28 DIAGNOSIS — E782 Mixed hyperlipidemia: Secondary | ICD-10-CM

## 2019-02-28 DIAGNOSIS — N951 Menopausal and female climacteric states: Secondary | ICD-10-CM | POA: Diagnosis not present

## 2019-02-28 DIAGNOSIS — F32A Depression, unspecified: Secondary | ICD-10-CM

## 2019-02-28 DIAGNOSIS — I10 Essential (primary) hypertension: Secondary | ICD-10-CM | POA: Diagnosis not present

## 2019-02-28 MED ORDER — HYDROCHLOROTHIAZIDE 25 MG PO TABS: ORAL_TABLET | ORAL | 1 refills | Status: DC

## 2019-02-28 MED ORDER — PAROXETINE HCL 40 MG PO TABS: ORAL_TABLET | ORAL | 1 refills | Status: DC

## 2019-02-28 MED ORDER — METFORMIN HCL 1000 MG PO TABS: 1000.0000 mg | ORAL_TABLET | Freq: Two times a day (BID) | ORAL | 1 refills | Status: DC

## 2019-02-28 MED ORDER — ATORVASTATIN CALCIUM 10 MG PO TABS: 10.0000 mg | ORAL_TABLET | Freq: Every day | ORAL | 1 refills | Status: DC

## 2019-02-28 MED ORDER — LISINOPRIL 40 MG PO TABS: 40.0000 mg | ORAL_TABLET | Freq: Two times a day (BID) | ORAL | 1 refills | Status: DC

## 2019-02-28 MED ORDER — EST ESTROGENS-METHYLTEST 1.25-2.5 MG PO TABS: 1.0000 | ORAL_TABLET | Freq: Every day | ORAL | 1 refills | Status: DC

## 2019-02-28 MED ORDER — AMLODIPINE BESYLATE 10 MG PO TABS: 10.0000 mg | ORAL_TABLET | Freq: Every day | ORAL | 1 refills | Status: DC

## 2019-02-28 NOTE — Progress Notes (Signed)
Doing well. PHQ-9/GAD7 completed. Due for HgA1C. Needs refills.

## 2019-02-28 NOTE — Progress Notes (Signed)
Patient ID: Kayla Gallagher, female   DOB: Oct 10, 1951, 68 y.o.   MRN: [id number] .Marland KitchenVirtual Visit via Telephone Note  I connected with Kayla Gallagher on 03/04/19 at  9:50 AM EDT by telephone and verified that I am speaking with the correct person using two identifiers.   I discussed the limitations, risks, security and privacy concerns of performing an evaluation and management service by telephone and the availability of in person appointments. I also discussed with the patient that there may be a patient responsible charge related to this service. The patient expressed understanding and agreed to proceed.   History of Present Illness: Pt is a 68 yo female with T2DM, HTN, HLD, MDD who calls in for 3 month  follow up and refills.   She is doing really well. No problems or concerns. She is not checking her sugars but admits to taking metformin every day. No open sores or wound. No hypoglycemia.   Her mood is doing great. No concerns or complaints.   On HRT pts menopausal symptoms are controlled.   No CP, palpitations, headaches or vision changes.   .. Active Ambulatory Problems    Diagnosis Date Noted  . History of hepatitis C 01/07/2013  . Essential hypertension, benign 01/07/2013  . Hyperlipidemia 01/07/2013  . History of breast cancer 01/07/2013  . Depression 01/07/2013  . 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  . Hyperplastic colon polyp 01/22/2013  . Type 2 diabetes mellitus ([place]) 02/21/2013  . Breast mass, right 02/21/2013  . Actinic keratosis 04/16/2013  . Temporal arteritis ([place]) 06/18/2013  . Anxiety state 05/04/2015  . Obesity 09/09/2015  . DDD (degenerative disc disease), cervical 06/22/2017  . Acute abdominal pain in left lower quadrant 06/22/2017  . Elevated serum creatinine 01/29/2018  . Menopausal symptoms 01/29/2018   Resolved Ambulatory Problems    Diagnosis Date Noted  . Strain of elbow, left 06/01/2014  . Community acquired pneumonia of right middle lobe of lung ([place])  12/06/2017   Past Medical History:  Diagnosis Date  . Diabetes mellitus without complication ([place])   . Headache(784.0)   . History of anemia   . Hypertension        Observations/Objective: No acute distress.  No labored breathing.  Normal HR.   .. Today's Vitals   02/28/19 0949  BP: (!) 144/85  Pulse: 81  Weight: 210 lb (95.3 kg)  Height: '5\' 9"'  (1.753 m)   Body mass index is 31.01 kg/m.  .. Depression screen [facility] 2/9 02/28/2019 11/29/2018 01/29/2018 11/02/2017 06/22/2017  Decreased Interest 0 0 0 0 0  Down, Depressed, Hopeless 0 0 0 0 0  PHQ - 2 Score 0 0 0 0 0  Altered sleeping 0 - 1 - -  Tired, decreased energy 0 - 0 - -  Change in appetite 0 - 1 - -  Feeling bad or failure about yourself  0 - 0 - -  Trouble concentrating 0 - 0 - -  Moving slowly or fidgety/restless 0 - 0 - -  Suicidal thoughts 0 - 0 - -  PHQ-9 Score 0 - 2 - -  Difficult doing work/chores Not difficult at all - Not difficult at all - -   .Marland Kitchen GAD 7 : Generalized Anxiety Score 02/28/2019 11/29/2018 01/29/2018  Nervous, Anxious, on Edge 0 1 0  Control/stop worrying 0 0 0  Worry too much - different things 0 0 0  Trouble relaxing 0 0 0  Restless 0 0 1  Easily annoyed or irritable 0 0  0  Afraid - awful might happen 0 0 0  Total GAD 7 Score 0 1 1  Anxiety Difficulty Not difficult at all Not difficult at all Not difficult at all     Assessment and Plan: [name] KitchenAshaya was seen today for diabetes.  Diagnoses and all orders for this visit:  Type 2 diabetes mellitus without complication, without long-term current use of insulin (HCC) -     metFORMIN (GLUCOPHAGE) 1000 MG tablet; Take 1 tablet (1,000 mg total) by mouth 2 (two) times daily with a meal. -     Hemoglobin A1c -     COMPLETE METABOLIC PANEL WITH GFR  Depression, unspecified depression type -     PARoxetine (PAXIL) 40 MG tablet; TAKE 1 TABLET BY MOUTH EVERY DAY IN THE MORNING  Essential hypertension, benign -     lisinopril (ZESTRIL) 40 MG  tablet; Take 1 tablet (40 mg total) by mouth 2 (two) times daily. -     hydrochlorothiazide (HYDRODIURIL) 25 MG tablet; Take one tablet daily for blood pressure control. -     amLODipine (NORVASC) 10 MG tablet; Take 1 tablet (10 mg total) by mouth daily. -     COMPLETE METABOLIC PANEL WITH GFR  Menopausal symptoms -     estrogens-methylTEST (ESTRATEST) 1.25-2.5 MG tablet; Take 1 tablet by mouth daily.  Mixed hyperlipidemia -     atorvastatin (LIPITOR) 10 MG tablet; Take 1 tablet (10 mg total) by mouth daily. -     Lipid Panel w/reflex Direct LDL  .Marland Kitchen Lab Results  Component Value Date   HGBA1C 7.2 (A) 11/29/2018   Last a1c was not to goal. Ordered a1c with other fasting labs to be tested.  Refilled medications.  On ACE. On STATIN. BP not controlled today. Will continue to monitor. If continues to be elevated need to change medications. Pt reports not taken medication yet this morning.  Eye exam up to date.  Flu and pneumonia shot up to date.   Follow Up Instructions:    I discussed the assessment and treatment plan with the patient. The patient was provided an opportunity to ask questions and all were answered. The patient agreed with the plan and demonstrated an understanding of the instructions.   The patient was advised to call back or seek an in-person evaluation if the symptoms worsen or if the condition fails to improve as anticipated.  I provided 25 minutes of non-face-to-face time during this encounter.   Iran Planas, PA-C

## 2019-03-04 ENCOUNTER — Encounter: Payer: Self-pay | Admitting: Physician Assistant

## 2019-03-04 DIAGNOSIS — E782 Mixed hyperlipidemia: Secondary | ICD-10-CM | POA: Diagnosis not present

## 2019-03-04 DIAGNOSIS — E119 Type 2 diabetes mellitus without complications: Secondary | ICD-10-CM | POA: Diagnosis not present

## 2019-03-04 DIAGNOSIS — I10 Essential (primary) hypertension: Secondary | ICD-10-CM | POA: Diagnosis not present

## 2019-03-05 LAB — COMPLETE METABOLIC PANEL WITH GFR
AG Ratio: 1.6 (calc) (ref 1.0–2.5)
ALT: 15 U/L (ref 6–29)
AST: 15 U/L (ref 10–35)
Albumin: 4.6 g/dL (ref 3.6–5.1)
Alkaline phosphatase (APISO): 60 U/L (ref 37–153)
BUN/Creatinine Ratio: 21 (calc) (ref 6–22)
BUN: 29 mg/dL — ABNORMAL HIGH (ref 7–25)
CO2: 29 mmol/L (ref 20–32)
Calcium: 10.3 mg/dL (ref 8.6–10.4)
Chloride: 100 mmol/L (ref 98–110)
Creat: 1.41 mg/dL — ABNORMAL HIGH (ref 0.50–0.99)
GFR, Est African American: 44 mL/min/{1.73_m2} — ABNORMAL LOW (ref >=60)
GFR, Est Non African American: 38 mL/min/{1.73_m2} — ABNORMAL LOW (ref >=60)
Globulin: 2.8 g/dL (calc) (ref 1.9–3.7)
Glucose, Bld: 162 mg/dL — ABNORMAL HIGH (ref 65–99)
Potassium: 4.8 mmol/L (ref 3.5–5.3)
Sodium: 135 mmol/L (ref 135–146)
Total Bilirubin: 0.4 mg/dL (ref 0.2–1.2)
Total Protein: 7.4 g/dL (ref 6.1–8.1)

## 2019-03-05 LAB — HEMOGLOBIN A1C
Hgb A1c MFr Bld: 7.5 % of total Hgb — ABNORMAL HIGH (ref <5.7)
Mean Plasma Glucose: 169 (calc)
eAG (mmol/L): 9.3 (calc)

## 2019-03-05 LAB — LIPID PANEL W/REFLEX DIRECT LDL
Cholesterol: 125 mg/dL (ref <200)
HDL: 30 mg/dL — ABNORMAL LOW (ref >=50)
LDL Cholesterol (Calc): 66 mg/dL (calc)
Non-HDL Cholesterol (Calc): 95 mg/dL (calc) (ref <130)
Total CHOL/HDL Ratio: 4.2 (calc) (ref <5.0)
Triglycerides: 230 mg/dL — ABNORMAL HIGH (ref <150)

## 2019-03-05 MED ORDER — SITAGLIPTIN PHOSPHATE 100 MG PO TABS: 100.0000 mg | ORAL_TABLET | Freq: Every day | ORAL | 11 refills | Status: DC

## 2019-03-05 NOTE — Progress Notes (Signed)
True is already urinating a lot.   I will send over different class but still oral pill once a day januvia.   There is a once weekly injectable with great sugar lowering results that can also cause weight loss if interested as well.

## 2019-03-05 NOTE — Addendum Note (Signed)
Addended by: Jomarie Longs on: 03/05/2019 11:31 AM   Modules accepted: Orders

## 2019-03-05 NOTE — Progress Notes (Signed)
A!C up to 7.5. TG are up likely because sugar is up.  Creatine up a little bit too.  LDL to goal.   I would like to add a medication to help get a1c under 7.0. we could combine metformin with another medication that would still be one pill. The other pill helps you to urinate off 300 g of sugar a day. What are your thoughts?

## 2019-03-27 ENCOUNTER — Ambulatory Visit (INDEPENDENT_AMBULATORY_CARE_PROVIDER_SITE_OTHER): Payer: BLUE CROSS/BLUE SHIELD

## 2019-03-27 ENCOUNTER — Other Ambulatory Visit: Payer: Self-pay

## 2019-03-27 DIAGNOSIS — Z1231 Encounter for screening mammogram for malignant neoplasm of breast: Secondary | ICD-10-CM | POA: Diagnosis not present

## 2019-03-28 NOTE — Progress Notes (Signed)
Normal mammogram. Follow up in one year.

## 2019-05-15 ENCOUNTER — Encounter: Payer: Self-pay | Admitting: Osteopathic Medicine

## 2019-05-15 ENCOUNTER — Ambulatory Visit (INDEPENDENT_AMBULATORY_CARE_PROVIDER_SITE_OTHER): Payer: BC Managed Care – PPO | Admitting: Osteopathic Medicine

## 2019-05-15 ENCOUNTER — Telehealth: Payer: Self-pay | Admitting: Neurology

## 2019-05-15 VITALS — BP 116/76 | HR 100

## 2019-05-15 DIAGNOSIS — J019 Acute sinusitis, unspecified: Secondary | ICD-10-CM | POA: Diagnosis not present

## 2019-05-15 MED ORDER — AMOXICILLIN-POT CLAVULANATE 875-125 MG PO TABS: 1.0000 | ORAL_TABLET | Freq: Two times a day (BID) | ORAL | 0 refills | Status: AC

## 2019-05-15 MED ORDER — IPRATROPIUM BROMIDE 0.06 % NA SOLN: 2.0000 | Freq: Four times a day (QID) | NASAL | 1 refills | Status: DC

## 2019-05-15 NOTE — Progress Notes (Signed)
Virtual Visit via Video (App used: Doximity) Note  I connected with      Kayla Gallagher on 05/15/19 at 11:58 AM by a telemedicine application and verified that I am speaking with the correct person using two identifiers.  Patient is at home I am in office   I discussed the limitations of evaluation and management by telemedicine and the availability of in person appointments. The patient expressed understanding and agreed to proceed.  History of Present Illness: Kayla Gallagher is a 68 y.o. female who would like to discuss sinus problems    Ongoing for about a week. Sinus pain/pressure. Has tried Neti-pot and Claritin. This happens maybe once per year. Has allergies but doesn't take meds for this.     Observations/Objective: BP 116/76 (BP Location: Left Arm, Patient Position: Sitting, Cuff Size: Normal)   Pulse 100  BP Readings from Last 3 Encounters:  05/15/19 116/76  02/28/19 (!) 144/85  11/29/18 (!) 147/72   Exam: Normal Speech.  NAD  Lab and Radiology Results No results found for this or any previous visit (from the past 72 hour(s)). No results found.     Assessment and Plan: 68 y.o. female with The encounter diagnosis was Acute non-recurrent sinusitis, unspecified location.  Advised antihistamines +/- steroid nasal spray for seasonal allergies   PDMP not reviewed this encounter. No orders of the defined types were placed in this encounter.  Meds ordered this encounter  Medications  . amoxicillin-clavulanate (AUGMENTIN) 875-125 MG tablet    Sig: Take 1 tablet by mouth 2 (two) times daily for 10 days.    Dispense:  20 tablet    Refill:  0  . ipratropium (ATROVENT) 0.06 % nasal spray    Sig: Place 2 sprays into both nostrils 4 (four) times daily.    Dispense:  15 mL    Refill:  1   There are no Patient Instructions on file for this visit.  Instructions sent via MyChart. If MyChart not available, pt was given option for info via personal e-mail w/ no  guarantee of protected health info over unsecured e-mail communication, and MyChart sign-up instructions were included.   Follow Up Instructions: Return if symptoms worsen or fail to improve.    I discussed the assessment and treatment plan with the patient. The patient was provided an opportunity to ask questions and all were answered. The patient agreed with the plan and demonstrated an understanding of the instructions.   The patient was advised to call back or seek an in-person evaluation if any new concerns, if symptoms worsen or if the condition fails to improve as anticipated.  15 minutes of non-face-to-face time was provided during this encounter.                      Historical information moved to improve visibility of documentation.  Past Medical History:  Diagnosis Date  . Depression   . Diabetes mellitus without complication (HCC)   . Essential hypertension, benign 01/07/2013  . Headache(784.0)   . History of anemia   . History of hepatitis C 01/07/2013  . Hyperlipidemia   . Hypertension    Does not see cardiology   Past Surgical History:  Procedure Laterality Date  . ABDOMINAL HYSTERECTOMY  1976  . ABDOMINAL HYSTERECTOMY    . ARTERY BIOPSY Right 06/30/2013   Procedure: BIOPSY TEMPORAL ARTERY;  Surgeon: Larina Earthlyodd F Early, MD;  [facility];  Service: Vascular;  Laterality: Right;  .  BREAST BIOPSY Right   . BREAST REDUCTION SURGERY    . BUNIONECTOMY    . REDUCTION MAMMAPLASTY    . tummy tuck     Social History   Tobacco Use  . Smoking status: Never Smoker  . Smokeless tobacco: Never Used  Substance Use Topics  . Alcohol use: No   family history includes Alcoholism in an other family member; Breast cancer in her sister; Cancer in her father and mother; Diabetes in her father; Heart attack in her father; Heart disease in her father; Hyperlipidemia in her father and another family member; Hypertension in her father; Prostate cancer in an other  family member.  Medications: Current Outpatient Medications  Medication Sig Dispense Refill  . ALPRAZolam (XANAX) 0.5 MG tablet Take 1 tablet (0.5 mg total) by mouth at bedtime as needed for anxiety. 30 tablet 2  . amLODipine (NORVASC) 10 MG tablet Take 1 tablet (10 mg total) by mouth daily. 90 tablet 1  . atorvastatin (LIPITOR) 10 MG tablet Take 1 tablet (10 mg total) by mouth daily. 90 tablet 1  . estrogens-methylTEST (ESTRATEST) 1.25-2.5 MG tablet Take 1 tablet by mouth daily. 90 tablet 1  . hydrochlorothiazide (HYDRODIURIL) 25 MG tablet Take one tablet daily for blood pressure control. 90 tablet 1  . lisinopril (ZESTRIL) 40 MG tablet Take 1 tablet (40 mg total) by mouth 2 (two) times daily. 180 tablet 1  . metFORMIN (GLUCOPHAGE) 1000 MG tablet Take 1 tablet (1,000 mg total) by mouth 2 (two) times daily with a meal. 180 tablet 1  . Multiple Vitamins-Minerals (MULTIVITAMIN PO) Take 1 tablet by mouth daily.    Marland Kitchen PARoxetine (PAXIL) 40 MG tablet TAKE 1 TABLET BY MOUTH EVERY DAY IN THE MORNING 90 tablet 1  . amoxicillin-clavulanate (AUGMENTIN) 875-125 MG tablet Take 1 tablet by mouth 2 (two) times daily for 10 days. 20 tablet 0  . ipratropium (ATROVENT) 0.06 % nasal spray Place 2 sprays into both nostrils 4 (four) times daily. 15 mL 1  . sitaGLIPtin (JANUVIA) 100 MG tablet Take 1 tablet (100 mg total) by mouth daily. (Patient not taking: Reported on 05/15/2019) 30 tablet 11   No current facility-administered medications for this visit.    Allergies  Allergen Reactions  . Morphine And Related Nausea And Vomiting    PDMP not reviewed this encounter. No orders of the defined types were placed in this encounter.  Meds ordered this encounter  Medications  . amoxicillin-clavulanate (AUGMENTIN) 875-125 MG tablet    Sig: Take 1 tablet by mouth 2 (two) times daily for 10 days.    Dispense:  20 tablet    Refill:  0  . ipratropium (ATROVENT) 0.06 % nasal spray    Sig: Place 2 sprays into both  nostrils 4 (four) times daily.    Dispense:  15 mL    Refill:  1

## 2019-05-15 NOTE — Telephone Encounter (Signed)
Patient left vm that she has sinusitis and requesting medication.   Janett Billow - can you call patient [phone] and schedule with any available provider for a virtual visit to discuss? Thanks.

## 2019-06-04 ENCOUNTER — Other Ambulatory Visit: Payer: Self-pay | Admitting: Osteopathic Medicine

## 2019-06-08 ENCOUNTER — Other Ambulatory Visit: Payer: Self-pay | Admitting: Physician Assistant

## 2019-06-08 DIAGNOSIS — I10 Essential (primary) hypertension: Secondary | ICD-10-CM

## 2019-08-26 ENCOUNTER — Other Ambulatory Visit: Payer: Self-pay | Admitting: Physician Assistant

## 2019-08-26 DIAGNOSIS — E119 Type 2 diabetes mellitus without complications: Secondary | ICD-10-CM

## 2019-09-05 ENCOUNTER — Other Ambulatory Visit: Payer: Self-pay | Admitting: Physician Assistant

## 2019-09-05 DIAGNOSIS — I10 Essential (primary) hypertension: Secondary | ICD-10-CM

## 2019-09-11 ENCOUNTER — Other Ambulatory Visit: Payer: Self-pay | Admitting: Physician Assistant

## 2019-09-11 DIAGNOSIS — N951 Menopausal and female climacteric states: Secondary | ICD-10-CM

## 2019-10-06 ENCOUNTER — Other Ambulatory Visit: Payer: Self-pay | Admitting: Physician Assistant

## 2019-10-06 DIAGNOSIS — N951 Menopausal and female climacteric states: Secondary | ICD-10-CM

## 2019-10-13 ENCOUNTER — Other Ambulatory Visit: Payer: Self-pay

## 2019-10-13 DIAGNOSIS — E782 Mixed hyperlipidemia: Secondary | ICD-10-CM

## 2019-10-13 MED ORDER — ATORVASTATIN CALCIUM 10 MG PO TABS: 10.0000 mg | ORAL_TABLET | Freq: Every day | ORAL | 2 refills | Status: DC

## 2019-10-16 ENCOUNTER — Other Ambulatory Visit: Payer: Self-pay | Admitting: Physician Assistant

## 2019-10-16 DIAGNOSIS — N951 Menopausal and female climacteric states: Secondary | ICD-10-CM

## 2019-10-20 ENCOUNTER — Encounter: Payer: Self-pay | Admitting: Physician Assistant

## 2019-10-20 ENCOUNTER — Ambulatory Visit (INDEPENDENT_AMBULATORY_CARE_PROVIDER_SITE_OTHER): Payer: BC Managed Care – PPO | Admitting: Physician Assistant

## 2019-10-20 VITALS — BP 164/93 | HR 84 | Ht 69.0 in | Wt 210.0 lb

## 2019-10-20 DIAGNOSIS — N951 Menopausal and female climacteric states: Secondary | ICD-10-CM | POA: Diagnosis not present

## 2019-10-20 DIAGNOSIS — E119 Type 2 diabetes mellitus without complications: Secondary | ICD-10-CM

## 2019-10-20 DIAGNOSIS — F32A Depression, unspecified: Secondary | ICD-10-CM

## 2019-10-20 DIAGNOSIS — F3342 Major depressive disorder, recurrent, in full remission: Secondary | ICD-10-CM

## 2019-10-20 DIAGNOSIS — F329 Major depressive disorder, single episode, unspecified: Secondary | ICD-10-CM

## 2019-10-20 DIAGNOSIS — I10 Essential (primary) hypertension: Secondary | ICD-10-CM

## 2019-10-20 MED ORDER — AMLODIPINE BESYLATE 10 MG PO TABS: 10.0000 mg | ORAL_TABLET | Freq: Every day | ORAL | 1 refills | Status: DC

## 2019-10-20 MED ORDER — PAROXETINE HCL 40 MG PO TABS: ORAL_TABLET | ORAL | 1 refills | Status: DC

## 2019-10-20 MED ORDER — METFORMIN HCL 1000 MG PO TABS: 1000.0000 mg | ORAL_TABLET | Freq: Two times a day (BID) | ORAL | 0 refills | Status: DC

## 2019-10-20 MED ORDER — HYDROCHLOROTHIAZIDE 25 MG PO TABS: ORAL_TABLET | ORAL | 1 refills | Status: DC

## 2019-10-20 MED ORDER — EST ESTROGENS-METHYLTEST 1.25-2.5 MG PO TABS: 1.0000 | ORAL_TABLET | Freq: Every day | ORAL | 1 refills | Status: DC

## 2019-10-20 MED ORDER — LISINOPRIL 40 MG PO TABS: 40.0000 mg | ORAL_TABLET | Freq: Every day | ORAL | 1 refills | Status: DC

## 2019-10-20 NOTE — Progress Notes (Signed)
Needs refills of medications.  PHQ9-GAD7 completed.

## 2019-10-20 NOTE — Progress Notes (Signed)
Patient ID: Kayla Gallagher, female   DOB: 1950-12-07, 68 y.o.   MRN: [id number] .Marland KitchenVirtual Visit via Video Note  I connected with Kayla Gallagher on 10/20/19 at 10:30 AM EST by a video enabled telemedicine application and verified that I am speaking with the correct person using two identifiers.  Location: Patient: home Provider:clinic   I discussed the limitations of evaluation and management by telemedicine and the availability of in person appointments. The patient expressed understanding and agreed to proceed.  History of Present Illness: Pt is a 68 yo female with T2DM, HTN, menopausal symptoms who calls into the clinic for refills.   DM- never started [place]. Continued on metformin.  She has started back up on diet and exercise.  She is hoping to avoid any more medications.  She does not check her sugars regularly.  She denies any hypoglycemic events.  She denies any open sores or wounds.  She is aware she needs her eye exam.  Patient admits she is been out of 2 of her blood pressure medications.  She has been checking her blood pressure and been elevated.  She denies any chest pain, shortness of breath, headache, vision changes.   She needs refills on her hormone replacement.  This is working very effectively with her hot flashes and mood.  She is also on Paxil.  She feels like this is a great combination.  She had a normal mammogram in May.  She is aware of the side effects and risk of hormone replacement.  .. Active Ambulatory Problems    Diagnosis Date Noted  . History of hepatitis C 01/07/2013  . Essential hypertension, benign 01/07/2013  . Hyperlipidemia 01/07/2013  . History of breast cancer 01/07/2013  . Depression 01/07/2013  . 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  . Hyperplastic colon polyp 01/22/2013  . Type 2 diabetes mellitus ([place]) 02/21/2013  . Breast mass, right 02/21/2013  . Actinic keratosis 04/16/2013  . Temporal arteritis ([place]) 06/18/2013  . Anxiety state 05/04/2015  .  Obesity 09/09/2015  . DDD (degenerative disc disease), cervical 06/22/2017  . Acute abdominal pain in left lower quadrant 06/22/2017  . Elevated serum creatinine 01/29/2018  . Menopausal symptoms 01/29/2018   Resolved Ambulatory Problems    Diagnosis Date Noted  . Strain of elbow, left 06/01/2014  . Community acquired pneumonia of right middle lobe of lung 12/06/2017   Past Medical History:  Diagnosis Date  . Diabetes mellitus without complication ([place])   . Headache(784.0)   . History of anemia   . Hypertension    Reviewed med, allergy, problem list.     Observations/Objective: No acute distress. Normal mood and appearance.  Normal breathing.   .. Today's Vitals   10/20/19 1020  BP: (!) 164/93  Pulse: 84  Weight: 210 lb (95.3 kg)  Height: '5\' 9"'  (1.753 m)   Body mass index is 31.01 kg/m.  .. Depression screen [facility] 2/9 10/20/2019 02/28/2019 11/29/2018 01/29/2018 11/02/2017  Decreased Interest 0 0 0 0 0  Down, Depressed, Hopeless 1 0 0 0 0  PHQ - 2 Score 1 0 0 0 0  Altered sleeping 0 0 - 1 -  Tired, decreased energy 0 0 - 0 -  Change in appetite 0 0 - 1 -  Feeling bad or failure about yourself  0 0 - 0 -  Trouble concentrating 0 0 - 0 -  Moving slowly or fidgety/restless 0 0 - 0 -  Suicidal thoughts 0 0 - 0 -  PHQ-9 Score 1  0 - 2 -  Difficult doing work/chores Not difficult at all Not difficult at all - Not difficult at all -   .. GAD 7 : Generalized Anxiety Score 10/20/2019 02/28/2019 11/29/2018 01/29/2018  Nervous, Anxious, on Edge 0 0 1 0  Control/stop worrying 0 0 0 0  Worry too much - different things 0 0 0 0  Trouble relaxing 0 0 0 0  Restless 0 0 0 1  Easily annoyed or irritable 1 0 0 0  Afraid - awful might happen 0 0 0 0  Total GAD 7 Score 1 0 1 1  Anxiety Difficulty Not difficult at all Not difficult at all Not difficult at all Not difficult at all       Assessment and Plan: [name] KitchenRosanna was seen today for diabetes.  Diagnoses and all orders for this  visit:  Recurrent major depressive disorder, in full remission ([place])  Essential hypertension, benign -     hydrochlorothiazide (HYDRODIURIL) 25 MG tablet; Take one tablet daily for blood pressure control. -     amLODipine (NORVASC) 10 MG tablet; Take 1 tablet (10 mg total) by mouth daily. -     lisinopril (ZESTRIL) 40 MG tablet; Take 1 tablet (40 mg total) by mouth daily. -     COMPLETE METABOLIC PANEL WITH GFR  Menopausal symptoms -     estrogens-methylTEST (ESTRATEST) 1.25-2.5 MG tablet; Take 1 tablet by mouth daily.  Type 2 diabetes mellitus without complication, without long-term current use of insulin (HCC) -     metFORMIN (GLUCOPHAGE) 1000 MG tablet; Take 1 tablet (1,000 mg total) by mouth 2 (two) times daily with a meal. -     Hemoglobin A1c -     COMPLETE METABOLIC PANEL WITH GFR  Depression, unspecified depression type -     PARoxetine (PAXIL) 40 MG tablet; TAKE 1 TABLET BY MOUTH EVERY DAY IN THE MORNING   Needs a1c today. Ordered to come to lab.  Metformin refilled.  Never started [place].  On statin.  On ACE. BP not to goal.  Needs eye exam.  Flu and pneumonia UTD.   Pt aware of long term side effects and risk of HRT. Mammogram UTD 03/2019 and normal.  On STATIN.    Out of bP medication. Refilled. Keep BP log. Recheck in 3 months.    Follow Up Instructions:    I discussed the assessment and treatment plan with the patient. The patient was provided an opportunity to ask questions and all were answered. The patient agreed with the plan and demonstrated an understanding of the instructions.   The patient was advised to call back or seek an in-person evaluation if the symptoms worsen or if the condition fails to improve as anticipated.   Iran Planas, PA-C

## 2019-12-29 DIAGNOSIS — M67471 Ganglion, right ankle and foot: Secondary | ICD-10-CM | POA: Diagnosis not present

## 2019-12-29 DIAGNOSIS — M19071 Primary osteoarthritis, right ankle and foot: Secondary | ICD-10-CM | POA: Diagnosis not present

## 2020-01-05 DIAGNOSIS — M67471 Ganglion, right ankle and foot: Secondary | ICD-10-CM | POA: Diagnosis not present

## 2020-01-14 DIAGNOSIS — E119 Type 2 diabetes mellitus without complications: Secondary | ICD-10-CM | POA: Diagnosis not present

## 2020-01-14 DIAGNOSIS — I10 Essential (primary) hypertension: Secondary | ICD-10-CM | POA: Diagnosis not present

## 2020-01-15 DIAGNOSIS — M67471 Ganglion, right ankle and foot: Secondary | ICD-10-CM | POA: Diagnosis not present

## 2020-01-15 LAB — COMPLETE METABOLIC PANEL WITH GFR
AG Ratio: 1.5 (calc) (ref 1.0–2.5)
ALT: 17 U/L (ref 6–29)
AST: 18 U/L (ref 10–35)
Albumin: 4.3 g/dL (ref 3.6–5.1)
Alkaline phosphatase (APISO): 61 U/L (ref 37–153)
BUN/Creatinine Ratio: 18 (calc) (ref 6–22)
BUN: 19 mg/dL (ref 7–25)
CO2: 28 mmol/L (ref 20–32)
Calcium: 10 mg/dL (ref 8.6–10.4)
Chloride: 101 mmol/L (ref 98–110)
Creat: 1.03 mg/dL — ABNORMAL HIGH (ref 0.50–0.99)
GFR, Est African American: 64 mL/min/{1.73_m2} (ref >=60)
GFR, Est Non African American: 55 mL/min/{1.73_m2} — ABNORMAL LOW (ref >=60)
Globulin: 2.8 g/dL (calc) (ref 1.9–3.7)
Glucose, Bld: 180 mg/dL — ABNORMAL HIGH (ref 65–99)
Potassium: 4.8 mmol/L (ref 3.5–5.3)
Sodium: 137 mmol/L (ref 135–146)
Total Bilirubin: 0.4 mg/dL (ref 0.2–1.2)
Total Protein: 7.1 g/dL (ref 6.1–8.1)

## 2020-01-15 LAB — HEMOGLOBIN A1C
Hgb A1c MFr Bld: 8.4 % of total Hgb — ABNORMAL HIGH (ref <5.7)
Mean Plasma Glucose: 194 (calc)
eAG (mmol/L): 10.8 (calc)

## 2020-01-16 NOTE — Progress Notes (Signed)
Kayla Gallagher,   Due for follow up please schedule. We need to talk about your sugars. A1C is 8.4.  Good news kidney function has improved.

## 2020-01-20 ENCOUNTER — Encounter: Payer: Self-pay | Admitting: Physician Assistant

## 2020-01-20 ENCOUNTER — Ambulatory Visit (INDEPENDENT_AMBULATORY_CARE_PROVIDER_SITE_OTHER): Payer: BC Managed Care – PPO | Admitting: Physician Assistant

## 2020-01-20 ENCOUNTER — Other Ambulatory Visit: Payer: Self-pay

## 2020-01-20 VITALS — BP 155/74 | HR 89 | Ht 69.0 in | Wt 207.0 lb

## 2020-01-20 DIAGNOSIS — F32A Depression, unspecified: Secondary | ICD-10-CM

## 2020-01-20 DIAGNOSIS — E119 Type 2 diabetes mellitus without complications: Secondary | ICD-10-CM | POA: Diagnosis not present

## 2020-01-20 DIAGNOSIS — M84374S Stress fracture, right foot, sequela: Secondary | ICD-10-CM

## 2020-01-20 DIAGNOSIS — I10 Essential (primary) hypertension: Secondary | ICD-10-CM | POA: Diagnosis not present

## 2020-01-20 DIAGNOSIS — F329 Major depressive disorder, single episode, unspecified: Secondary | ICD-10-CM

## 2020-01-20 DIAGNOSIS — F410 Panic disorder [episodic paroxysmal anxiety] without agoraphobia: Secondary | ICD-10-CM

## 2020-01-20 MED ORDER — ALPRAZOLAM 0.5 MG PO TABS: 0.5000 mg | ORAL_TABLET | Freq: Every evening | ORAL | 2 refills | Status: DC | PRN

## 2020-01-20 MED ORDER — JANUVIA 100 MG PO TABS: 100.0000 mg | ORAL_TABLET | Freq: Every day | ORAL | 0 refills | Status: DC

## 2020-01-20 MED ORDER — METFORMIN HCL 1000 MG PO TABS: 1000.0000 mg | ORAL_TABLET | Freq: Two times a day (BID) | ORAL | 0 refills | Status: DC

## 2020-01-20 MED ORDER — PAROXETINE HCL 40 MG PO TABS: ORAL_TABLET | ORAL | 3 refills | Status: DC

## 2020-01-20 NOTE — Progress Notes (Signed)
Subjective:    Patient ID: [name], female    DOB: 1951-07-28, 68 y.o.   MRN: [id number]  HPI  Pt is a 69 yo female with T2DM, HTN who presents to the clinic for follow up.   She is doing ok. She admits to not eating well and not exercising. She has not been taking [place]. She is taking norvasc, HCTZ, lisinopril, metformin and lipitor daily. No CP, palpitations, headaches or vision changes. No checking sugars.   She did have stress fracuture of right foot and had a few cortisone injections. She wonders if that could have raised her A1C.     .. Active Ambulatory Problems    Diagnosis Date Noted  . History of hepatitis C 01/07/2013  . Essential hypertension, benign 01/07/2013  . Hyperlipidemia 01/07/2013  . History of breast cancer 01/07/2013  . Depression 01/07/2013  . 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  . Hyperplastic colon polyp 01/22/2013  . Type 2 diabetes mellitus ([place]) 02/21/2013  . Breast mass, right 02/21/2013  . Actinic keratosis 04/16/2013  . Temporal arteritis ([place]) 06/18/2013  . Anxiety state 05/04/2015  . Obesity 09/09/2015  . DDD (degenerative disc disease), cervical 06/22/2017  . Acute abdominal pain in left lower quadrant 06/22/2017  . Elevated serum creatinine 01/29/2018  . Menopausal symptoms 01/29/2018  . Metatarsal stress fracture of right foot 01/26/2020   Resolved Ambulatory Problems    Diagnosis Date Noted  . Strain of elbow, left 06/01/2014  . Community acquired pneumonia of right middle lobe of lung 12/06/2017   Past Medical History:  Diagnosis Date  . Diabetes mellitus without complication ([place])   . Headache(784.0)   . History of anemia   . Hypertension      Review of Systems  All other systems reviewed and are negative.      Objective:   Physical Exam Vitals reviewed.  Constitutional:      Appearance: Normal appearance.  Neck:     Vascular: No carotid bruit.  Cardiovascular:     Rate and Rhythm: Normal rate and regular rhythm.   Pulses: Normal pulses.  Pulmonary:     Effort: Pulmonary effort is normal.     Breath sounds: Normal breath sounds.  Neurological:     Mental Status: She is alert and oriented to person, place, and time.  Psychiatric:        Mood and Affect: Mood normal.    .. Depression screen [facility] 2/9 01/20/2020 10/20/2019 02/28/2019 11/29/2018 01/29/2018  Decreased Interest 0 0 0 0 0  Down, Depressed, Hopeless 0 1 0 0 0  PHQ - 2 Score 0 1 0 0 0  Altered sleeping 0 0 0 - 1  Tired, decreased energy 1 0 0 - 0  Change in appetite 0 0 0 - 1  Feeling bad or failure about yourself  0 0 0 - 0  Trouble concentrating 0 0 0 - 0  Moving slowly or fidgety/restless 0 0 0 - 0  Suicidal thoughts 0 0 0 - 0  PHQ-9 Score 1 1 0 - 2  Difficult doing work/chores - Not difficult at all Not difficult at all - Not difficult at all   .Marland Kitchen GAD 7 : Generalized Anxiety Score 01/20/2020 10/20/2019 02/28/2019 11/29/2018  Nervous, Anxious, on Edge 0 0 0 1  Control/stop worrying 0 0 0 0  Worry too much - different things 0 0 0 0  Trouble relaxing 0 0 0 0  Restless 1 0 0 0  Easily annoyed or irritable  1 1 0 0  Afraid - awful might happen 0 0 0 0  Total GAD 7 Score 2 1 0 1  Anxiety Difficulty - Not difficult at all Not difficult at all Not difficult at all           Assessment & Plan:  [name] KitchenTristy was seen today for diabetes.  Diagnoses and all orders for this visit:  Essential hypertension, benign  Type 2 diabetes mellitus without complication, without long-term current use of insulin (HCC) -     metFORMIN (GLUCOPHAGE) 1000 MG tablet; Take 1 tablet (1,000 mg total) by mouth 2 (two) times daily with a meal. -     JANUVIA 100 MG tablet; Take 1 tablet (100 mg total) by mouth daily.  Depression, unspecified depression type -     Discontinue: ALPRAZolam (XANAX) 0.5 MG tablet; Take 1 tablet (0.5 mg total) by mouth at bedtime as needed for anxiety. -     PARoxetine (PAXIL) 40 MG tablet; TAKE 1 TABLET BY MOUTH EVERY DAY IN THE  MORNING  Stress fracture of metatarsal bone of right foot, sequela  Panic attacks -     ALPRAZolam (XANAX) 0.5 MG tablet; Take 1 tablet (0.5 mg total) by mouth at bedtime as needed for anxiety.   .. Lab Results  Component Value Date   HGBA1C 8.4 (H) 01/14/2020   A1C not to goal.  Continue metformin.  Added [place].  LDL under 70. Continue lipitor.  On ACE/ARB BP not to goal.  Needs eye exam.  Pneumonia and flu shot up to date.  Follow up in 3 months.   Refilled xanax and paxil. Mood controlled.   BP not to goal. At max of HCTZ/norvasc/lisinopril.  Discussed changing lisinopril to diovan. Pt would like to try lifestyle management first.  Discussed DASH diet and exercise.  If not to goal in 3 months will make change.  Discussed risk of BP elevated.   Spent 35 minutes with patient.

## 2020-01-20 NOTE — Patient Instructions (Signed)
DASH Eating Plan DASH stands for "Dietary Approaches to Stop Hypertension." The DASH eating plan is a healthy eating plan that has been shown to reduce high blood pressure (hypertension). It may also reduce your risk for type 2 diabetes, heart disease, and stroke. The DASH eating plan may also help with weight loss. What are tips for following this plan?  General guidelines  Avoid eating more than 2,300 mg (milligrams) of salt (sodium) a day. If you have hypertension, you may need to reduce your sodium intake to 1,500 mg a day.  Limit alcohol intake to no more than 1 drink a day for nonpregnant women and 2 drinks a day for men. One drink equals 12 oz of beer, 5 oz of wine, or 1 oz of hard liquor.  Work with your health care provider to maintain a healthy body weight or to lose weight. Ask what an ideal weight is for you.  Get at least 30 minutes of exercise that causes your heart to beat faster (aerobic exercise) most days of the week. Activities may include walking, swimming, or biking.  Work with your health care provider or diet and nutrition specialist (dietitian) to adjust your eating plan to your individual calorie needs. Reading food labels   Check food labels for the amount of sodium per serving. Choose foods with less than 5 percent of the Daily Value of sodium. Generally, foods with less than 300 mg of sodium per serving fit into this eating plan.  To find whole grains, look for the word "whole" as the first word in the ingredient list. Shopping  Buy products labeled as "low-sodium" or "no salt added."  Buy fresh foods. Avoid canned foods and premade or frozen meals. Cooking  Avoid adding salt when cooking. Use salt-free seasonings or herbs instead of table salt or sea salt. Check with your health care provider or pharmacist before using salt substitutes.  Do not fry foods. Cook foods using healthy methods such as baking, boiling, grilling, and broiling instead.  Cook with  heart-healthy oils, such as olive, canola, soybean, or sunflower oil. Meal planning  Eat a balanced diet that includes: ? 5 or more servings of fruits and vegetables each day. At each meal, try to fill half of your plate with fruits and vegetables. ? Up to 6-8 servings of whole grains each day. ? Less than 6 oz of lean meat, poultry, or fish each day. A 3-oz serving of meat is about the same size as a deck of cards. One egg equals 1 oz. ? 2 servings of low-fat dairy each day. ? A serving of nuts, seeds, or beans 5 times each week. ? Heart-healthy fats. Healthy fats called Omega-3 fatty acids are found in foods such as flaxseeds and coldwater fish, like sardines, salmon, and mackerel.  Limit how much you eat of the following: ? Canned or prepackaged foods. ? Food that is high in trans fat, such as fried foods. ? Food that is high in saturated fat, such as fatty meat. ? Sweets, desserts, sugary drinks, and other foods with added sugar. ? Full-fat dairy products.  Do not salt foods before eating.  Try to eat at least 2 vegetarian meals each week.  Eat more home-cooked food and less restaurant, buffet, and fast food.  When eating at a restaurant, ask that your food be prepared with less salt or no salt, if possible. What foods are recommended? The items listed may not be a complete list. Talk with your dietitian about   what dietary choices are best for you. Grains Whole-grain or whole-wheat bread. Whole-grain or whole-wheat pasta. Brown rice. Oatmeal. Quinoa. Bulgur. Whole-grain and low-sodium cereals. Pita bread. Low-fat, low-sodium crackers. Whole-wheat flour tortillas. Vegetables Fresh or frozen vegetables (raw, steamed, roasted, or grilled). Low-sodium or reduced-sodium tomato and vegetable juice. Low-sodium or reduced-sodium tomato sauce and tomato paste. Low-sodium or reduced-sodium canned vegetables. Fruits All fresh, dried, or frozen fruit. Canned fruit in natural juice (without  added sugar). Meat and other protein foods Skinless chicken or turkey. Ground chicken or turkey. Pork with fat trimmed off. Fish and seafood. Egg whites. Dried beans, peas, or lentils. Unsalted nuts, nut butters, and seeds. Unsalted canned beans. Lean cuts of beef with fat trimmed off. Low-sodium, lean deli meat. Dairy Low-fat (1%) or fat-free (skim) milk. Fat-free, low-fat, or reduced-fat cheeses. Nonfat, low-sodium ricotta or cottage cheese. Low-fat or nonfat yogurt. Low-fat, low-sodium cheese. Fats and oils Soft margarine without trans fats. Vegetable oil. Low-fat, reduced-fat, or light mayonnaise and salad dressings (reduced-sodium). Canola, safflower, olive, soybean, and sunflower oils. Avocado. Seasoning and other foods Herbs. Spices. Seasoning mixes without salt. Unsalted popcorn and pretzels. Fat-free sweets. What foods are not recommended? The items listed may not be a complete list. Talk with your dietitian about what dietary choices are best for you. Grains Baked goods made with fat, such as croissants, muffins, or some breads. Dry pasta or rice meal packs. Vegetables Creamed or fried vegetables. Vegetables in a cheese sauce. Regular canned vegetables (not low-sodium or reduced-sodium). Regular canned tomato sauce and paste (not low-sodium or reduced-sodium). Regular tomato and vegetable juice (not low-sodium or reduced-sodium). Pickles. Olives. Fruits Canned fruit in a light or heavy syrup. Fried fruit. Fruit in cream or butter sauce. Meat and other protein foods Fatty cuts of meat. Ribs. Fried meat. Bacon. Sausage. Bologna and other processed lunch meats. Salami. Fatback. Hotdogs. Bratwurst. Salted nuts and seeds. Canned beans with added salt. Canned or smoked fish. Whole eggs or egg yolks. Chicken or turkey with skin. Dairy Whole or 2% milk, cream, and half-and-half. Whole or full-fat cream cheese. Whole-fat or sweetened yogurt. Full-fat cheese. Nondairy creamers. Whipped toppings.  Processed cheese and cheese spreads. Fats and oils Butter. Stick margarine. Lard. Shortening. Ghee. Bacon fat. Tropical oils, such as coconut, palm kernel, or palm oil. Seasoning and other foods Salted popcorn and pretzels. Onion salt, garlic salt, seasoned salt, table salt, and sea salt. Worcestershire sauce. Tartar sauce. Barbecue sauce. Teriyaki sauce. Soy sauce, including reduced-sodium. Steak sauce. Canned and packaged gravies. Fish sauce. Oyster sauce. Cocktail sauce. Horseradish that you find on the shelf. Ketchup. Mustard. Meat flavorings and tenderizers. Bouillon cubes. Hot sauce and Tabasco sauce. Premade or packaged marinades. Premade or packaged taco seasonings. Relishes. Regular salad dressings. Where to find more information:  National Heart, Lung, and Blood Institute: www.nhlbi.nih.gov  American Heart Association: www.heart.org Summary  The DASH eating plan is a healthy eating plan that has been shown to reduce high blood pressure (hypertension). It may also reduce your risk for type 2 diabetes, heart disease, and stroke.  With the DASH eating plan, you should limit salt (sodium) intake to 2,300 mg a day. If you have hypertension, you may need to reduce your sodium intake to 1,500 mg a day.  When on the DASH eating plan, aim to eat more fresh fruits and vegetables, whole grains, lean proteins, low-fat dairy, and heart-healthy fats.  Work with your health care provider or diet and nutrition specialist (dietitian) to adjust your eating plan to your   individual calorie needs. This information is not intended to replace advice given to you by your health care provider. Make sure you discuss any questions you have with your health care provider. Document Revised: 10/12/2017 Document Reviewed: 10/23/2016 Elsevier Patient Education  2020 [employer] Inc.  

## 2020-01-26 ENCOUNTER — Encounter: Payer: Self-pay | Admitting: Physician Assistant

## 2020-01-26 DIAGNOSIS — M84374A Stress fracture, right foot, initial encounter for fracture: Secondary | ICD-10-CM | POA: Insufficient documentation

## 2020-02-04 LAB — HM DIABETES EYE EXAM

## 2020-03-03 ENCOUNTER — Encounter: Payer: Self-pay | Admitting: Physician Assistant

## 2020-03-13 ENCOUNTER — Other Ambulatory Visit: Payer: Self-pay | Admitting: Physician Assistant

## 2020-03-13 DIAGNOSIS — E119 Type 2 diabetes mellitus without complications: Secondary | ICD-10-CM

## 2020-03-26 ENCOUNTER — Other Ambulatory Visit: Payer: Self-pay | Admitting: Physician Assistant

## 2020-03-26 DIAGNOSIS — I10 Essential (primary) hypertension: Secondary | ICD-10-CM

## 2020-04-19 ENCOUNTER — Other Ambulatory Visit: Payer: Self-pay | Admitting: Physician Assistant

## 2020-04-19 DIAGNOSIS — N951 Menopausal and female climacteric states: Secondary | ICD-10-CM

## 2020-05-04 ENCOUNTER — Other Ambulatory Visit: Payer: Self-pay | Admitting: Physician Assistant

## 2020-05-04 DIAGNOSIS — I10 Essential (primary) hypertension: Secondary | ICD-10-CM

## 2020-06-01 ENCOUNTER — Other Ambulatory Visit: Payer: Self-pay | Admitting: Physician Assistant

## 2020-06-01 DIAGNOSIS — Z1231 Encounter for screening mammogram for malignant neoplasm of breast: Secondary | ICD-10-CM

## 2020-06-02 ENCOUNTER — Other Ambulatory Visit: Payer: Self-pay

## 2020-06-02 ENCOUNTER — Ambulatory Visit (INDEPENDENT_AMBULATORY_CARE_PROVIDER_SITE_OTHER): Payer: BC Managed Care – PPO

## 2020-06-02 DIAGNOSIS — Z1231 Encounter for screening mammogram for malignant neoplasm of breast: Secondary | ICD-10-CM

## 2020-06-04 NOTE — Progress Notes (Signed)
Kayla Gallagher,   Normal mammogram. Follow up in 1 year.

## 2020-06-20 ENCOUNTER — Other Ambulatory Visit: Payer: Self-pay | Admitting: Physician Assistant

## 2020-06-20 DIAGNOSIS — E119 Type 2 diabetes mellitus without complications: Secondary | ICD-10-CM

## 2020-07-05 ENCOUNTER — Other Ambulatory Visit: Payer: Self-pay | Admitting: Physician Assistant

## 2020-07-05 DIAGNOSIS — E782 Mixed hyperlipidemia: Secondary | ICD-10-CM

## 2020-07-05 DIAGNOSIS — I10 Essential (primary) hypertension: Secondary | ICD-10-CM

## 2020-07-14 ENCOUNTER — Other Ambulatory Visit: Payer: Self-pay | Admitting: Physician Assistant

## 2020-07-14 DIAGNOSIS — E119 Type 2 diabetes mellitus without complications: Secondary | ICD-10-CM

## 2020-07-22 ENCOUNTER — Other Ambulatory Visit: Payer: Self-pay | Admitting: Physician Assistant

## 2020-07-22 DIAGNOSIS — E119 Type 2 diabetes mellitus without complications: Secondary | ICD-10-CM

## 2020-07-29 ENCOUNTER — Other Ambulatory Visit: Payer: Self-pay

## 2020-07-29 ENCOUNTER — Emergency Department (INDEPENDENT_AMBULATORY_CARE_PROVIDER_SITE_OTHER)
Admission: EM | Admit: 2020-07-29 | Discharge: 2020-07-29 | Disposition: A | Discharge status: Home / Self Care | Payer: BC Managed Care – PPO | Source: Home / Self Care

## 2020-07-29 DIAGNOSIS — S20212A Contusion of left front wall of thorax, initial encounter: Secondary | ICD-10-CM | POA: Diagnosis not present

## 2020-07-29 DIAGNOSIS — R079 Chest pain, unspecified: Secondary | ICD-10-CM | POA: Diagnosis not present

## 2020-07-29 DIAGNOSIS — R110 Nausea: Secondary | ICD-10-CM | POA: Diagnosis not present

## 2020-07-29 DIAGNOSIS — R0602 Shortness of breath: Secondary | ICD-10-CM

## 2020-07-29 DIAGNOSIS — R7989 Other specified abnormal findings of blood chemistry: Secondary | ICD-10-CM | POA: Diagnosis not present

## 2020-07-29 DIAGNOSIS — R0789 Other chest pain: Secondary | ICD-10-CM | POA: Diagnosis not present

## 2020-07-29 DIAGNOSIS — W06XXXA Fall from bed, initial encounter: Secondary | ICD-10-CM | POA: Diagnosis not present

## 2020-07-29 DIAGNOSIS — G8911 Acute pain due to trauma: Secondary | ICD-10-CM | POA: Diagnosis not present

## 2020-07-29 DIAGNOSIS — R9431 Abnormal electrocardiogram [ECG] [EKG]: Secondary | ICD-10-CM

## 2020-07-29 DIAGNOSIS — M549 Dorsalgia, unspecified: Secondary | ICD-10-CM | POA: Diagnosis not present

## 2020-07-29 DIAGNOSIS — Z79899 Other long term (current) drug therapy: Secondary | ICD-10-CM | POA: Diagnosis not present

## 2020-07-29 DIAGNOSIS — Z885 Allergy status to narcotic agent status: Secondary | ICD-10-CM | POA: Diagnosis not present

## 2020-07-29 DIAGNOSIS — R7981 Abnormal blood-gas level: Secondary | ICD-10-CM

## 2020-07-29 DIAGNOSIS — J9811 Atelectasis: Secondary | ICD-10-CM | POA: Diagnosis not present

## 2020-07-29 DIAGNOSIS — M5136 Other intervertebral disc degeneration, lumbar region: Secondary | ICD-10-CM | POA: Diagnosis not present

## 2020-07-29 DIAGNOSIS — Z7984 Long term (current) use of oral hypoglycemic drugs: Secondary | ICD-10-CM | POA: Diagnosis not present

## 2020-07-29 DIAGNOSIS — J90 Pleural effusion, not elsewhere classified: Secondary | ICD-10-CM | POA: Diagnosis not present

## 2020-07-29 MED ORDER — ASPIRIN 325 MG PO TABS
324.0000 mg | ORAL_TABLET | Freq: Once | ORAL | Status: AC
  Administered 2020-07-29: 324 mg via ORAL

## 2020-07-29 NOTE — Discharge Instructions (Signed)
  You have declined EMS transport but it is still advised you have your son-in-law drive you directly to the emergency department for further evaluation and treatment of your symptoms- there is concern for a mild heart attack or blood clot in your lungs.  Do let them know about the fall the other night.

## 2020-07-29 NOTE — ED Provider Notes (Signed)
[name]    CSN: [id number] Arrival date & time: 07/29/20  0941      History   Chief Complaint Chief Complaint  Patient presents with  . Chest discomfort    HPI Kayla Gallagher is a 69 y.o. female.   HPI  Kayla Gallagher is a 69 y.o. female presenting to UC with c/o intermittent chest discomfort since Sunday 07/25/20.  Pain started in her neck, she believed to be from her DDD but pain has since started to radiate down the left side of her neck into her arm and Left breast.  Pain is worse with deep breathing, difficult to take a normal good breath, pain and breathing worse when lying back.  She has taken ibuprofen and used ice without relief.  Pt does recall falling out of her bed 2 nights ago, falling on left side but is not sure if the pain is related since the neck pain started a few days prior.  Pain is 8/10.  Hx of HTN and DM but no hx of heart disease.  No recent illness- no cough, congestion, fever.  No hx of blood clots.    Past Medical History:  Diagnosis Date  . Depression   . Diabetes mellitus without complication ([place])   . Essential hypertension, benign 01/07/2013  . Headache(784.0)   . History of anemia   . History of hepatitis C 01/07/2013  . Hyperlipidemia   . Hypertension    Does not see cardiology    Patient Active Problem List   Diagnosis Date Noted  . Metatarsal stress fracture of right foot 01/26/2020  . Elevated serum creatinine 01/29/2018  . Menopausal symptoms 01/29/2018  . DDD (degenerative disc disease), cervical 06/22/2017  . Acute abdominal pain in left lower quadrant 06/22/2017  . Obesity 09/09/2015  . Anxiety state 05/04/2015  . Temporal arteritis ([place]) 06/18/2013  . Actinic keratosis 04/16/2013  . Type 2 diabetes mellitus ([place]) 02/21/2013  . Breast mass, right 02/21/2013  . Hyperplastic colon polyp 01/22/2013  . 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  . History of hepatitis C 01/07/2013  . Essential hypertension, benign 01/07/2013  .  Hyperlipidemia 01/07/2013  . History of breast cancer 01/07/2013  . Depression 01/07/2013    Past Surgical History:  Procedure Laterality Date  . ABDOMINAL HYSTERECTOMY  1976  . ABDOMINAL HYSTERECTOMY    . ARTERY BIOPSY Right 06/30/2013   Procedure: BIOPSY TEMPORAL ARTERY;  Surgeon: Rosetta Posner, MD;  Location: [facility];  Service: Vascular;  Laterality: Right;  . BREAST BIOPSY Right   . BREAST REDUCTION SURGERY    . BUNIONECTOMY    . REDUCTION MAMMAPLASTY    . tummy tuck      OB History   No obstetric history on file.      Home Medications    Prior to Admission medications   Medication Sig Start Date End Date Taking? Authorizing Provider  ALPRAZolam Duanne Moron) 0.5 MG tablet Take 1 tablet (0.5 mg total) by mouth at bedtime as needed for anxiety. 01/20/20   Breeback, Jade L, PA-C  amLODipine (NORVASC) 10 MG tablet TAKE 1 TABLET BY MOUTH EVERY DAY 05/04/20   Breeback, Jade L, PA-C  atorvastatin (LIPITOR) 10 MG tablet Take 1 tablet (10 mg total) by mouth daily. Needs appt/labs 07/05/20   Donella Stade, PA-C  Biotin 10 MG TABS Take by mouth.    [provider]  estrogens-methylTEST (ESTRATEST) 1.25-2.5 MG tablet TAKE 1 TABLET BY MOUTH EVERY DAY 04/20/20   Iran Planas  L, PA-C  hydrochlorothiazide (HYDRODIURIL) 25 MG tablet Take one tablet daily for blood pressure control./needs appt/labs 07/05/20   Breeback, Jade L, PA-C  JANUVIA 100 MG tablet Take 1 tablet (100 mg total) by mouth daily. 01/20/20   Breeback, Jade L, PA-C  lisinopril (ZESTRIL) 40 MG tablet TAKE 1 TABLET BY MOUTH EVERY DAY 03/29/20   Breeback, Jade L, PA-C  metFORMIN (GLUCOPHAGE) 1000 MG tablet TAKE 1 TABLET (1,000 MG TOTAL) BY MOUTH 2 (TWO) TIMES DAILY WITH A MEAL. NEEDS APPT 07/22/20   Donella Stade, PA-C  Multiple Vitamins-Minerals (MULTIVITAMIN PO) Take 1 tablet by mouth daily.    [provider]  PARoxetine (PAXIL) 40 MG tablet TAKE 1 TABLET BY MOUTH EVERY DAY IN THE MORNING 01/20/20   Donella Stade, PA-C      Family History Family History  Problem Relation Age of Onset  . Heart attack Father   . Diabetes Father        grandmother  . Cancer Father   . Heart disease Father   . Hyperlipidemia Father   . Hypertension Father   . Cancer Mother   . Alcoholism Other        parents  . Prostate cancer Other        grandfather  . Hyperlipidemia Other        grandfather  . Breast cancer Sister     Social History Social History   Tobacco Use  . Smoking status: Never Smoker  . Smokeless tobacco: Never Used  Vaping Use  . Vaping Use: Never used  Substance Use Topics  . Alcohol use: No  . Drug use: No     Allergies   Morphine and related   Review of Systems Review of Systems  Constitutional: Negative for chills and fever.  HENT: Negative for congestion, ear pain, sore throat, trouble swallowing and voice change.   Respiratory: Positive for shortness of breath. Negative for cough.   Cardiovascular: Positive for chest pain. Negative for palpitations and leg swelling.  Gastrointestinal: Negative for abdominal pain, diarrhea, nausea and vomiting.  Musculoskeletal: Negative for arthralgias, back pain and myalgias.  Skin: Negative for rash.  Neurological: Negative for dizziness, light-headedness and headaches.  All other systems reviewed and are negative.    Physical Exam Triage Vital Signs ED Triage Vitals  Enc Vitals Group     BP 07/29/20 0951 (!) 142/87     Pulse Rate 07/29/20 0951 95     Resp 07/29/20 0951 18     Temp 07/29/20 0951 98.3 F (36.8 C)     Temp Source 07/29/20 0951 Oral     SpO2 07/29/20 0951 94 %     Weight --      Height --      Head Circumference --      Peak Flow --      Pain Score 07/29/20 0953 8     Pain Loc --      Pain Edu? --      Excl. in [place]? --    No data found.  Updated Vital Signs BP (!) 142/87 (BP Location: Left Arm)   Pulse 95   Temp 98.3 F (36.8 C) (Oral)   Resp 18   SpO2 94%   Visual Acuity Right Eye Distance:   Left  Eye Distance:   Bilateral Distance:    Right Eye Near:   Left Eye Near:    Bilateral Near:     Physical Exam Vitals and nursing note reviewed.  Constitutional:      Appearance: Normal appearance. She is well-developed.     Comments: Pt sitting upright on exam table. Appears uncomfortable, bracing Left breast/side.  HENT:     Head: Normocephalic and atraumatic.     Nose: Nose normal.  Cardiovascular:     Rate and Rhythm: Normal rate and regular rhythm.  Pulmonary:     Effort: Pulmonary effort is normal. No respiratory distress.     Breath sounds: Normal breath sounds. No stridor. No wheezing, rhonchi or rales.  Chest:     Chest wall: Tenderness present.    Abdominal:     General: There is no distension.     Palpations: Abdomen is soft.     Tenderness: There is no abdominal tenderness.  Musculoskeletal:        General: Normal range of motion.     Cervical back: Normal range of motion.  Skin:    General: Skin is warm and dry.     Findings: No bruising, erythema or rash.  Neurological:     Mental Status: She is alert and oriented to person, place, and time.  Psychiatric:        Behavior: Behavior normal.      UC Treatments / Results  Labs (all labs ordered are listed, but only abnormal results are displayed) Labs Reviewed - No data to display  EKG Date/Time:07/29/2020   09:59:56 Ventricular Rate: 88 PR Interval: 160 QRS Duration: 98 QT Interval: 346 QTC Calculation: 418 P-R-T axes: 35   28   33 Text Interpretation: Normal sinus rhythm, nonspecific T wave abnormality, abnormal ECG  Change since EKG in 2014 which showed Normal Sinus Rhythm, normal ECG  Radiology No results found.  Procedures Procedures (including critical care time)  Medications Ordered in UC Medications  aspirin tablet 324 mg (324 mg Oral Given 07/29/20 1015)    Initial Impression / Assessment and Plan / UC Course  I have reviewed the triage vital signs and the nursing  notes.  Pertinent labs & imaging results that were available during my care of the patient were reviewed by me and considered in my medical decision making (see chart for details).    Pt appears uncomfortable, especially on lung exam while taking deep breast but no significant tenderness to palpation. O2 Sat 94% on RA Slight changes in EKG from 2014 with nonspecific T wave abnormality. Pt given aspirin 356m.  Recommend further evaluation in emergency department Pt understanding and agreeable Declined EMS transport, will have [facility].   Final Clinical Impressions(s) / UC Diagnoses   Final diagnoses:  Left-sided chest pain  Borderline low oxygen saturation level  Nonspecific abnormal electrocardiogram (ECG) (EKG)  Shortness of breath     Discharge Instructions      You have declined EMS transport but it is still advised you have your son-in-law drive you directly to the emergency department for further evaluation and treatment of your symptoms- there is concern for a mild heart attack or blood clot in your lungs.  Do let them know about the fall the other night.     ED Prescriptions    None     PDMP not reviewed this encounter.   PNoe Gens PA-C 07/29/20 1038

## 2020-07-29 NOTE — ED Notes (Signed)
Patient is being discharged from the Urgent Care and sent to the Emergency Department via POV driven by her son in law. Per Waylan Rocher PAC, patient is in need of higher level of care due to intermittent chest pain and abnormal EKG. Patient is aware and verbalizes understanding of plan of care.  Vitals:   07/29/20 0951  BP: (!) 142/87  Pulse: 95  Resp: 18  Temp: 98.3 F (36.8 C)  SpO2: 94%

## 2020-07-29 NOTE — ED Triage Notes (Signed)
Pt c/o intermittent chest discomfort since Sunday. Started in her neck, hx of degenerative disc disease. Pain radiates from LT side of neck down to ribs. Hard to take deep breaths. Ibuprofen prn. Ice tried.

## 2020-08-03 DIAGNOSIS — L718 Other rosacea: Secondary | ICD-10-CM | POA: Diagnosis not present

## 2020-08-04 ENCOUNTER — Ambulatory Visit (INDEPENDENT_AMBULATORY_CARE_PROVIDER_SITE_OTHER): Payer: BC Managed Care – PPO | Admitting: Physician Assistant

## 2020-08-04 ENCOUNTER — Other Ambulatory Visit: Payer: Self-pay

## 2020-08-04 VITALS — BP 144/74 | HR 80 | Wt 206.0 lb

## 2020-08-04 DIAGNOSIS — F410 Panic disorder [episodic paroxysmal anxiety] without agoraphobia: Secondary | ICD-10-CM

## 2020-08-04 DIAGNOSIS — S20212D Contusion of left front wall of thorax, subsequent encounter: Secondary | ICD-10-CM | POA: Diagnosis not present

## 2020-08-04 DIAGNOSIS — E119 Type 2 diabetes mellitus without complications: Secondary | ICD-10-CM | POA: Diagnosis not present

## 2020-08-04 DIAGNOSIS — I10 Essential (primary) hypertension: Secondary | ICD-10-CM | POA: Diagnosis not present

## 2020-08-04 LAB — POCT GLYCOSYLATED HEMOGLOBIN (HGB A1C): Hemoglobin A1C: 7.9 % — AB (ref 4.0–5.6)

## 2020-08-04 MED ORDER — ALPRAZOLAM 0.5 MG PO TABS: 0.5000 mg | ORAL_TABLET | Freq: Every evening | ORAL | 2 refills | Status: DC | PRN

## 2020-08-04 MED ORDER — METFORMIN HCL 1000 MG PO TABS: 1000.0000 mg | ORAL_TABLET | Freq: Two times a day (BID) | ORAL | 0 refills | Status: DC

## 2020-08-04 MED ORDER — JANUVIA 100 MG PO TABS: 100.0000 mg | ORAL_TABLET | Freq: Every day | ORAL | 2 refills | Status: DC

## 2020-08-04 NOTE — Progress Notes (Signed)
Subjective:    Patient ID: Kayla Gallagher, female    DOB: 07/04/1951, 69 y.o.   MRN: [id number]  HPI  Patient is a 69 year old obese female with type 2 diabetes, hypertension, panic attacks who presents to the clinic for 70-monthfollow-up.  Patient is not checking her sugars.  She is taking Metformin regularly but admits to not taking the Januvia.  There is a few mixup at the pharmacy and she never called office.  She is trying to make better diet choices.  She is not regularly exercising.  She denies any open sores or wounds.  She denies any hypoglycemic events.  She is not checking her blood pressure at home.  She is taking Norvasc and lisinopril daily.  She admits to not taking the HCTZ daily.  She does have some intermittent swelling of her lower extremities.  She denies any chest pain, palpitations, shortness of breath.  She did fall out of her bed on 07/28/2020.  She was having some left-sided rib pain.  She did go to the ED and ruled out fractures.  She is feeling better.  She denies any shortness of breath.  She still little sore in the area.   .. Active Ambulatory Problems    Diagnosis Date Noted  . History of hepatitis C 01/07/2013  . Essential hypertension, benign 01/07/2013  . Hyperlipidemia 01/07/2013  . History of breast cancer 01/07/2013  . Depression 01/07/2013  . 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  . Hyperplastic colon polyp 01/22/2013  . [place] 2 diabetes [place] 02/21/2013  . Breast mass, right 02/21/2013  . Actinic keratosis 04/16/2013  . Temporal arteritis ([place] 06/18/2013  . Anxiety state 05/04/2015  . Obesity 09/09/2015  . DDD (degenerative disc disease), cervical 06/22/2017  . Acute abdominal pain in left lower quadrant 06/22/2017  . Elevated serum creatinine 01/29/2018  . Menopausal symptoms 01/29/2018  . Metatarsal stress fracture of right foot 01/26/2020   Resolved Ambulatory Problems    Diagnosis Date Noted  . Strain of elbow, left 06/01/2014  .  Community acquired pneumonia of right middle lobe of lung 12/06/2017   Past Medical History:  Diagnosis Date  . Diabetes mellitus without complication ([place]   . Headache(784.0)   . History of anemia   . Hypertension       Review of Systems  All other systems reviewed and are negative.      Objective:   Physical Exam Vitals reviewed.  Constitutional:      Appearance: Normal appearance. She is obese.  HENT:     Head: Normocephalic.  Neck:     Vascular: No carotid bruit.  Cardiovascular:     Rate and Rhythm: Normal rate and regular rhythm.     Pulses: Normal pulses.     Heart sounds: Normal heart sounds.  Pulmonary:     Effort: Pulmonary effort is normal.     Breath sounds: Normal breath sounds.  Musculoskeletal:     Comments: No pain with palpation over left ribs to palpation.   Neurological:     General: No focal deficit present.     Mental Status: She is alert.  Psychiatric:        Mood and Affect: Mood normal.     .. [facility] 01/20/2020 10/20/2019 02/28/2019 11/29/2018 01/29/2018  Decreased Interest 0 0 0 0 0  Down, Depressed, Hopeless 0 1 0 0 0  PHQ - 2 Score 0 1 0 0 0  Altered sleeping 0 0 0 - 1  Tired, decreased energy 1 0 0 - 0  Change in appetite 0 0 0 - 1  Feeling bad or failure about yourself  0 0 0 - 0  Trouble concentrating 0 0 0 - 0  Moving slowly or fidgety/restless 0 0 0 - 0  Suicidal thoughts 0 0 0 - 0  PHQ-9 Score 1 1 0 - 2  Difficult doing work/chores - Not difficult at all Not difficult at all - Not difficult at all   .Marland Kitchen GAD 7 : Generalized Anxiety Score 01/20/2020 10/20/2019 02/28/2019 11/29/2018  Nervous, Anxious, on Edge 0 0 0 1  Control/stop worrying 0 0 0 0  Worry too much - different things 0 0 0 0  Trouble relaxing 0 0 0 0  Restless 1 0 0 0  Easily annoyed or irritable 1 1 0 0  Afraid - awful might happen 0 0 0 0  Total GAD 7 Score 2 1 0 1  Anxiety Difficulty - Not difficult at all Not difficult at all Not difficult at  all          Assessment & Plan:  [name] KitchenSuprena was seen today for diabetes.  Diagnoses and all orders for this visit:  Type 2 diabetes mellitus without complication, without long-term current use of insulin (HCC) -     POCT glycosylated hemoglobin (Hb A1C) -     JANUVIA 100 MG tablet; Take 1 tablet (100 mg total) by mouth daily. -     metFORMIN (GLUCOPHAGE) 1000 MG tablet; Take 1 tablet (1,000 mg total) by mouth 2 (two) times daily with a meal.  Essential hypertension, benign  Panic attacks -     ALPRAZolam (XANAX) 0.5 MG tablet; Take 1 tablet (0.5 mg total) by mouth at bedtime as needed for anxiety.  Contusion of rib on left side, subsequent encounter    Lab Results  Component Value Date   HGBA1C 7.9 (A) 08/04/2020   A1C not to goal.  Discussed DM diet.  Not taking [place]. Will start Continue metformin.  On ACE. BP not to goal. On norvasc. Not taking her HCTZ. Discussed to start this back. Goal under 130/90.  On statin.  UTD eye exam.  UTD foot exam. Declines covid vaccine, flu vaccine, pneumonia vaccines.  Follow up in 3 months.   Refilled xanax for as needed.   Rib pain is slowly resolving. No residual symptoms.

## 2020-08-04 NOTE — Patient Instructions (Signed)
Goal BP is under 130/90 for you  Restart HCTZ and januvia. A1C goal is under 7.

## 2020-08-09 ENCOUNTER — Encounter: Payer: Self-pay | Admitting: Physician Assistant

## 2020-08-09 DIAGNOSIS — F410 Panic disorder [episodic paroxysmal anxiety] without agoraphobia: Secondary | ICD-10-CM | POA: Insufficient documentation

## 2020-08-09 DIAGNOSIS — S20212A Contusion of left front wall of thorax, initial encounter: Secondary | ICD-10-CM | POA: Insufficient documentation

## 2020-09-21 ENCOUNTER — Other Ambulatory Visit: Payer: Self-pay | Admitting: Physician Assistant

## 2020-09-21 DIAGNOSIS — I10 Essential (primary) hypertension: Secondary | ICD-10-CM

## 2020-09-29 ENCOUNTER — Other Ambulatory Visit: Payer: Self-pay | Admitting: Physician Assistant

## 2020-09-29 DIAGNOSIS — I10 Essential (primary) hypertension: Secondary | ICD-10-CM

## 2020-09-29 DIAGNOSIS — E782 Mixed hyperlipidemia: Secondary | ICD-10-CM

## 2020-10-01 ENCOUNTER — Other Ambulatory Visit: Payer: Self-pay | Admitting: Physician Assistant

## 2020-10-01 DIAGNOSIS — I10 Essential (primary) hypertension: Secondary | ICD-10-CM

## 2020-10-24 ENCOUNTER — Other Ambulatory Visit: Payer: Self-pay | Admitting: Physician Assistant

## 2020-10-24 DIAGNOSIS — E119 Type 2 diabetes mellitus without complications: Secondary | ICD-10-CM

## 2020-11-10 ENCOUNTER — Other Ambulatory Visit: Payer: Self-pay

## 2020-11-10 ENCOUNTER — Encounter: Payer: Self-pay | Admitting: Physician Assistant

## 2020-11-10 ENCOUNTER — Ambulatory Visit (INDEPENDENT_AMBULATORY_CARE_PROVIDER_SITE_OTHER): Payer: BC Managed Care – PPO | Admitting: Physician Assistant

## 2020-11-10 VITALS — BP 150/76 | HR 82 | Ht 68.0 in | Wt 208.0 lb

## 2020-11-10 DIAGNOSIS — F410 Panic disorder [episodic paroxysmal anxiety] without agoraphobia: Secondary | ICD-10-CM | POA: Diagnosis not present

## 2020-11-10 DIAGNOSIS — I10 Essential (primary) hypertension: Secondary | ICD-10-CM

## 2020-11-10 DIAGNOSIS — E1165 Type 2 diabetes mellitus with hyperglycemia: Secondary | ICD-10-CM

## 2020-11-10 DIAGNOSIS — E782 Mixed hyperlipidemia: Secondary | ICD-10-CM | POA: Diagnosis not present

## 2020-11-10 DIAGNOSIS — F4321 Adjustment disorder with depressed mood: Secondary | ICD-10-CM

## 2020-11-10 DIAGNOSIS — E119 Type 2 diabetes mellitus without complications: Secondary | ICD-10-CM | POA: Diagnosis not present

## 2020-11-10 DIAGNOSIS — F4320 Adjustment disorder, unspecified: Secondary | ICD-10-CM | POA: Insufficient documentation

## 2020-11-10 LAB — COMPLETE METABOLIC PANEL WITH GFR
AG Ratio: 1.4 (calc) (ref 1.0–2.5)
ALT: 19 U/L (ref 6–29)
AST: 17 U/L (ref 10–35)
Albumin: 4.4 g/dL (ref 3.6–5.1)
Alkaline phosphatase (APISO): 61 U/L (ref 37–153)
BUN/Creatinine Ratio: 18 (calc) (ref 6–22)
BUN: 18 mg/dL (ref 7–25)
CO2: 27 mmol/L (ref 20–32)
Calcium: 10.1 mg/dL (ref 8.6–10.4)
Chloride: 102 mmol/L (ref 98–110)
Creat: 1 mg/dL — ABNORMAL HIGH (ref 0.60–0.93)
GFR, Est African American: 66 mL/min/{1.73_m2} (ref >=60)
GFR, Est Non African American: 57 mL/min/{1.73_m2} — ABNORMAL LOW (ref >=60)
Globulin: 3.2 g/dL (calc) (ref 1.9–3.7)
Glucose, Bld: 150 mg/dL — ABNORMAL HIGH (ref 65–99)
Potassium: 4.6 mmol/L (ref 3.5–5.3)
Sodium: 137 mmol/L (ref 135–146)
Total Bilirubin: 0.3 mg/dL (ref 0.2–1.2)
Total Protein: 7.6 g/dL (ref 6.1–8.1)

## 2020-11-10 LAB — LIPID PANEL W/REFLEX DIRECT LDL
Cholesterol: 153 mg/dL (ref <200)
HDL: 29 mg/dL — ABNORMAL LOW (ref >=50)
LDL Cholesterol (Calc): 91 mg/dL (calc)
Non-HDL Cholesterol (Calc): 124 mg/dL (calc) (ref <130)
Total CHOL/HDL Ratio: 5.3 (calc) — ABNORMAL HIGH (ref <5.0)
Triglycerides: 253 mg/dL — ABNORMAL HIGH (ref <150)

## 2020-11-10 LAB — POCT GLYCOSYLATED HEMOGLOBIN (HGB A1C): Hemoglobin A1C: 7.5 % — AB (ref 4.0–5.6)

## 2020-11-10 MED ORDER — METFORMIN HCL 1000 MG PO TABS: 1000.0000 mg | ORAL_TABLET | Freq: Two times a day (BID) | ORAL | 1 refills | Status: DC

## 2020-11-10 MED ORDER — AMLODIPINE BESYLATE 10 MG PO TABS: 10.0000 mg | ORAL_TABLET | Freq: Every day | ORAL | 1 refills | Status: DC

## 2020-11-10 MED ORDER — LISINOPRIL 40 MG PO TABS: 40.0000 mg | ORAL_TABLET | Freq: Every day | ORAL | 1 refills | Status: DC

## 2020-11-10 MED ORDER — HYDROCHLOROTHIAZIDE 25 MG PO TABS: ORAL_TABLET | ORAL | 1 refills | Status: DC

## 2020-11-10 MED ORDER — ATORVASTATIN CALCIUM 10 MG PO TABS: 10.0000 mg | ORAL_TABLET | Freq: Every day | ORAL | 3 refills | Status: DC

## 2020-11-10 MED ORDER — SITAGLIPTIN PHOSPHATE 100 MG PO TABS: 100.0000 mg | ORAL_TABLET | Freq: Every day | ORAL | 1 refills | Status: DC

## 2020-11-10 NOTE — Progress Notes (Signed)
Subjective:    Patient ID: Kayla Gallagher, female    DOB: Jan 10, 1951, 69 y.o.   MRN: [id number]  HPI  Pt is a 69 yo female with T2DM, HTN, HLD who presents to the clinic for 3 month follow up.   She is not checking her sugars. She is on metformin and [place]. She tolerates medication well. She denies any hypoglycemic events or open sores or wounds. She is not exercising or eating well. Her mother and aunt both died over past 66month and she has been feeding her emotions with sugar. She does plan to be better in the next 3 months.   Pt is out of her norvasc. Denies any CP, palpitations, headaches, vision changes or swelling.   She has used more xanax over the last 3 months.   .. Active Ambulatory Problems    Diagnosis Date Noted   History of hepatitis C 01/07/2013   Essential hypertension, benign 01/07/2013   Hyperlipidemia 01/07/2013   History of breast cancer 01/07/2013   Depression 01/07/2013   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   Hyperplastic colon polyp 01/22/2013   Type 2 diabetes mellitus ([place] 02/21/2013   Breast mass, right 02/21/2013   Actinic keratosis 04/16/2013   Temporal arteritis ([place] 06/18/2013   Anxiety state 05/04/2015   Obesity 09/09/2015   DDD (degenerative disc disease), cervical 06/22/2017   Acute abdominal pain in left lower quadrant 06/22/2017   Elevated serum creatinine 01/29/2018   Menopausal symptoms 01/29/2018   Metatarsal stress fracture of right foot 01/26/2020   Contusion of rib on left side 08/09/2020   Panic attacks 08/09/2020   Grief reaction 11/10/2020   Resolved Ambulatory Problems    Diagnosis Date Noted   Strain of elbow, left 06/01/2014   Community acquired pneumonia of right middle lobe of lung 12/06/2017   Past Medical History:  Diagnosis Date   Diabetes mellitus without complication (HCC)    HVCBSWHQP(591.6    History of anemia    Hypertension         Review of Systems  All other systems reviewed and  are negative.      Objective:   Physical Exam Vitals reviewed.  Constitutional:      Appearance: Normal appearance.  HENT:     Head: Normocephalic.  Neck:     Vascular: No carotid bruit.  Cardiovascular:     Rate and Rhythm: Normal rate and regular rhythm.     Pulses: Normal pulses.     Heart sounds: Normal heart sounds.  Pulmonary:     Effort: Pulmonary effort is normal.     Breath sounds: Normal breath sounds.  Musculoskeletal:     Right lower leg: No edema.     Left lower leg: No edema.  Neurological:     General: No focal deficit present.     Mental Status: She is alert and oriented to person, place, and time.  Psychiatric:        Mood and Affect: Mood normal.        Behavior: Behavior normal.       .. Results for orders placed or performed in visit on 11/10/20  POCT HgB A1C  Result Value Ref Range   Hemoglobin A1C 7.5 (A) 4.0 - 5.6 %   HbA1c POC (<> result, manual entry)     HbA1c, POC (prediabetic range)     HbA1c, POC (controlled diabetic range)         Assessment & Plan:  .[name] KitchenHaniawas seen today for diabetes.  Diagnoses and all orders for this visit:  Uncontrolled type 2 diabetes mellitus with hyperglycemia ([place]) -     POCT HgB A1C -     sitaGLIPtin (JANUVIA) 100 MG tablet; Take 1 tablet (100 mg total) by mouth daily. -     metFORMIN (GLUCOPHAGE) 1000 MG tablet; Take 1 tablet (1,000 mg total) by mouth 2 (two) times daily with a meal. -     COMPLETE METABOLIC PANEL WITH GFR  Essential hypertension, benign -     hydrochlorothiazide (HYDRODIURIL) 25 MG tablet; TAKE 1 TABLET BY MOUTH DAILY FOR BLOOD PRESSURE CONTROL. -     amLODipine (NORVASC) 10 MG tablet; Take 1 tablet (10 mg total) by mouth daily. -     lisinopril (ZESTRIL) 40 MG tablet; Take 1 tablet (40 mg total) by mouth daily. -     COMPLETE METABOLIC PANEL WITH GFR  Mixed hyperlipidemia -     atorvastatin (LIPITOR) 10 MG tablet; Take 1 tablet (10 mg total) by mouth daily. -     Lipid Panel  w/reflex Direct LDL  Panic attacks  Grief reaction   Screening labs ordered today.    A1C did go down some but not to goal.  Pt says she has done terrible the 3 months and wants one more chance before adding another medication.  Discussed DM diet and exercise. Discussed risk of uncontrolled diabetes.  On statin. Ordered lipid today.  On ACE. BP not to goal but been out of medication. Keep check at home goal under 130/90. Eye exam and foot exam UTD.  Declines all vaccines.   Discussed grief. Use xanax only as needed should have refills.   Follow up in 3 months.

## 2020-11-15 NOTE — Progress Notes (Signed)
Jeyda,   Kidney, liver are stable.  Glucose up as expected.  With LDL not to goal and TG elevated we need to push up lipitor to 40mg . Are your ok with that.?

## 2020-12-04 ENCOUNTER — Other Ambulatory Visit: Payer: Self-pay | Admitting: Physician Assistant

## 2020-12-04 DIAGNOSIS — N951 Menopausal and female climacteric states: Secondary | ICD-10-CM

## 2020-12-06 NOTE — Telephone Encounter (Signed)
Last written 04/20/2020 #90 with 1 refill Last appt 11/10/2020

## 2020-12-06 NOTE — Telephone Encounter (Signed)
Sent!

## 2021-03-07 ENCOUNTER — Emergency Department (INDEPENDENT_AMBULATORY_CARE_PROVIDER_SITE_OTHER)
Admission: EM | Admit: 2021-03-07 | Discharge: 2021-03-07 | Disposition: A | Discharge status: Home / Self Care | Payer: BC Managed Care – PPO | Source: Home / Self Care

## 2021-03-07 DIAGNOSIS — S6992XA Unspecified injury of left wrist, hand and finger(s), initial encounter: Secondary | ICD-10-CM | POA: Diagnosis not present

## 2021-03-07 DIAGNOSIS — W540XXA Bitten by dog, initial encounter: Secondary | ICD-10-CM

## 2021-03-07 MED ORDER — AMOXICILLIN-POT CLAVULANATE 875-125 MG PO TABS: 1.0000 | ORAL_TABLET | Freq: Two times a day (BID) | ORAL | 0 refills | Status: AC

## 2021-03-07 NOTE — ED Triage Notes (Signed)
Patient presents to Urgent Care with complaints of dog bite (her own dog) since about an hour ago. Patient reports she has a laceration and two puncture marks to the left index finger. Bleeding controlled at this time, dog is up to date on vaccinations.

## 2021-03-07 NOTE — Discharge Instructions (Addendum)
I have sent in Augmentin for you to take twice a day for 7 days.  We have placed 7 sutures on the top of your left index finger.  As well as 1 additional 2 the palmar side of your left index finger.  Follow-up in this office in 5 to 7 days for suture removal.  Follow-up sooner for increased redness, heat, swelling, drainage from the area.  Follow-up in the ER for red streaking up your hand or arm, high fever, trouble swallowing, trouble breathing, other concerning symptoms

## 2021-03-07 NOTE — ED Provider Notes (Signed)
[name]    CSN: [id number] Arrival date & time: 03/07/21  1441      History   Chief Complaint Chief Complaint  Patient presents with  . Animal Bite    Left hand    HPI Kayla Gallagher is a 70 y.o. female.   Reports that she was bitten by her dog when breaking up a fight with her other dog. States that the bite came from a [place]. Reports laceration to the L index finger. States that dog is UTD on rabies vaccine. Denies foreign body to the area, denies drainage from the area, states bleeding is controlled, denies erythema, swelling, tenderness to the area.  ROS per HPI      Past Medical History:  Diagnosis Date  . Depression   . Diabetes mellitus without complication ([place])   . Essential hypertension, benign 01/07/2013  . Headache(784.0)   . History of anemia   . History of hepatitis C 01/07/2013  . Hyperlipidemia   . Hypertension    Does not see cardiology    Patient Active Problem List   Diagnosis Date Noted  . Grief reaction 11/10/2020  . Contusion of rib on left side 08/09/2020  . Panic attacks 08/09/2020  . Metatarsal stress fracture of right foot 01/26/2020  . Elevated serum creatinine 01/29/2018  . Menopausal symptoms 01/29/2018  . DDD (degenerative disc disease), cervical 06/22/2017  . Acute abdominal pain in left lower quadrant 06/22/2017  . Obesity 09/09/2015  . Anxiety state 05/04/2015  . Temporal arteritis ([place]) 06/18/2013  . Actinic keratosis 04/16/2013  . Type 2 diabetes mellitus ([place]) 02/21/2013  . Breast mass, right 02/21/2013  . Hyperplastic colon polyp 01/22/2013  . 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  . History of hepatitis C 01/07/2013  . Essential hypertension, benign 01/07/2013  . Hyperlipidemia 01/07/2013  . History of breast cancer 01/07/2013  . Depression 01/07/2013    Past Surgical History:  Procedure Laterality Date  . ABDOMINAL HYSTERECTOMY  1976  . ABDOMINAL HYSTERECTOMY    . ARTERY BIOPSY Right 06/30/2013    Procedure: BIOPSY TEMPORAL ARTERY;  Surgeon: Rosetta Posner, MD;  Location: [facility];  Service: Vascular;  Laterality: Right;  . BREAST BIOPSY Right   . BREAST REDUCTION SURGERY    . BUNIONECTOMY    . REDUCTION MAMMAPLASTY    . tummy tuck      OB History   No obstetric history on file.      Home Medications    Prior to Admission medications   Medication Sig Start Date End Date Taking? Authorizing Provider  ALPRAZolam Duanne Moron) 0.5 MG tablet Take 1 tablet (0.5 mg total) by mouth at bedtime as needed for anxiety. 08/04/20   Breeback, Jade L, PA-C  amLODipine (NORVASC) 10 MG tablet Take 1 tablet (10 mg total) by mouth daily. 11/10/20   Breeback, Jade L, PA-C  atorvastatin (LIPITOR) 10 MG tablet Take 1 tablet (10 mg total) by mouth daily. 11/10/20   Breeback, Royetta Car, PA-C  Biotin 10 MG TABS Take by mouth.    [provider]  estrogens-methylTEST (ESTRATEST) 1.25-2.5 MG tablet TAKE 1 TABLET BY MOUTH EVERY DAY 12/06/20   Breeback, Jade L, PA-C  hydrochlorothiazide (HYDRODIURIL) 25 MG tablet TAKE 1 TABLET BY MOUTH DAILY FOR BLOOD PRESSURE CONTROL. 11/10/20   Breeback, Jade L, PA-C  ibuprofen (ADVIL) 800 MG tablet Take by mouth. 07/29/20   [provider]  lisinopril (ZESTRIL) 40 MG tablet Take 1 tablet (40 mg total) by mouth daily. 11/10/20  Breeback, Jade L, PA-C  metFORMIN (GLUCOPHAGE) 1000 MG tablet Take 1 tablet (1,000 mg total) by mouth 2 (two) times daily with a meal. 11/10/20   Breeback, Jade L, PA-C  Multiple Vitamins-Minerals (MULTIVITAMIN PO) Take 1 tablet by mouth daily.    [provider]  PARoxetine (PAXIL) 40 MG tablet TAKE 1 TABLET BY MOUTH EVERY DAY IN THE MORNING 01/20/20   Breeback, Jade L, PA-C  sitaGLIPtin (JANUVIA) 100 MG tablet Take 1 tablet (100 mg total) by mouth daily. 11/10/20   Donella Stade, PA-C    Family History Family History  Problem Relation Age of Onset  . Heart attack Father   . Diabetes Father        grandmother  . Cancer Father    . Heart disease Father   . Hyperlipidemia Father   . Hypertension Father   . Cancer Mother   . Alcoholism Other        parents  . Prostate cancer Other        grandfather  . Hyperlipidemia Other        grandfather  . Breast cancer Sister     Social History Social History   Tobacco Use  . Smoking status: Never Smoker  . Smokeless tobacco: Never Used  Vaping Use  . Vaping Use: Never used  Substance Use Topics  . Alcohol use: No  . Drug use: No     Allergies   Morphine and related   Review of Systems Review of Systems   Physical Exam Triage Vital Signs ED Triage Vitals  Enc Vitals Group     BP 03/07/21 1452 (!) 152/93     Pulse Rate 03/07/21 1452 (!) 101     Resp 03/07/21 1452 16     Temp 03/07/21 1452 98.3 F (36.8 C)     Temp Source 03/07/21 1452 Oral     SpO2 03/07/21 1452 97 %     Weight --      Height --      Head Circumference --      Peak Flow --      Pain Score 03/07/21 1450 1     Pain Loc --      Pain Edu? --      Excl. in [place]? --    No data found.  Updated Vital Signs BP (!) 152/93 (BP Location: Right Arm)   Pulse (!) 101   Temp 98.3 F (36.8 C) (Oral)   Resp 16   SpO2 97%       Physical Exam Vitals and nursing note reviewed.  Constitutional:      General: She is not in acute distress.    Appearance: Normal appearance. She is well-developed. She is not ill-appearing.  HENT:     Head: Normocephalic and atraumatic.     Nose: Nose normal.     Mouth/Throat:     Mouth: Mucous membranes are moist.     Pharynx: Oropharynx is clear.  Eyes:     Extraocular Movements: Extraocular movements intact.     Conjunctiva/sclera: Conjunctivae normal.     Pupils: Pupils are equal, round, and reactive to light.  Cardiovascular:     Rate and Rhythm: Normal rate and regular rhythm.     Heart sounds: No murmur heard.   Pulmonary:     Effort: Pulmonary effort is normal. No respiratory distress.     Breath sounds: Normal breath sounds.   Abdominal:     Palpations: Abdomen is soft.  Tenderness: There is no abdominal tenderness.  Musculoskeletal:        General: Normal range of motion.     Cervical back: Normal range of motion and neck supple.  Skin:    General: Skin is warm and dry.     Capillary Refill: Capillary refill takes less than 2 seconds.     Findings: Laceration and wound present.          Comments: 1.5 cm laceration to L index finger at MIP joint on dorsal surface of the finger. Also puncture wound to palmar surface of the R index finger in the pad between PIP and MIP joints. Bleeding controlled.   Neurological:     General: No focal deficit present.     Mental Status: She is alert and oriented to person, place, and time.  Psychiatric:        Mood and Affect: Mood normal.        Behavior: Behavior normal.        Thought Content: Thought content normal.      UC Treatments / Results  Labs (all labs ordered are listed, but only abnormal results are displayed) Labs Reviewed - No data to display  EKG   Radiology No results found.  Procedures Laceration Repair  Date/Time: 03/07/2021 3:37 PM Performed by: Faustino Congress, NP Authorized by: Faustino Congress, NP   Consent:    Consent obtained:  Verbal   Consent given by:  Patient   Risks, benefits, and alternatives were discussed: yes     Risks discussed:  Infection and pain Universal protocol:    Patient identity confirmed:  Verbally with patient and arm band Anesthesia:    Anesthesia method:  Local infiltration   Local anesthetic:  Lidocaine 2% w/o epi Laceration details:    Location:  Finger   Finger location:  L index finger   Length (cm):  1.5   Depth (mm):  3 Exploration:    Contaminated: no   Treatment:    Area cleansed with:  Saline and [place]ur-Clens   Amount of cleaning:  Standard   Irrigation method:  Syringe   Debridement:  None   Undermining:  None   Scar revision: no   Skin repair:    Repair method:  Sutures    Suture size:  3-0   Suture material:  Prolene   Suture technique:  Simple interrupted   Number of sutures:  7 Approximation:    Approximation:  Close Post-procedure details:    Dressing:  Non-adherent dressing   Procedure completion:  Tolerated well, no immediate complications   (including critical care time)  Medications Ordered in UC Medications - No data to display  Initial Impression / Assessment and Plan / UC Course  I have reviewed the triage vital signs and the nursing notes.  Pertinent labs & imaging results that were available during my care of the patient were reviewed by me and considered in my medical decision making (see chart for details).    Dog Bite Left finger injury   Laceration repaired as above Tolerated well Augmentin 865m BID x 7 days presribed Follow up in 5-7 days for suture removal Follow up with this office or with primary care for increased swelling, redness, tenderness, warmth, drainage from the area.  Follow up in the ER for red streaking up your hand, high fever, trouble swallowing, trouble breathing, other concerning symptoms.   Final Clinical Impressions(s) / UC Diagnoses   Final diagnoses:  Dog bite, initial encounter  Finger  injury, left, initial encounter     Discharge Instructions     I have sent in Augmentin for you to take twice a day for 7 days.  We have placed 7 sutures on the top of your left index finger.  As well as 1 additional 2 the palmar side of your left index finger.  Follow-up in this office in 5 to 7 days for suture removal.  Follow-up sooner for increased redness, heat, swelling, drainage from the area.  Follow-up in the ER for red streaking up your hand or arm, high fever, trouble swallowing, trouble breathing, other concerning symptoms      ED Prescriptions    Medication Sig Dispense Auth. Provider   amoxicillin-clavulanate (AUGMENTIN) 875-125 MG tablet Take 1 tablet by mouth 2 (two) times daily for 7  days. 14 tablet Faustino Congress, NP     PDMP not reviewed this encounter.   Faustino Congress, NP 03/15/21 1615

## 2021-03-08 ENCOUNTER — Ambulatory Visit (INDEPENDENT_AMBULATORY_CARE_PROVIDER_SITE_OTHER): Payer: BC Managed Care – PPO | Admitting: Medical-Surgical

## 2021-03-08 ENCOUNTER — Other Ambulatory Visit: Payer: Self-pay

## 2021-03-08 ENCOUNTER — Encounter: Payer: Self-pay | Admitting: Medical-Surgical

## 2021-03-08 VITALS — BP 143/78 | HR 99 | Temp 98.6°F | Ht 68.0 in | Wt 211.0 lb

## 2021-03-08 DIAGNOSIS — S61259D Open bite of unspecified finger without damage to nail, subsequent encounter: Secondary | ICD-10-CM | POA: Diagnosis not present

## 2021-03-08 DIAGNOSIS — W540XXD Bitten by dog, subsequent encounter: Secondary | ICD-10-CM

## 2021-03-08 MED ORDER — TRAMADOL HCL 50 MG PO TABS: 50.0000 mg | ORAL_TABLET | Freq: Three times a day (TID) | ORAL | 0 refills | Status: AC | PRN

## 2021-03-08 NOTE — Progress Notes (Signed)
Subjective:    CC: dog bite  HPI: Pleasant 70 year old female presenting for evaluation of a dog bite that happened yesterday. Her [place] and pit/Jack Benita Gutter got into a fight and when she reached down to break it up, she missed and ended up with her finger in the [place] Shepherd's mouth. She went to [facility] and had it evaluated. They sutured the bite and started Augmentin. They wrapped her finger and she was sent home with tylenol or Ibuprofen for pain. Last night, the pain became severe and she unwrapped it. Notes that it has not drained but her hand is swollen and red across the knuckles. Tolerating the Augmentin well. Denies fevers and chills.   I reviewed the past medical history, family history, social history, surgical history, and allergies today and no changes were needed.  Please see the problem list section below in epic for further details.  Past Medical History: Past Medical History:  Diagnosis Date  . Depression   . Diabetes mellitus without complication (H[facility])   . Essential hypertension, benign 01/07/2013  . Headache(784.0)   . History of anemia   . History of hepatitis C 01/07/2013  . Hyperlipidemia   . Hypertension    Does not see cardiology   Past Surgical History: Past Surgical History:  Procedure Laterality Date  . ABDOMINAL HYSTERECTOMY  1976  . ABDOMINAL HYSTERECTOMY    . ARTERY BIOPSY Right 06/30/2013   Procedure: BIOPSY TEMPORAL ARTERY;  Surgeon: Larina Earthly, MD;  Location: [facility] OR;  Service: Vascular;  Laterality: Right;  . BREAST BIOPSY Right   . BREAST REDUCTION SURGERY    . BUNIONECTOMY    . REDUCTION MAMMAPLASTY    . tummy tuck     Social History: Social History   Socioeconomic History  . Marital status: Single    Spouse name: Not on file  . Number of children: Not on file  . Years of education: Not on file  . Highest education level: Not on file  Occupational History  . Not on file  Tobacco Use  . Smoking status: Never Smoker  . Smokeless  tobacco: Never Used  Vaping Use  . Vaping Use: Never used  Substance and Sexual Activity  . Alcohol use: No  . Drug use: No  . Sexual activity: Not Currently  Other Topics Concern  . Not on file  Social History Narrative  . Not on file   Social Determinants of Health   Financial Resource Strain: Not on file  Food Insecurity: Not on file  Transportation Needs: Not on file  Physical Activity: Not on file  Stress: Not on file  Social Connections: Not on file   Family History: Family History  Problem Relation Age of Onset  . Heart attack Father   . Diabetes Father        grandmother  . Cancer Father   . Heart disease Father   . Hyperlipidemia Father   . Hypertension Father   . Cancer Mother   . Alcoholism Other        parents  . Prostate cancer Other        grandfather  . Hyperlipidemia Other        grandfather  . Breast cancer Sister    Allergies: Allergies  Allergen Reactions  . Morphine And Related Nausea And Vomiting   Medications: See med rec.  Review of Systems: See HPI for pertinent positives and negatives.   Objective:    General: Well Developed, well nourished, and in  no acute distress.  Neuro: Alert and oriented x3.  HEENT: Normocephalic, atraumatic.  Skin: Warm and dry. See clinical photos.  Cardiac: Regular rate and rhythm, no murmurs rubs or gallops, no lower extremity edema.  Respiratory: Clear to auscultation bilaterally. Not using accessory muscles, speaking in full sentences.       Impression and Recommendations:    1. Dog bite, subsequent encounter Continue Augmentin twice daily as prescribed. Ok to leave the sutures uncovered as long as no danger of getting contaminated. Reviewed signs of infection to monitor for. Return in 12 days for suture removal.   Return in about 12 days (around 03/20/2021) for suture removal or sooner if needed. ___________________________________________ Thayer Ohm, DNP, APRN, FNP-BC Primary Care and  Sports Medicine [facility] [place]

## 2021-03-21 ENCOUNTER — Ambulatory Visit: Payer: BC Managed Care – PPO | Admitting: Medical-Surgical

## 2021-03-21 DIAGNOSIS — W540XXD Bitten by dog, subsequent encounter: Secondary | ICD-10-CM

## 2021-04-08 ENCOUNTER — Emergency Department (INDEPENDENT_AMBULATORY_CARE_PROVIDER_SITE_OTHER)
Admission: EM | Admit: 2021-04-08 | Discharge: 2021-04-08 | Disposition: A | Discharge status: Home / Self Care | Payer: BC Managed Care – PPO | Source: Home / Self Care

## 2021-04-08 DIAGNOSIS — H02845 Edema of left lower eyelid: Secondary | ICD-10-CM | POA: Diagnosis not present

## 2021-04-08 DIAGNOSIS — H5712 Ocular pain, left eye: Secondary | ICD-10-CM

## 2021-04-08 MED ORDER — PREDNISONE 20 MG PO TABS: ORAL_TABLET | ORAL | 0 refills | Status: DC

## 2021-04-08 MED ORDER — MOXIFLOXACIN HCL 0.5 % OP SOLN: 1.0000 [drp] | Freq: Three times a day (TID) | OPHTHALMIC | 0 refills | Status: AC

## 2021-04-08 NOTE — ED Triage Notes (Signed)
Pt c/o eye redness and drainage since this am. Some light sensitivity. Says she woke up with some eye pain like she had poked herself in the eye. OTC drops tried. Pain 5/10

## 2021-04-08 NOTE — Discharge Instructions (Addendum)
Advised patient to start prednisone burst tomorrow, Saturday, 04/09/2021 for swelling/edema of left lower eyelid.  Advised patient may start ocular drops tonight use as prescribed.  Advised patient may use artificial tears or  blink tears for dry eye.  Advised patient not to use Clear Eyes or Visine as these are vasoconstrictors and will delay healing of left eye.

## 2021-04-08 NOTE — ED Provider Notes (Signed)
[facility]    CSN: [id number] Arrival date & time: 04/08/21  1905      History   Chief Complaint Chief Complaint  Patient presents with  . Eye Problem    LT    HPI Kayla Gallagher is a 70 y.o. female.   HPI 70 year old female presents with left eye redness and drainage since earlier this morning.  Reports mild pain like she had poked herself in the left eye, and OTC eyedrops (Clear Eyes) with little to no relief.  Past Medical History:  Diagnosis Date  . Depression   . Diabetes mellitus without complication ([place])   . Essential hypertension, benign 01/07/2013  . Headache(784.0)   . History of anemia   . History of hepatitis C 01/07/2013  . Hyperlipidemia   . Hypertension    Does not see cardiology    Patient Active Problem List   Diagnosis Date Noted  . Grief reaction 11/10/2020  . Contusion of rib on left side 08/09/2020  . Panic attacks 08/09/2020  . Metatarsal stress fracture of right foot 01/26/2020  . Elevated serum creatinine 01/29/2018  . Menopausal symptoms 01/29/2018  . DDD (degenerative disc disease), cervical 06/22/2017  . Acute abdominal pain in left lower quadrant 06/22/2017  . Obesity 09/09/2015  . Anxiety state 05/04/2015  . Temporal arteritis ([place]) 06/18/2013  . Actinic keratosis 04/16/2013  . Type 2 diabetes mellitus ([place]) 02/21/2013  . Breast mass, right 02/21/2013  . Hyperplastic colon polyp 01/22/2013  . 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  . History of hepatitis C 01/07/2013  . Essential hypertension, benign 01/07/2013  . Hyperlipidemia 01/07/2013  . History of breast cancer 01/07/2013  . Depression 01/07/2013    Past Surgical History:  Procedure Laterality Date  . ABDOMINAL HYSTERECTOMY  1976  . ABDOMINAL HYSTERECTOMY    . ARTERY BIOPSY Right 06/30/2013   Procedure: BIOPSY TEMPORAL ARTERY;  Surgeon: Rosetta Posner, MD;  Location: [facility];  Service: Vascular;  Laterality: Right;  . BREAST BIOPSY Right   . BREAST REDUCTION SURGERY    .  BUNIONECTOMY    . REDUCTION MAMMAPLASTY    . tummy tuck      OB History   No obstetric history on file.      Home Medications    Prior to Admission medications   Medication Sig Start Date End Date Taking? Authorizing Provider  moxifloxacin (VIGAMOX) 0.5 % ophthalmic solution Place 1 drop into the left eye 3 (three) times daily for 5 days. 04/08/21 04/13/21 Yes Eliezer Lofts, FNP  predniSONE (DELTASONE) 20 MG tablet Take 2 tabs PO daily x 5 days. 04/08/21  Yes Eliezer Lofts, FNP  ALPRAZolam Duanne Moron) 0.5 MG tablet Take 1 tablet (0.5 mg total) by mouth at bedtime as needed for anxiety. 08/04/20   Breeback, Jade L, PA-C  amLODipine (NORVASC) 10 MG tablet Take 1 tablet (10 mg total) by mouth daily. 11/10/20   Breeback, Jade L, PA-C  atorvastatin (LIPITOR) 10 MG tablet Take 1 tablet (10 mg total) by mouth daily. 11/10/20   Breeback, Royetta Car, PA-C  Biotin 10 MG TABS Take by mouth.    [provider]  estrogens-methylTEST (ESTRATEST) 1.25-2.5 MG tablet TAKE 1 TABLET BY MOUTH EVERY DAY 12/06/20   Breeback, Jade L, PA-C  hydrochlorothiazide (HYDRODIURIL) 25 MG tablet TAKE 1 TABLET BY MOUTH DAILY FOR BLOOD PRESSURE CONTROL. 11/10/20   Breeback, Jade L, PA-C  ibuprofen (ADVIL) 800 MG tablet Take by mouth. 07/29/20   [provider]  lisinopril (ZESTRIL) 40  MG tablet Take 1 tablet (40 mg total) by mouth daily. 11/10/20   Donella Stade, PA-C  metFORMIN (GLUCOPHAGE) 1000 MG tablet Take 1 tablet (1,000 mg total) by mouth 2 (two) times daily with a meal. 11/10/20   Breeback, Jade L, PA-C  Multiple Vitamins-Minerals (MULTIVITAMIN PO) Take 1 tablet by mouth daily.    [provider]  PARoxetine (PAXIL) 40 MG tablet TAKE 1 TABLET BY MOUTH EVERY DAY IN THE MORNING 01/20/20   Breeback, Jade L, PA-C  sitaGLIPtin (JANUVIA) 100 MG tablet Take 1 tablet (100 mg total) by mouth daily. 11/10/20   Donella Stade, PA-C    Family History Family History  Problem Relation Age of Onset  . Heart  attack Father   . Diabetes Father        grandmother  . Cancer Father   . Heart disease Father   . Hyperlipidemia Father   . Hypertension Father   . Cancer Mother   . Alcoholism Other        parents  . Prostate cancer Other        grandfather  . Hyperlipidemia Other        grandfather  . Breast cancer Sister     Social History Social History   Tobacco Use  . Smoking status: Never Smoker  . Smokeless tobacco: Never Used  Vaping Use  . Vaping Use: Never used  Substance Use Topics  . Alcohol use: No  . Drug use: No     Allergies   Morphine and related   Review of Systems Review of Systems  Constitutional: Negative.   HENT: Negative.   Eyes: Positive for pain and redness.  Respiratory: Negative.   Cardiovascular: Negative.   Gastrointestinal: Negative.   Genitourinary: Negative.   Musculoskeletal: Negative.   Skin: Negative.   Neurological: Negative.      Physical Exam Triage Vital Signs ED Triage Vitals  Enc Vitals Group     BP 04/08/21 1918 (!) 167/80     Pulse Rate 04/08/21 1918 99     Resp 04/08/21 1918 16     Temp 04/08/21 1918 98.2 F (36.8 C)     Temp Source 04/08/21 1918 Oral     SpO2 04/08/21 1918 95 %     Weight --      Height --      Head Circumference --      Peak Flow --      Pain Score 04/08/21 1921 5     Pain Loc --      Pain Edu? --      Excl. in [place]? --    No data found.  Updated Vital Signs BP (!) 167/80 (BP Location: Right Arm)   Pulse 99   Temp 98.2 F (36.8 C) (Oral)   Resp 16   SpO2 95%      Physical Exam Vitals and nursing note reviewed.  Constitutional:      General: She is not in acute distress.    Appearance: Normal appearance. She is obese. She is not ill-appearing.  HENT:     Head: Normocephalic and atraumatic.     Mouth/Throat:     Mouth: Mucous membranes are moist.     Pharynx: Oropharynx is clear.  Eyes:     Extraocular Movements: Extraocular movements intact.     Pupils: Pupils are equal, round,  and reactive to light.     Comments: Left conjunctive: +3 injection/redness; 4 drops of tetracaine hydrochloride ophthalmic solution placed  into left eye, several minutes later lower eyelid inspected, upper eyelid inverted and inspected, no foreign body noted, fluorescein strip used, no corneal abrasion of left eye noted, left eye irrigated with normal saline, patient tolerated procedure well.  Cardiovascular:     Rate and Rhythm: Normal rate and regular rhythm.     Pulses: Normal pulses.     Heart sounds: Normal heart sounds.  Pulmonary:     Effort: Pulmonary effort is normal.     Breath sounds: Normal breath sounds.     Comments: No adventitious breath sounds noted Musculoskeletal:        General: Normal range of motion.     Cervical back: Normal range of motion and neck supple. No tenderness.  Lymphadenopathy:     Cervical: No cervical adenopathy.  Skin:    General: Skin is warm and dry.  Neurological:     General: No focal deficit present.     Mental Status: She is oriented to person, place, and time.  Psychiatric:        Mood and Affect: Mood normal.        Behavior: Behavior normal.      UC Treatments / Results  Labs (all labs ordered are listed, but only abnormal results are displayed) Labs Reviewed - No data to display  EKG   Radiology No results found.  Procedures Procedures (including critical care time)  Medications Ordered in UC Medications - No data to display  Initial Impression / Assessment and Plan / UC Course  I have reviewed the triage vital signs and the nursing notes.  Pertinent labs & imaging results that were available during my care of the patient were reviewed by me and considered in my medical decision making (see chart for details).    MDM: 1. Left eye pain, 2.  Swelling of left lower eyelid.  Patient discharged home, hemodynamically stable. Final Clinical Impressions(s) / UC Diagnoses   Final diagnoses:  Left eye pain  Swelling of left  lower eyelid     Discharge Instructions     Advised patient to start prednisone burst tomorrow, Saturday, 04/09/2021 for swelling/edema of left lower eyelid.  Advised patient may start ocular drops tonight use as prescribed.  Advised patient may use artificial tears or  blink tears for dry eye.  Advised patient not to use Clear Eyes or Visine as these are vasoconstrictors and will delay healing of left eye.     ED Prescriptions    Medication Sig Dispense Auth. Provider   predniSONE (DELTASONE) 20 MG tablet Take 2 tabs PO daily x 5 days. 10 tablet Eliezer Lofts, FNP   moxifloxacin (VIGAMOX) 0.5 % ophthalmic solution Place 1 drop into the left eye 3 (three) times daily for 5 days. 3 mL Eliezer Lofts, FNP     PDMP not reviewed this encounter.   Eliezer Lofts, [place] 04/08/21 2055

## 2021-04-17 ENCOUNTER — Other Ambulatory Visit: Payer: Self-pay | Admitting: Physician Assistant

## 2021-04-17 DIAGNOSIS — F32A Depression, unspecified: Secondary | ICD-10-CM

## 2021-05-09 ENCOUNTER — Other Ambulatory Visit: Payer: Self-pay | Admitting: Physician Assistant

## 2021-05-09 DIAGNOSIS — I10 Essential (primary) hypertension: Secondary | ICD-10-CM

## 2021-05-10 ENCOUNTER — Other Ambulatory Visit: Payer: Self-pay | Admitting: Physician Assistant

## 2021-05-10 DIAGNOSIS — E1165 Type 2 diabetes mellitus with hyperglycemia: Secondary | ICD-10-CM

## 2021-05-29 ENCOUNTER — Other Ambulatory Visit: Payer: Self-pay | Admitting: Physician Assistant

## 2021-05-29 DIAGNOSIS — I10 Essential (primary) hypertension: Secondary | ICD-10-CM

## 2021-06-03 ENCOUNTER — Other Ambulatory Visit: Payer: Self-pay | Admitting: Physician Assistant

## 2021-06-03 DIAGNOSIS — E1165 Type 2 diabetes mellitus with hyperglycemia: Secondary | ICD-10-CM

## 2021-06-03 DIAGNOSIS — I10 Essential (primary) hypertension: Secondary | ICD-10-CM

## 2021-06-10 ENCOUNTER — Other Ambulatory Visit: Payer: Self-pay | Admitting: Physician Assistant

## 2021-06-10 DIAGNOSIS — E1165 Type 2 diabetes mellitus with hyperglycemia: Secondary | ICD-10-CM

## 2021-06-22 ENCOUNTER — Other Ambulatory Visit: Payer: Self-pay | Admitting: Neurology

## 2021-06-22 DIAGNOSIS — F410 Panic disorder [episodic paroxysmal anxiety] without agoraphobia: Secondary | ICD-10-CM

## 2021-06-22 DIAGNOSIS — E1165 Type 2 diabetes mellitus with hyperglycemia: Secondary | ICD-10-CM

## 2021-06-22 DIAGNOSIS — N951 Menopausal and female climacteric states: Secondary | ICD-10-CM

## 2021-06-22 DIAGNOSIS — F32A Depression, unspecified: Secondary | ICD-10-CM

## 2021-06-22 DIAGNOSIS — E782 Mixed hyperlipidemia: Secondary | ICD-10-CM

## 2021-06-22 DIAGNOSIS — I10 Essential (primary) hypertension: Secondary | ICD-10-CM

## 2021-06-22 MED ORDER — ALPRAZOLAM 0.5 MG PO TABS
0.5000 mg | ORAL_TABLET | Freq: Every evening | ORAL | 0 refills | Status: DC | PRN
Start: 2021-06-22 — End: 2022-05-17

## 2021-06-22 MED ORDER — ATORVASTATIN CALCIUM 10 MG PO TABS: 10.0000 mg | ORAL_TABLET | Freq: Every day | ORAL | 0 refills | Status: DC

## 2021-06-22 MED ORDER — SITAGLIPTIN PHOSPHATE 100 MG PO TABS: 100.0000 mg | ORAL_TABLET | Freq: Every day | ORAL | 0 refills | Status: DC

## 2021-06-22 MED ORDER — PAROXETINE HCL 40 MG PO TABS: ORAL_TABLET | ORAL | 0 refills | Status: DC

## 2021-06-22 MED ORDER — HYDROCHLOROTHIAZIDE 25 MG PO TABS: ORAL_TABLET | ORAL | 0 refills | Status: DC

## 2021-06-22 MED ORDER — LISINOPRIL 40 MG PO TABS
40.0000 mg | ORAL_TABLET | Freq: Every day | ORAL | 0 refills | Status: DC
Start: 2021-06-22 — End: 2021-10-24

## 2021-06-22 MED ORDER — EST ESTROGENS-METHYLTEST 1.25-2.5 MG PO TABS: 1.0000 | ORAL_TABLET | Freq: Every day | ORAL | 0 refills | Status: DC

## 2021-06-22 MED ORDER — METFORMIN HCL 1000 MG PO TABS: 1000.0000 mg | ORAL_TABLET | Freq: Two times a day (BID) | ORAL | 0 refills | Status: DC

## 2021-06-22 MED ORDER — AMLODIPINE BESYLATE 10 MG PO TABS: ORAL_TABLET | ORAL | 0 refills | Status: DC

## 2021-06-22 NOTE — Telephone Encounter (Signed)
Sent all but controlled substance RXs, please sign other two prescriptions.

## 2021-06-22 NOTE — Telephone Encounter (Signed)
Patient made aware.

## 2021-06-22 NOTE — Telephone Encounter (Signed)
Yes I am for 90 day rx.

## 2021-06-22 NOTE — Telephone Encounter (Signed)
Patient left vm stating she was laid off work and has no insurance. She has applied for Medicare part B. She is asking if we can send 30 day prescriptions on all her medications. Please advise if okay to send? Last appt in December (acute for dog bite in April).

## 2021-09-30 ENCOUNTER — Other Ambulatory Visit: Payer: Self-pay | Admitting: Physician Assistant

## 2021-09-30 DIAGNOSIS — N951 Menopausal and female climacteric states: Secondary | ICD-10-CM

## 2021-10-11 DIAGNOSIS — E119 Type 2 diabetes mellitus without complications: Secondary | ICD-10-CM | POA: Diagnosis not present

## 2021-10-11 DIAGNOSIS — N393 Stress incontinence (female) (male): Secondary | ICD-10-CM | POA: Diagnosis not present

## 2021-10-11 DIAGNOSIS — Z7722 Contact with and (suspected) exposure to environmental tobacco smoke (acute) (chronic): Secondary | ICD-10-CM | POA: Diagnosis not present

## 2021-10-11 DIAGNOSIS — R69 Illness, unspecified: Secondary | ICD-10-CM | POA: Diagnosis not present

## 2021-10-11 DIAGNOSIS — Z79890 Hormone replacement therapy (postmenopausal): Secondary | ICD-10-CM | POA: Diagnosis not present

## 2021-10-11 DIAGNOSIS — F325 Major depressive disorder, single episode, in full remission: Secondary | ICD-10-CM | POA: Diagnosis not present

## 2021-10-11 DIAGNOSIS — Z811 Family history of alcohol abuse and dependence: Secondary | ICD-10-CM | POA: Diagnosis not present

## 2021-10-11 DIAGNOSIS — K219 Gastro-esophageal reflux disease without esophagitis: Secondary | ICD-10-CM | POA: Diagnosis not present

## 2021-10-11 DIAGNOSIS — Z803 Family history of malignant neoplasm of breast: Secondary | ICD-10-CM | POA: Diagnosis not present

## 2021-10-11 DIAGNOSIS — I10 Essential (primary) hypertension: Secondary | ICD-10-CM | POA: Diagnosis not present

## 2021-10-11 DIAGNOSIS — E785 Hyperlipidemia, unspecified: Secondary | ICD-10-CM | POA: Diagnosis not present

## 2021-10-11 DIAGNOSIS — E669 Obesity, unspecified: Secondary | ICD-10-CM | POA: Diagnosis not present

## 2021-10-11 DIAGNOSIS — Z7984 Long term (current) use of oral hypoglycemic drugs: Secondary | ICD-10-CM | POA: Diagnosis not present

## 2021-10-15 ENCOUNTER — Other Ambulatory Visit: Payer: Self-pay | Admitting: Physician Assistant

## 2021-10-15 DIAGNOSIS — F32A Depression, unspecified: Secondary | ICD-10-CM

## 2021-10-24 ENCOUNTER — Other Ambulatory Visit: Payer: Self-pay

## 2021-10-24 ENCOUNTER — Ambulatory Visit (INDEPENDENT_AMBULATORY_CARE_PROVIDER_SITE_OTHER): Payer: Medicare HMO | Admitting: Physician Assistant

## 2021-10-24 VITALS — BP 189/98 | HR 83 | Ht 68.0 in | Wt 198.0 lb

## 2021-10-24 DIAGNOSIS — Z1329 Encounter for screening for other suspected endocrine disorder: Secondary | ICD-10-CM

## 2021-10-24 DIAGNOSIS — E1165 Type 2 diabetes mellitus with hyperglycemia: Secondary | ICD-10-CM

## 2021-10-24 DIAGNOSIS — F32A Depression, unspecified: Secondary | ICD-10-CM

## 2021-10-24 DIAGNOSIS — E782 Mixed hyperlipidemia: Secondary | ICD-10-CM | POA: Diagnosis not present

## 2021-10-24 DIAGNOSIS — N951 Menopausal and female climacteric states: Secondary | ICD-10-CM | POA: Diagnosis not present

## 2021-10-24 DIAGNOSIS — Z79899 Other long term (current) drug therapy: Secondary | ICD-10-CM | POA: Diagnosis not present

## 2021-10-24 DIAGNOSIS — R69 Illness, unspecified: Secondary | ICD-10-CM | POA: Diagnosis not present

## 2021-10-24 DIAGNOSIS — Z1231 Encounter for screening mammogram for malignant neoplasm of breast: Secondary | ICD-10-CM | POA: Diagnosis not present

## 2021-10-24 DIAGNOSIS — I10 Essential (primary) hypertension: Secondary | ICD-10-CM

## 2021-10-24 LAB — POCT GLYCOSYLATED HEMOGLOBIN (HGB A1C): Hemoglobin A1C: 9.3 % — AB (ref 4.0–5.6)

## 2021-10-24 MED ORDER — AMLODIPINE BESYLATE 10 MG PO TABS: ORAL_TABLET | ORAL | 1 refills | Status: DC

## 2021-10-24 MED ORDER — LISINOPRIL 40 MG PO TABS: 40.0000 mg | ORAL_TABLET | Freq: Every day | ORAL | 1 refills | Status: DC

## 2021-10-24 MED ORDER — ATORVASTATIN CALCIUM 10 MG PO TABS: 10.0000 mg | ORAL_TABLET | Freq: Every day | ORAL | 3 refills | Status: DC

## 2021-10-24 MED ORDER — DAPAGLIFLOZIN PROPANEDIOL 10 MG PO TABS: 10.0000 mg | ORAL_TABLET | Freq: Every day | ORAL | 0 refills | Status: DC

## 2021-10-24 MED ORDER — EST ESTROGENS-METHYLTEST 1.25-2.5 MG PO TABS: 1.0000 | ORAL_TABLET | Freq: Every day | ORAL | 3 refills | Status: DC

## 2021-10-24 MED ORDER — PAROXETINE HCL 40 MG PO TABS: 40.0000 mg | ORAL_TABLET | ORAL | 3 refills | Status: DC

## 2021-10-24 NOTE — Progress Notes (Signed)
Subjective:    Patient ID: Kayla Gallagher, female    DOB: 03-13-1951, 70 y.o.   MRN: [id number]  HPI Patient is a 70 year old female with type 2 diabetes, hyperlipidemia, hypertension who presents to the clinic for medication follow-up.  Patient has been taking care of her brother in [place] and has not been coming to her visits here.  She did run out of some of her blood pressure medication and her metformin.  She has not been compliant with diet and exercise either.  She is not checking her sugars.  She denies any hypoglycemic events.  Her last A1c was 7.5.  She denies any chest pain, palpitations, headaches, vision changes or dizziness.   .. Active Ambulatory Problems    Diagnosis Date Noted   History of hepatitis C 01/07/2013   Essential hypertension, benign 01/07/2013   Hyperlipidemia 01/07/2013   History of breast cancer 01/07/2013   Depression 01/07/2013   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   Hyperplastic colon polyp 01/22/2013   Type 2 diabetes mellitus ([place]) 02/21/2013   Breast mass, right 02/21/2013   Actinic keratosis 04/16/2013   Temporal arteritis ([place]) 06/18/2013   Anxiety state 05/04/2015   Obesity 09/09/2015   DDD (degenerative disc disease), cervical 06/22/2017   Acute abdominal pain in left lower quadrant 06/22/2017   Elevated serum creatinine 01/29/2018   Menopausal symptoms 01/29/2018   Metatarsal stress fracture of right foot 01/26/2020   Contusion of rib on left side 08/09/2020   Panic attacks 08/09/2020   Grief reaction 11/10/2020   Uncontrolled type 2 diabetes mellitus with hyperglycemia ([place]) 10/26/2021   Resolved Ambulatory Problems    Diagnosis Date Noted   Strain of elbow, left 06/01/2014   Community acquired pneumonia of right middle lobe of lung 12/06/2017   Past Medical History:  Diagnosis Date   Diabetes mellitus without complication (HCC)    ESPQZRAQ(762.2)    History of anemia    Hypertension      Review of Systems    See HPI.   Objective:   Physical Exam    .. Results for orders placed or performed in visit on 10/24/21  POCT glycosylated hemoglobin (Hb A1C)  Result Value Ref Range   Hemoglobin A1C 9.3 (A) 4.0 - 5.6 %   HbA1c POC (<> result, manual entry)     HbA1c, POC (prediabetic range)     HbA1c, POC (controlled diabetic range)    .Marland Kitchen Depression screen [facility] 2/9 10/24/2021 01/20/2020 10/20/2019 02/28/2019 11/29/2018  Decreased Interest 0 0 0 0 0  Down, Depressed, Hopeless 0 0 1 0 0  PHQ - 2 Score 0 0 1 0 0  Altered sleeping 0 0 0 0 -  Tired, decreased energy 0 1 0 0 -  Change in appetite 0 0 0 0 -  Feeling bad or failure about yourself  0 0 0 0 -  Trouble concentrating 0 0 0 0 -  Moving slowly or fidgety/restless 0 0 0 0 -  Suicidal thoughts 0 0 0 0 -  PHQ-9 Score 0 1 1 0 -  Difficult doing work/chores Not difficult at all - Not difficult at all Not difficult at all -   .. GAD 7 : Generalized Anxiety Score 10/24/2021 01/20/2020 10/20/2019 02/28/2019  Nervous, Anxious, on Edge 0 0 0 0  Control/stop worrying 0 0 0 0  Worry too much - different things 0 0 0 0  Trouble relaxing 0 0 0 0  Restless 0 1 0 0  Easily annoyed or  irritable 0 1 1 0  Afraid - awful might happen 0 0 0 0  Total GAD 7 Score 0 2 1 0  Anxiety Difficulty Not difficult at all - Not difficult at all Not difficult at all         Assessment & Plan:  [name] KitchenCaleen was seen today for diabetes.  Diagnoses and all orders for this visit:  Uncontrolled type 2 diabetes mellitus with hyperglycemia ([place]) -     POCT glycosylated hemoglobin (Hb A1C) -     COMPLETE METABOLIC PANEL WITH GFR -     dapagliflozin propanediol (FARXIGA) 10 MG TABS tablet; Take 1 tablet (10 mg total) by mouth daily.  Essential hypertension, benign -     COMPLETE METABOLIC PANEL WITH GFR -     lisinopril (ZESTRIL) 40 MG tablet; Take 1 tablet (40 mg total) by mouth daily. Take 1 tablet (40 mg total) by mouth daily. -     amLODipine (NORVASC) 10 MG tablet; TAKE 1 TABLET BY  MOUTH EVERY DAY  Mixed hyperlipidemia -     Lipid Panel w/reflex Direct LDL -     atorvastatin (LIPITOR) 10 MG tablet; Take 1 tablet (10 mg total) by mouth daily.  Thyroid disorder screen -     TSH  Medication management -     POCT glycosylated hemoglobin (Hb A1C) -     TSH -     Lipid Panel w/reflex Direct LDL -     COMPLETE METABOLIC PANEL WITH GFR -     CBC with Differential/Platelet  Menopausal symptoms -     estrogens-methylTEST (ESTRATEST) 1.25-2.5 MG tablet; Take 1 tablet by mouth daily.  Depression, unspecified depression type -     PARoxetine (PAXIL) 40 MG tablet; Take 1 tablet (40 mg total) by mouth every morning.  Visit for screening mammogram -     MM 3D SCREEN BREAST BILATERAL  Pt has not been taking medication. Long discussion on how important it is.   A1C not good and way up from last check.  Refilled metformin and added farxiga.  Discussed diet control.  BP not to goal. Restart medication and recheck in 2 weeks nurse visit.  Refills sent of statin.  Needs eye and foot exam.  Declines covid, flu, pneumonia, shingrix vaccines.   PHQ/GAD numbers look great. [place] for refills.   HRT refilled estrotest. Mammogram ordered.

## 2021-10-26 ENCOUNTER — Ambulatory Visit (INDEPENDENT_AMBULATORY_CARE_PROVIDER_SITE_OTHER): Payer: Medicare HMO

## 2021-10-26 ENCOUNTER — Other Ambulatory Visit: Payer: Self-pay

## 2021-10-26 ENCOUNTER — Encounter: Payer: Self-pay | Admitting: Physician Assistant

## 2021-10-26 DIAGNOSIS — E1165 Type 2 diabetes mellitus with hyperglycemia: Secondary | ICD-10-CM | POA: Insufficient documentation

## 2021-10-26 DIAGNOSIS — Z1231 Encounter for screening mammogram for malignant neoplasm of breast: Secondary | ICD-10-CM | POA: Diagnosis not present

## 2021-10-27 NOTE — Progress Notes (Signed)
Normal mammogram follow up in 1 year.

## 2021-11-10 ENCOUNTER — Other Ambulatory Visit: Payer: Self-pay

## 2021-11-10 ENCOUNTER — Ambulatory Visit (INDEPENDENT_AMBULATORY_CARE_PROVIDER_SITE_OTHER): Payer: Medicare HMO | Admitting: Family Medicine

## 2021-11-10 VITALS — BP 139/83 | HR 92

## 2021-11-10 DIAGNOSIS — I10 Essential (primary) hypertension: Secondary | ICD-10-CM

## 2021-11-10 MED ORDER — METOPROLOL SUCCINATE ER 25 MG PO TB24: 25.0000 mg | ORAL_TABLET | Freq: Every day | ORAL | 0 refills | Status: DC

## 2021-11-10 NOTE — Progress Notes (Signed)
She states she took herself off the medication a few months ago. She states she has bladder problems, leaking, and doesn't want to take the medication.

## 2021-11-10 NOTE — Progress Notes (Signed)
Blood pressure still little borderline today but significantly better.  She just needs to make sure that she is taking her HCTZ regularly.  Based on her refills for her HCTZ she would have run out about a month ago so I am can go ahead and refill that medication she just needs to make sure she is taking lisinopril, HCTZ, and amlodipine for her blood pressure control. They do make a combo pill with all 3 type of medicine in one, called Tribenzor, if she would like.

## 2021-11-10 NOTE — Addendum Note (Signed)
Addended by: Nani Gasser D on: 11/10/2021 05:43 PM   Modules accepted: Orders

## 2021-11-10 NOTE — Progress Notes (Signed)
Established Patient Office Visit  Subjective:  Patient ID: Kayla Gallagher, female    DOB: 1951-09-23  Age: 70 y.o. MRN: [id number]  CC:  Chief Complaint  Patient presents with   Hypertension    HPI [name] presents for blood pressure check.   Past Medical History:  Diagnosis Date   Depression    Diabetes mellitus without complication (HCC)    Essential hypertension, benign 01/07/2013   Headache(784.0)    History of anemia    History of hepatitis C 01/07/2013   Hyperlipidemia    Hypertension    Does not see cardiology    Past Surgical History:  Procedure Laterality Date   ABDOMINAL HYSTERECTOMY  1976   ABDOMINAL HYSTERECTOMY     ARTERY BIOPSY Right 06/30/2013   Procedure: BIOPSY TEMPORAL ARTERY;  Surgeon: Larina Earthly, MD;  Location: [facility] OR;  Service: Vascular;  Laterality: Right;   BREAST BIOPSY Right    BREAST REDUCTION SURGERY     BUNIONECTOMY     REDUCTION MAMMAPLASTY     tummy tuck      Family History  Problem Relation Age of Onset   Heart attack Father    Diabetes Father        grandmother   Cancer Father    Heart disease Father    Hyperlipidemia Father    Hypertension Father    Cancer Mother    Alcoholism Other        parents   Prostate cancer Other        grandfather   Hyperlipidemia Other        grandfather   Breast cancer Sister     Social History   Socioeconomic History   Marital status: Single    Spouse name: Not on file   Number of children: Not on file   Years of education: Not on file   Highest education level: Not on file  Occupational History   Not on file  Tobacco Use   Smoking status: Never   Smokeless tobacco: Never  Vaping Use   Vaping Use: Never used  Substance and Sexual Activity   Alcohol use: No   Drug use: No   Sexual activity: Not Currently  Other Topics Concern   Not on file  Social History Narrative   Not on file   Social Determinants of Health   Financial Resource Strain: Not on file  Food  Insecurity: Not on file  Transportation Needs: Not on file  Physical Activity: Not on file  Stress: Not on file  Social Connections: Not on file  Intimate Partner Violence: Not on file    Outpatient Medications Prior to Visit  Medication Sig Dispense Refill   ALPRAZolam (XANAX) 0.5 MG tablet Take 1 tablet (0.5 mg total) by mouth at bedtime as needed for anxiety. 90 tablet 0   amLODipine (NORVASC) 10 MG tablet TAKE 1 TABLET BY MOUTH EVERY DAY 90 tablet 1   atorvastatin (LIPITOR) 10 MG tablet Take 1 tablet (10 mg total) by mouth daily. 90 tablet 3   Biotin 10 MG TABS Take by mouth.     dapagliflozin propanediol (FARXIGA) 10 MG TABS tablet Take 1 tablet (10 mg total) by mouth daily. 90 tablet 0   estrogens-methylTEST (ESTRATEST) 1.25-2.5 MG tablet Take 1 tablet by mouth daily. 90 tablet 3   hydrochlorothiazide (HYDRODIURIL) 25 MG tablet TAKE 1 TABLET BY MOUTH DAILY FOR BLOOD PRESSURE CONTROL. (Patient not taking: Reported on 10/24/2021) 90 tablet 0   ibuprofen (  ADVIL) 800 MG tablet Take by mouth.     lisinopril (ZESTRIL) 40 MG tablet Take 1 tablet (40 mg total) by mouth daily. Take 1 tablet (40 mg total) by mouth daily. 90 tablet 1   metFORMIN (GLUCOPHAGE) 1000 MG tablet Take 1 tablet (1,000 mg total) by mouth 2 (two) times daily with a meal. TAKE 1 TABLET (1,000 MG TOTAL) BY MOUTH 2 (TWO) TIMES DAILY WITH A MEAL. (Patient not taking: Reported on 10/24/2021) 180 tablet 0   Multiple Vitamins-Minerals (MULTIVITAMIN PO) Take 1 tablet by mouth daily.     PARoxetine (PAXIL) 40 MG tablet Take 1 tablet (40 mg total) by mouth every morning. 90 tablet 3   No facility-administered medications prior to visit.    Allergies  Allergen Reactions   Morphine And Related Nausea And Vomiting    ROS Review of Systems    Objective:    Physical Exam  BP 139/83    Pulse 92  Wt Readings from Last 3 Encounters:  10/24/21 198 lb (89.8 kg)  03/08/21 211 lb (95.7 kg)  11/10/20 208 lb (94.3 kg)      Health Maintenance Due  Topic Date Due   COVID-19 Vaccine (1) Never done   FOOT EXAM  01/19/2021   OPHTHALMOLOGY EXAM  02/03/2021    There are no preventive care reminders to display for this patient.  Lab Results  Component Value Date   TSH 2.967 06/17/2013   Lab Results  Component Value Date   WBC 7.9 12/27/2017   HGB 11.5 (L) 12/27/2017   HCT 34.9 (L) 12/27/2017   MCV 87.5 12/27/2017   PLT 323 12/27/2017   Lab Results  Component Value Date   NA 137 11/10/2020   K 4.6 11/10/2020   CO2 27 11/10/2020   GLUCOSE 150 (H) 11/10/2020   BUN 18 11/10/2020   CREATININE 1.00 (H) 11/10/2020   BILITOT 0.3 11/10/2020   ALKPHOS 61 06/22/2017   AST 17 11/10/2020   ALT 19 11/10/2020   PROT 7.6 11/10/2020   ALBUMIN 4.1 06/22/2017   CALCIUM 10.1 11/10/2020   Lab Results  Component Value Date   CHOL 153 11/10/2020   Lab Results  Component Value Date   HDL 29 (L) 11/10/2020   Lab Results  Component Value Date   LDLCALC 91 11/10/2020   Lab Results  Component Value Date   TRIG 253 (H) 11/10/2020   Lab Results  Component Value Date   CHOLHDL 5.3 (H) 11/10/2020   Lab Results  Component Value Date   HGBA1C 9.3 (A) 10/24/2021      Assessment & Plan:  HTN - Patient's blood pressure was within normal limits. Advised to continue current medications and follow up in 3 months with Jade.   Problem List Items Addressed This Visit     Essential hypertension, benign - Primary (Chronic)    No orders of the defined types were placed in this encounter.   Follow-up: Return in about 3 months (around 02/08/2022) for HTN and DM with Jade. Earna Coder, Janalyn Harder, CMA

## 2021-11-10 NOTE — Progress Notes (Addendum)
Okay, but is not a problem at all.  We will go ahead and take it off of her medication list completely.  And I am get a send over something that is not a diuretic in its place so we will try that and then have her come back to meet with her PCP or do a nurse visit in about 2 to 3 weeks to see if we can get the blood pressure just a little bit better.  Meds ordered this encounter  Medications   metoprolol succinate (TOPROL-XL) 25 MG 24 hr tablet    Sig: Take 1 tablet (25 mg total) by mouth daily.    Dispense:  30 tablet    Refill:  0

## 2021-11-22 NOTE — Progress Notes (Signed)
Patient advised of recommendations.  

## 2021-11-25 ENCOUNTER — Telehealth: Payer: Self-pay | Admitting: Neurology

## 2021-11-25 NOTE — Telephone Encounter (Signed)
Patient left a vm stating she wanted to discuss her Metoprolol and Farxiga with Lesly Rubenstein, also wants to see about getting a Dexcom. She is currently in [place]. Can we call patient at [phone] to make a virtual visit with Lesly Rubenstein to discuss multiple concerns?

## 2021-11-25 NOTE — Telephone Encounter (Signed)
Pt has been scheduled for a virtual visit for Monday @ 320pm with PCP.

## 2021-11-28 ENCOUNTER — Other Ambulatory Visit: Payer: Self-pay

## 2021-11-28 ENCOUNTER — Telehealth (INDEPENDENT_AMBULATORY_CARE_PROVIDER_SITE_OTHER): Payer: Medicare HMO | Admitting: Physician Assistant

## 2021-11-28 DIAGNOSIS — Z91199 Patient's noncompliance with other medical treatment and regimen due to unspecified reason: Secondary | ICD-10-CM

## 2021-11-28 NOTE — Progress Notes (Signed)
No answer.  LM 3:40.

## 2021-11-30 ENCOUNTER — Encounter: Payer: Self-pay | Admitting: Physician Assistant

## 2021-11-30 ENCOUNTER — Telehealth (INDEPENDENT_AMBULATORY_CARE_PROVIDER_SITE_OTHER): Payer: Medicare HMO | Admitting: Physician Assistant

## 2021-11-30 DIAGNOSIS — E1165 Type 2 diabetes mellitus with hyperglycemia: Secondary | ICD-10-CM | POA: Diagnosis not present

## 2021-11-30 DIAGNOSIS — I10 Essential (primary) hypertension: Secondary | ICD-10-CM

## 2021-11-30 DIAGNOSIS — E782 Mixed hyperlipidemia: Secondary | ICD-10-CM

## 2021-11-30 MED ORDER — METFORMIN HCL 1000 MG PO TABS: 1000.0000 mg | ORAL_TABLET | Freq: Two times a day (BID) | ORAL | 0 refills | Status: DC

## 2021-11-30 MED ORDER — METOPROLOL SUCCINATE ER 25 MG PO TB24: 25.0000 mg | ORAL_TABLET | Freq: Every day | ORAL | 0 refills | Status: DC

## 2021-11-30 MED ORDER — SITAGLIPTIN PHOSPHATE 100 MG PO TABS: 100.0000 mg | ORAL_TABLET | Freq: Every day | ORAL | 0 refills | Status: DC

## 2021-11-30 MED ORDER — FREESTYLE LIBRE 14 DAY SENSOR MISC: 1.0000 "application " | 5 refills | Status: DC

## 2021-11-30 NOTE — Progress Notes (Signed)
Pt would like to discuss  her metoprolol succinate (TOPROL-XL) 25 MG she states she was on on given a 30 day  and no refill.Pt state she experiencing dry mouth on dapagliflozin propanediol (FARXIGA) 10 MG TABS  also she [place] in [place]

## 2021-11-30 NOTE — Progress Notes (Signed)
Patient ID: [name], female   DOB: 1951/05/31, 71 y.o.   MRN: [id number] .Marland KitchenVirtual Visit via Video Note  I connected with Kayla Gallagher on 11/30/21 at 10:30 AM EST by a video enabled telemedicine application and verified that I am speaking with the correct person using two identifiers.  Location: Patient: Kayla Gallagher Provider: clinic  .Marland KitchenParticipating in visit:  Patient: Kayla Gallagher Provider: Iran Planas PA-C Provider in training: Purvis Sheffield PA-S      I discussed the limitations of evaluation and management by telemedicine and the availability of in person appointments. The patient expressed understanding and agreed to proceed.  History of Present Illness: Pt is a 71 yo female HTN, T2DM, HLD who calls to get medication refills.   Checking BP and ranging in the 130s over 80s. No CP, palpitations, headaches, vision changes.   Farxiga really dry mouth. Wants to stop it. Not taking metformin. Not checking sugars. No hypoglycemia. No open wounds.   .. Active Ambulatory Problems    Diagnosis Date Noted   History of hepatitis C 01/07/2013   Essential hypertension, benign 01/07/2013   Mixed hyperlipidemia 01/07/2013   History of breast cancer 01/07/2013   Depression 01/07/2013   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   Hyperplastic colon polyp 01/22/2013   Type 2 diabetes mellitus ([place]) 02/21/2013   Breast mass, right 02/21/2013   Actinic keratosis 04/16/2013   Temporal arteritis ([place]) 06/18/2013   Anxiety state 05/04/2015   Obesity 09/09/2015   DDD (degenerative disc disease), cervical 06/22/2017   Acute abdominal pain in left lower quadrant 06/22/2017   Elevated serum creatinine 01/29/2018   Menopausal symptoms 01/29/2018   Metatarsal stress fracture of right foot 01/26/2020   Contusion of rib on left side 08/09/2020   Panic attacks 08/09/2020   Grief reaction 11/10/2020   Uncontrolled type 2 diabetes mellitus with hyperglycemia ([place]) 10/26/2021   Resolved Ambulatory Problems     Diagnosis Date Noted   Strain of elbow, left 06/01/2014   Community acquired pneumonia of right middle lobe of lung 12/06/2017   Past Medical History:  Diagnosis Date   Diabetes mellitus without complication (HCC)    YTWKMQKM(638.1)    History of anemia    Hyperlipidemia    Hypertension       Observations/Objective: No acute distress Normal breathing Normal mood and appearance   Assessment and Plan: [name] KitchenDiagnoses and all orders for this visit:  Uncontrolled type 2 diabetes mellitus with hyperglycemia ([place]) -     metFORMIN (GLUCOPHAGE) 1000 MG tablet; Take 1 tablet (1,000 mg total) by mouth 2 (two) times daily with a meal. TAKE 1 TABLET (1,000 MG TOTAL) BY MOUTH 2 (TWO) TIMES DAILY WITH A MEAL. -     Continuous Blood Gluc Sensor (FREESTYLE LIBRE 14 DAY SENSOR) MISC; 1 application by Does not apply route every 14 (fourteen) days. Apply upper deltoid every 14 days, use reader to determine blood sugars -     sitaGLIPtin (JANUVIA) 100 MG tablet; Take 1 tablet (100 mg total) by mouth daily.  Essential hypertension, benign -     metoprolol succinate (TOPROL-XL) 25 MG 24 hr tablet; Take 1 tablet (25 mg total) by mouth daily.  Mixed hyperlipidemia   Per pt BP controlled. Refilled toprol.  Stop farxiga. Start jardiance and metformin. Start using libre to check sugars to chart progress.  Discussed DM diet and importance of regular exercise.  Follow up in 3 months.      Follow Up Instructions:    I discussed the assessment and  treatment plan with the patient. The patient was provided an opportunity to ask questions and all were answered. The patient agreed with the plan and demonstrated an understanding of the instructions.   The patient was advised to call back or seek an in-person evaluation if the symptoms worsen or if the condition fails to improve as anticipated.   Iran Planas, PA-C

## 2021-12-04 ENCOUNTER — Other Ambulatory Visit: Payer: Self-pay | Admitting: Family Medicine

## 2021-12-04 DIAGNOSIS — I10 Essential (primary) hypertension: Secondary | ICD-10-CM

## 2021-12-27 IMAGING — MG MM DIGITAL SCREENING BILAT W/ TOMO AND CAD
8 series · 8 of 24 positions shown · non-contrast
Comparison: Previous exam(s).

CLINICAL DATA: Screening.

EXAM:
DIGITAL SCREENING BILATERAL MAMMOGRAM WITH TOMOSYNTHESIS AND CAD
TECHNIQUE: Bilateral screening digital craniocaudal and mediolateral oblique
mammograms were obtained. Bilateral screening digital breast
tomosynthesis was performed. The images were evaluated with
computer-aided detection.

[R CC synth-2D]
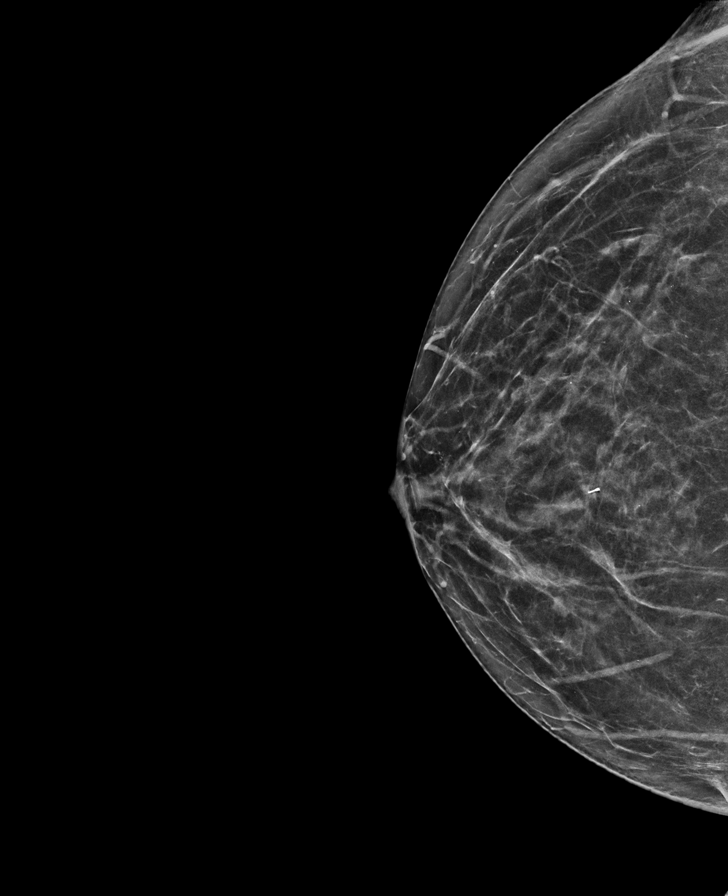

[R MLO synth-2D]
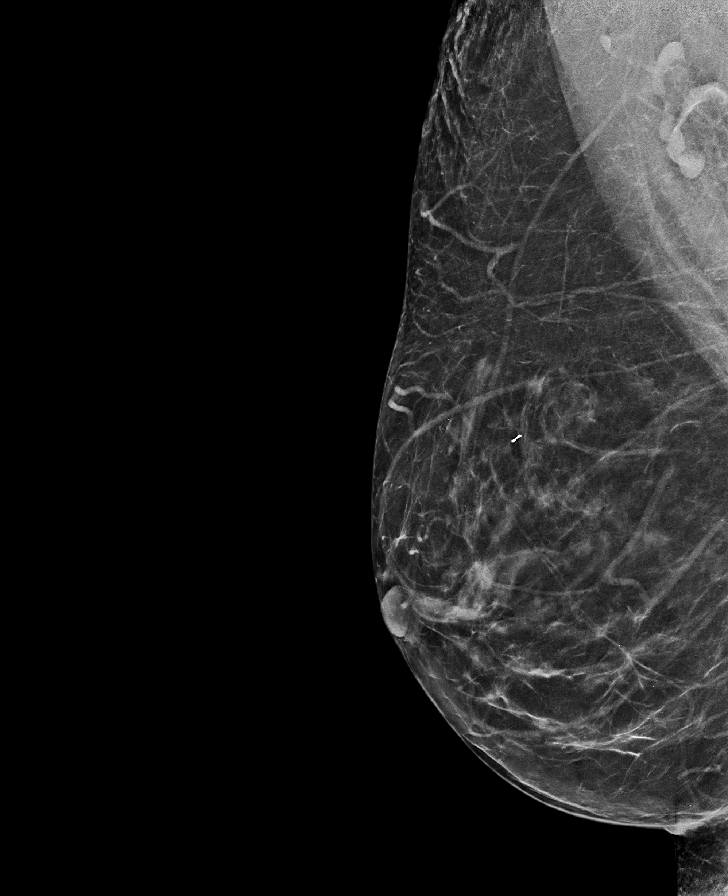

[L CC synth-2D]
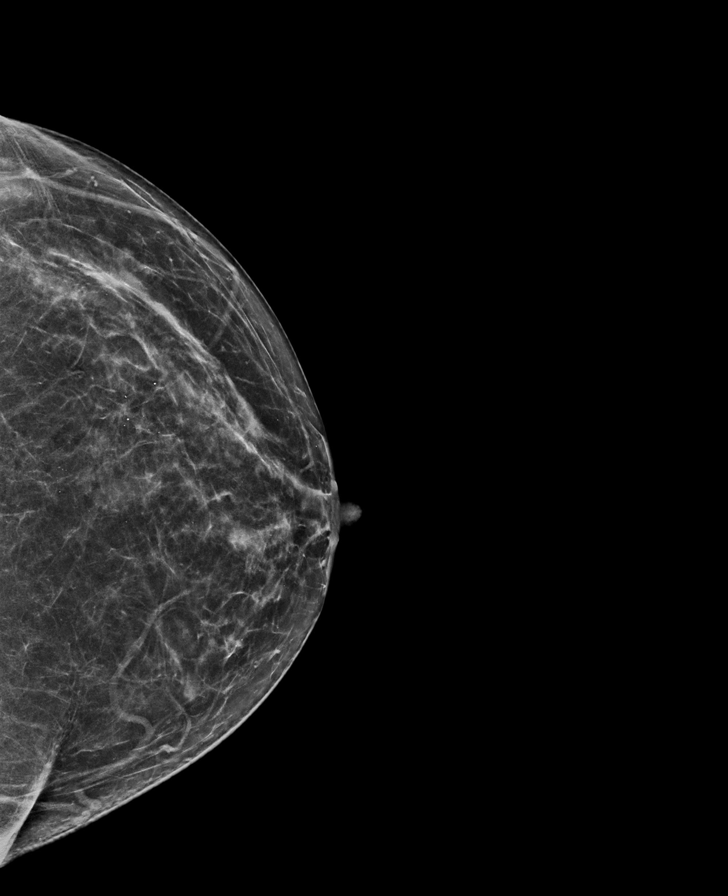

[L MLO synth-2D]
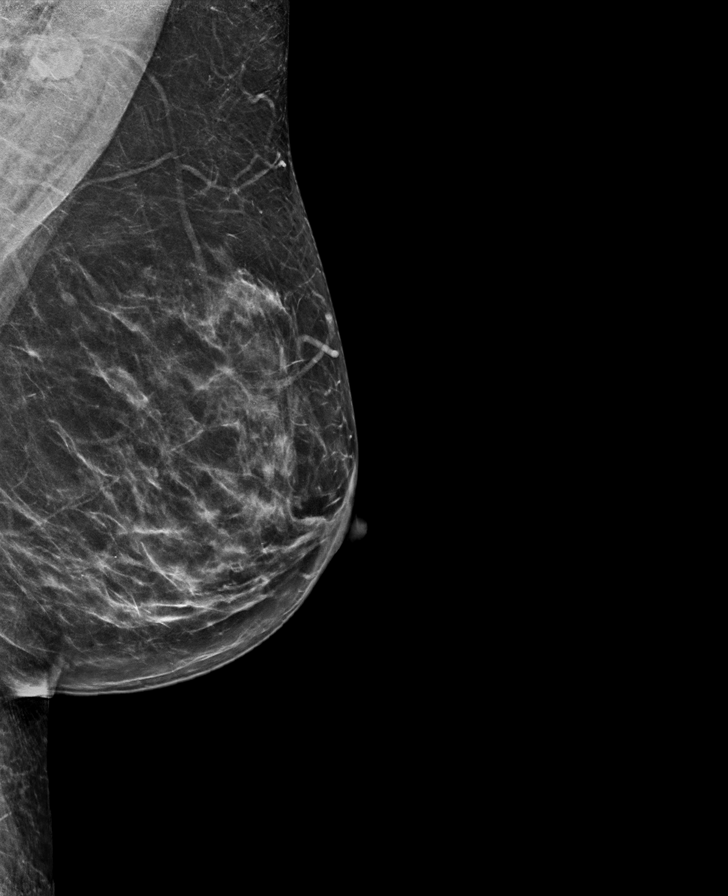

[R MLO tomo · tomo slice 37/74.0]
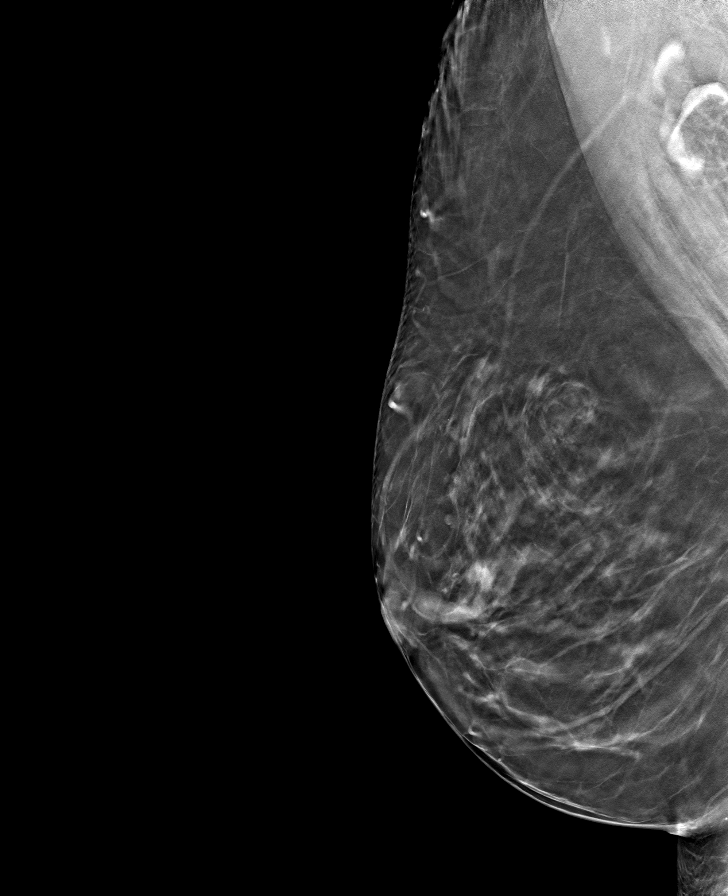

[L CC tomo · tomo slice 35/68.0]
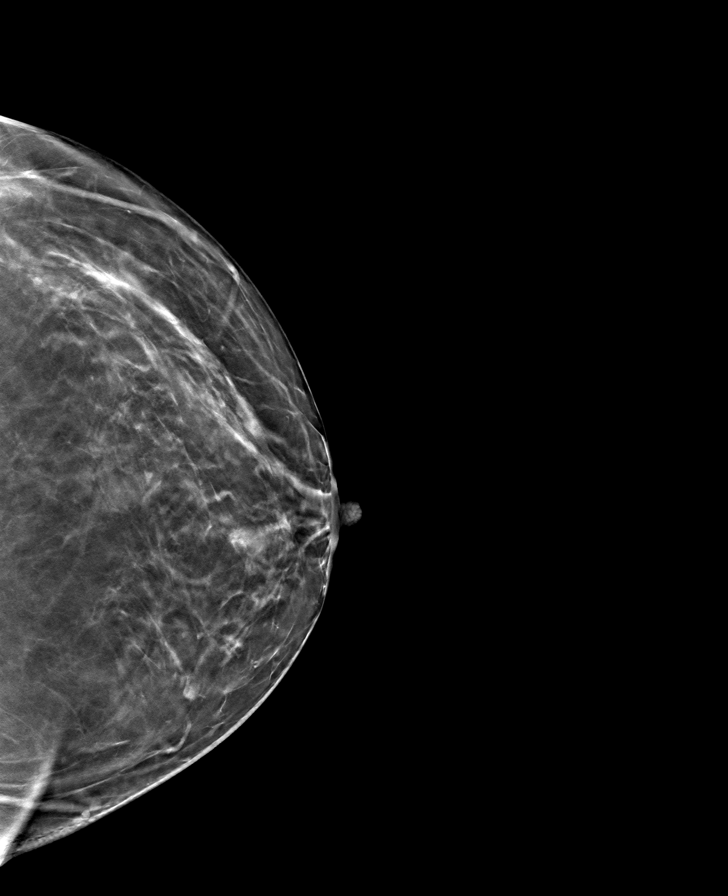

[L MLO tomo · tomo slice 37/73.0]
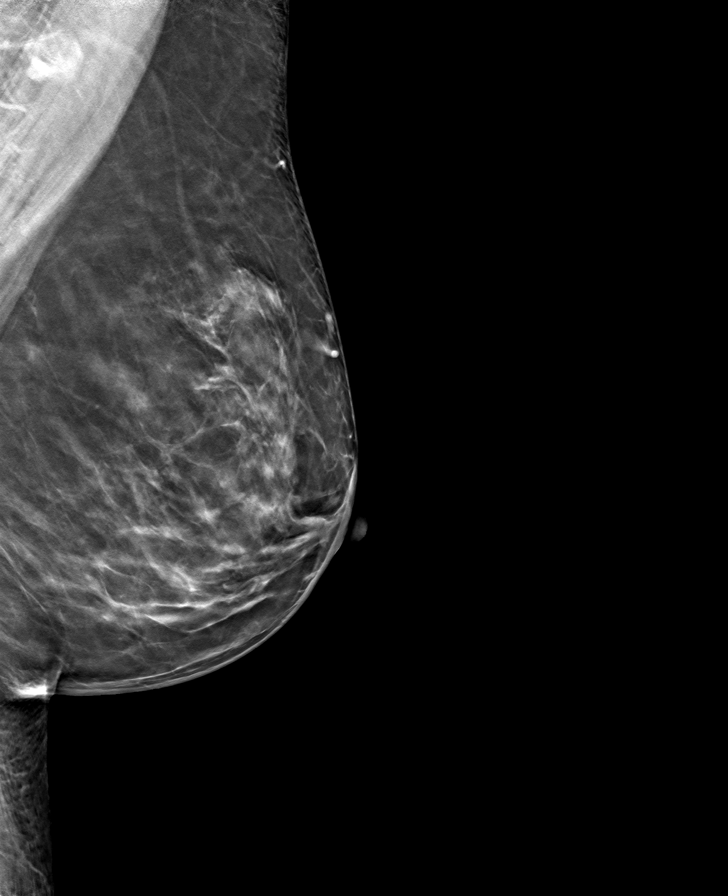

[R CC tomo · tomo slice 33/66.0]
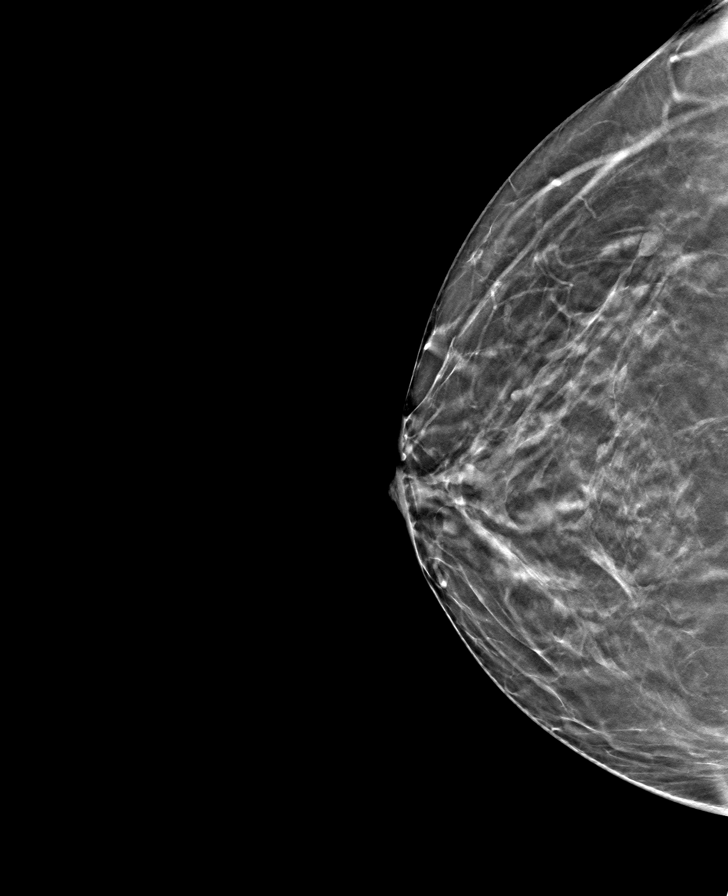

[8 of 24 positions shown; findings below may reference images not displayed]

ACR Breast Density Category b: There are scattered areas of
fibroglandular density.
FINDINGS: There are no findings suspicious for malignancy.
IMPRESSION: No mammographic evidence of malignancy. A result letter of this
screening mammogram will be mailed directly to the patient.

RECOMMENDATION:
Screening mammogram in one year. (Code:51-O-LD2)

BI-RADS CATEGORY  1: Negative.

## 2022-02-07 ENCOUNTER — Ambulatory Visit: Payer: Medicare HMO | Admitting: Physician Assistant

## 2022-02-13 ENCOUNTER — Ambulatory Visit (INDEPENDENT_AMBULATORY_CARE_PROVIDER_SITE_OTHER): Payer: Medicare HMO | Admitting: Physician Assistant

## 2022-02-13 ENCOUNTER — Encounter: Payer: Self-pay | Admitting: Physician Assistant

## 2022-02-13 VITALS — BP 129/79 | HR 77 | Resp 16 | Ht 68.0 in | Wt 191.0 lb

## 2022-02-13 DIAGNOSIS — R69 Illness, unspecified: Secondary | ICD-10-CM | POA: Diagnosis not present

## 2022-02-13 DIAGNOSIS — F32A Depression, unspecified: Secondary | ICD-10-CM | POA: Diagnosis not present

## 2022-02-13 DIAGNOSIS — E1165 Type 2 diabetes mellitus with hyperglycemia: Secondary | ICD-10-CM

## 2022-02-13 DIAGNOSIS — N951 Menopausal and female climacteric states: Secondary | ICD-10-CM

## 2022-02-13 DIAGNOSIS — I10 Essential (primary) hypertension: Secondary | ICD-10-CM | POA: Diagnosis not present

## 2022-02-13 DIAGNOSIS — E782 Mixed hyperlipidemia: Secondary | ICD-10-CM

## 2022-02-13 LAB — POCT GLYCOSYLATED HEMOGLOBIN (HGB A1C): Hemoglobin A1C: 7.6 % — AB (ref 4.0–5.6)

## 2022-02-13 MED ORDER — AMLODIPINE BESYLATE 10 MG PO TABS: ORAL_TABLET | ORAL | 1 refills | Status: DC

## 2022-02-13 MED ORDER — PAROXETINE HCL 40 MG PO TABS: 40.0000 mg | ORAL_TABLET | ORAL | 3 refills | Status: DC

## 2022-02-13 MED ORDER — ATORVASTATIN CALCIUM 10 MG PO TABS: 10.0000 mg | ORAL_TABLET | Freq: Every day | ORAL | 3 refills | Status: DC

## 2022-02-13 MED ORDER — SITAGLIPTIN PHOSPHATE 100 MG PO TABS: 100.0000 mg | ORAL_TABLET | Freq: Every day | ORAL | 0 refills | Status: DC

## 2022-02-13 MED ORDER — EST ESTROGENS-METHYLTEST 1.25-2.5 MG PO TABS: 1.0000 | ORAL_TABLET | Freq: Every day | ORAL | 3 refills | Status: DC

## 2022-02-13 MED ORDER — LISINOPRIL 40 MG PO TABS: 40.0000 mg | ORAL_TABLET | Freq: Every day | ORAL | 1 refills | Status: DC

## 2022-02-13 MED ORDER — METOPROLOL SUCCINATE ER 25 MG PO TB24: 25.0000 mg | ORAL_TABLET | Freq: Every day | ORAL | 0 refills | Status: DC

## 2022-02-13 MED ORDER — METFORMIN HCL 1000 MG PO TABS: 1000.0000 mg | ORAL_TABLET | Freq: Two times a day (BID) | ORAL | 0 refills | Status: DC

## 2022-02-13 NOTE — Patient Instructions (Signed)
[facility] in [place] ?

## 2022-02-13 NOTE — Progress Notes (Signed)
? ?Subjective:  ? ? Patient ID: Kayla Gallagher, female    DOB: 11-Feb-1951, 71 y.o.   MRN: [id number] ? ?HPI ?Pt is a 71 yo female with HTN, T2DM, HLD, anxiety, panic attacks, menopausal symptoms who presents to the clinic for follow up.  ? ?Pt is doing great at watching her diet and walking. She has lost about 10lbs over the last 3 months. She is checking her sugars more with libre and noting what causes the glucose increase. Morning sugars are 100-140. No open wounds. No hypoglycemic events. Compliant with medication.  ? ?Denies any CP, palpitations, headaches, vision changes.  ? ?Mood is good. No concerns.  ? ?Menopausal symptoms controlled on medication.  ? ? ?.. ?Active Ambulatory Problems  ?  Diagnosis Date Noted  ? History of hepatitis C 01/07/2013  ? Essential hypertension, benign 01/07/2013  ? Mixed hyperlipidemia 01/07/2013  ? History of breast cancer 01/07/2013  ? Depression 01/07/2013  ? 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  ? Hyperplastic colon polyp 01/22/2013  ? Type 2 diabetes mellitus ([name]) 02/21/2013  ? Breast mass, right 02/21/2013  ? Actinic keratosis 04/16/2013  ? Temporal arteritis ([name]) 06/18/2013  ? Anxiety state 05/04/2015  ? Obesity 09/09/2015  ? DDD (degenerative disc disease), cervical 06/22/2017  ? Acute abdominal pain in left lower quadrant 06/22/2017  ? Elevated serum creatinine 01/29/2018  ? Menopausal symptoms 01/29/2018  ? Metatarsal stress fracture of right foot 01/26/2020  ? Contusion of rib on left side 08/09/2020  ? Panic attacks 08/09/2020  ? Grief reaction 11/10/2020  ? Uncontrolled type 2 diabetes mellitus with hyperglycemia ([place]) 10/26/2021  ? ?Resolved Ambulatory Problems  ?  Diagnosis Date Noted  ? Strain of elbow, left 06/01/2014  ? Community acquired pneumonia of right middle lobe of lung 12/06/2017  ? ?Past Medical History:  ?Diagnosis Date  ? Diabetes mellitus without complication ([name])   ? Headache(784.0)   ? History of anemia   ? Hyperlipidemia   ? Hypertension   ? ? ?Review of  Systems  ?All other systems reviewed and are negative. ? ?   ?Objective:  ? Physical Exam ?Vitals reviewed.  ?Constitutional:   ?   Appearance: Normal appearance. She is obese.  ?HENT:  ?   Head: Normocephalic.  ?Neck:  ?   Vascular: No carotid bruit.  ?Cardiovascular:  ?   Rate and Rhythm: Normal rate and regular rhythm.  ?Pulmonary:  ?   Effort: Pulmonary effort is normal.  ?   Breath sounds: Normal breath sounds.  ?Musculoskeletal:  ?   Right lower leg: No edema.  ?   Left lower leg: No edema.  ?Neurological:  ?   General: No focal deficit present.  ?   Mental Status: She is alert and oriented to person, place, and time.  ?Psychiatric:     ?   Mood and Affect: Mood normal.  ? ? ? ? ?.. ?Results for orders placed or performed in visit on 02/13/22  ?POCT glycosylated hemoglobin (Hb A1C)  ?Result Value Ref Range  ? Hemoglobin A1C 7.6 (A) 4.0 - 5.6 %  ? HbA1c POC (<> result, manual entry)    ? HbA1c, POC (prediabetic range)    ? HbA1c, POC (controlled diabetic range)    ? ? ?   ?Assessment & Plan:  ?..Kimm was seen today for diabetes. ? ?Diagnoses and all orders for this visit: ? ?Uncontrolled type 2 diabetes mellitus with hyperglycemia (HCC) ?-     POCT glycosylated hemoglobin (Hb A1C) ?-  metFORMIN (GLUCOPHAGE) 1000 MG tablet; Take 1 tablet (1,000 mg total) by mouth 2 (two) times daily with a meal. TAKE 1 TABLET (1,000 MG TOTAL) BY MOUTH 2 (TWO) TIMES DAILY WITH A MEAL. ?-     sitaGLIPtin (JANUVIA) 100 MG tablet; Take 1 tablet (100 mg total) by mouth daily. ? ?Essential hypertension, benign ?-     metoprolol succinate (TOPROL-XL) 25 MG 24 hr tablet; Take 1 tablet (25 mg total) by mouth daily. ?-     lisinopril (ZESTRIL) 40 MG tablet; Take 1 tablet (40 mg total) by mouth daily. Take 1 tablet (40 mg total) by mouth daily. ?-     amLODipine (NORVASC) 10 MG tablet; TAKE 1 TABLET BY MOUTH EVERY DAY ? ?Depression, unspecified depression type ?-     PARoxetine (PAXIL) 40 MG tablet; Take 1 tablet (40 mg total) by  mouth every morning. ? ?Mixed hyperlipidemia ?-     atorvastatin (LIPITOR) 10 MG tablet; Take 1 tablet (10 mg total) by mouth daily. ? ?Menopausal symptoms ?-     estrogens-methylTEST (ESTRATEST) 1.25-2.5 MG tablet; Take 1 tablet by mouth daily. ? ? ?A1C not quite to goal but down from last visit. ?Pt will continue to make diet and lifestyle changes. ?Continue on metformin and januvia. ?BP to goal, on ACE and CCB.      ?On statin ?Needs eye exam ?.. ?Diabetic Foot Exam - Simple   ?Simple Foot Form ?Diabetic Foot exam was performed with the following findings: Yes 02/13/2022  1:26 PM  ?Visual Inspection ?No deformities, no ulcerations, no other skin breakdown bilaterally: Yes ?Sensation Testing ?Intact to touch and monofilament testing bilaterally: Yes ?Pulse Check ?Posterior Tibialis and Dorsalis pulse intact bilaterally: Yes ?Comments ?  ? ?Declines covid/flu/shingles/pneumonia vaccine. ?Follow up in 3 months.  ? ?PHQ/GAD stable.  ?Refilled paxil.  ? ? ?

## 2022-02-20 ENCOUNTER — Other Ambulatory Visit: Payer: Self-pay | Admitting: Physician Assistant

## 2022-02-20 DIAGNOSIS — E1165 Type 2 diabetes mellitus with hyperglycemia: Secondary | ICD-10-CM

## 2022-03-08 ENCOUNTER — Other Ambulatory Visit: Payer: Self-pay | Admitting: Physician Assistant

## 2022-03-08 DIAGNOSIS — I10 Essential (primary) hypertension: Secondary | ICD-10-CM

## 2022-05-16 ENCOUNTER — Other Ambulatory Visit: Payer: Self-pay | Admitting: Physician Assistant

## 2022-05-16 DIAGNOSIS — E1165 Type 2 diabetes mellitus with hyperglycemia: Secondary | ICD-10-CM

## 2022-05-16 DIAGNOSIS — I10 Essential (primary) hypertension: Secondary | ICD-10-CM

## 2022-05-17 ENCOUNTER — Encounter: Payer: Self-pay | Admitting: Physician Assistant

## 2022-05-17 ENCOUNTER — Ambulatory Visit (INDEPENDENT_AMBULATORY_CARE_PROVIDER_SITE_OTHER): Payer: Medicare HMO | Admitting: Physician Assistant

## 2022-05-17 VITALS — BP 122/67 | HR 77 | Resp 18 | Ht 68.0 in | Wt 190.0 lb

## 2022-05-17 DIAGNOSIS — N951 Menopausal and female climacteric states: Secondary | ICD-10-CM

## 2022-05-17 DIAGNOSIS — E118 Type 2 diabetes mellitus with unspecified complications: Secondary | ICD-10-CM

## 2022-05-17 DIAGNOSIS — E1165 Type 2 diabetes mellitus with hyperglycemia: Secondary | ICD-10-CM

## 2022-05-17 DIAGNOSIS — F410 Panic disorder [episodic paroxysmal anxiety] without agoraphobia: Secondary | ICD-10-CM

## 2022-05-17 DIAGNOSIS — R69 Illness, unspecified: Secondary | ICD-10-CM | POA: Diagnosis not present

## 2022-05-17 LAB — POCT GLYCOSYLATED HEMOGLOBIN (HGB A1C): Hemoglobin A1C: 6.7 % — AB (ref 4.0–5.6)

## 2022-05-17 MED ORDER — SITAGLIPTIN PHOSPHATE 100 MG PO TABS: 100.0000 mg | ORAL_TABLET | Freq: Every day | ORAL | 0 refills | Status: DC

## 2022-05-17 MED ORDER — EST ESTROGENS-METHYLTEST 1.25-2.5 MG PO TABS: 1.0000 | ORAL_TABLET | Freq: Every day | ORAL | 3 refills | Status: DC

## 2022-05-17 MED ORDER — ALPRAZOLAM 0.5 MG PO TABS: 0.5000 mg | ORAL_TABLET | Freq: Every evening | ORAL | 0 refills | Status: DC | PRN

## 2022-05-17 NOTE — Progress Notes (Signed)
Established Patient Office Visit  Subjective   Patient ID: Kayla Gallagher, female    DOB: 09-03-1951  Age: 71 y.o. MRN: [id number]  Chief Complaint  Patient presents with   Diabetes    Follow up. Patient will call to schedule Diabetes eye exam.     Discuss Medication    Patient stated she has been off of the Estratest for 90 days due to insurance no longer covering medication. Patient stated she is starting to have hot flashes again and would like to discuss options.    HPI Pt is a 71 yo female with HTN, T2DM, HLD who presents to the clinic for medication refills and follow up.   Pt is not checking her sugars regularly. She is taking her medication. She is trying to eat less sugars and carbs and stay active. Denies any hypoglycemic events. No open wounds or sores. Pt is not having any CP, palpitations, headaches or vision changes.   She has been out of her estratest and would like to get back on it. Her hot flashes has been terrible. She stopped due to cost.   She does use xanax as needed and request refills for as needed use.   Patient Active Problem List   Diagnosis Date Noted   Uncontrolled type 2 diabetes mellitus with hyperglycemia (HCC) 10/26/2021   Grief reaction 11/10/2020   Contusion of rib on left side 08/09/2020   Panic attacks 08/09/2020   Metatarsal stress fracture of right foot 01/26/2020   Elevated serum creatinine 01/29/2018   Menopausal symptoms 01/29/2018   DDD (degenerative disc disease), cervical 06/22/2017   Acute abdominal pain in left lower quadrant 06/22/2017   Obesity 09/09/2015   Anxiety state 05/04/2015   Temporal arteritis (HCC) 06/18/2013   Actinic keratosis 04/16/2013   Type 2 diabetes mellitus (HCC) 02/21/2013   Breast mass, right 02/21/2013   Hyperplastic colon polyp 01/22/2013   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   History of hepatitis C 01/07/2013   Essential hypertension, benign 01/07/2013   Mixed hyperlipidemia 01/07/2013   History of breast  cancer 01/07/2013   Depression 01/07/2013   Past Medical History:  Diagnosis Date   Depression    Diabetes mellitus without complication (HCC)    Essential hypertension, benign 01/07/2013   Headache(784.0)    History of anemia    History of hepatitis C 01/07/2013   Hyperlipidemia    Hypertension    Does not see cardiology   Family History  Problem Relation Age of Onset   Heart attack Father    Diabetes Father        grandmother   Cancer Father    Heart disease Father    Hyperlipidemia Father    Hypertension Father    Cancer Mother    Alcoholism Other        parents   Prostate cancer Other        grandfather   Hyperlipidemia Other        grandfather   Breast cancer Sister    Allergies  Allergen Reactions   Morphine And Related Nausea And Vomiting   Farxiga [Dapagliflozin]     Dry mouth   Hydrochlorothiazide Other (See Comments)    Worsens bladder leakage      ROS See HPI.    Objective:     BP 122/67   Pulse 77   Resp 18   Ht 5\' 8"  (1.727 m)   Wt 190 lb (86.2 kg)   SpO2 97%   BMI 28.89 kg/m  BP Readings from Last 3 Encounters:  05/17/22 122/67  02/13/22 129/79  11/10/21 139/83   Wt Readings from Last 3 Encounters:  05/17/22 190 lb (86.2 kg)  02/13/22 191 lb (86.6 kg)  10/24/21 198 lb (89.8 kg)      Physical Exam Constitutional:      Appearance: Normal appearance. She is obese.  HENT:     Head: Normocephalic.  Cardiovascular:     Rate and Rhythm: Normal rate and regular rhythm.     Pulses: Normal pulses.  Pulmonary:     Effort: Pulmonary effort is normal.     Breath sounds: Normal breath sounds.  Abdominal:     Palpations: Abdomen is soft.  Musculoskeletal:     Right lower leg: No edema.     Left lower leg: No edema.  Neurological:     General: No focal deficit present.     Mental Status: She is alert and oriented to person, place, and time.  Psychiatric:        Mood and Affect: Mood normal.   .. Lab Results  Component Value Date    HGBA1C 6.7 (A) 05/17/2022       The 10-year ASCVD risk score (Arnett DK, et al., 2019) is: 23.8%    Assessment & Plan:  [name] KitchenJaviona was seen today for diabetes and discuss medication.  Diagnoses and all orders for this visit:  Controlled type 2 diabetes mellitus with complication, without long-term current use of insulin (HCC) -     POCT glycosylated hemoglobin (Hb A1C)  Menopausal symptoms -     estrogens-methylTEST (ESTRATEST) 1.25-2.5 MG tablet; Take 1 tablet by mouth daily.  Uncontrolled type 2 diabetes mellitus with hyperglycemia (HCC) -     sitaGLIPtin (JANUVIA) 100 MG tablet; Take 1 tablet (100 mg total) by mouth daily.  Panic attacks -     ALPRAZolam (XANAX) 0.5 MG tablet; Take 1 tablet (0.5 mg total) by mouth at bedtime as needed for anxiety.   A1C looks great at 6.7 Stay on same medications Refills sent today BP to goal. On ACE On statin. Needs eye exam.  UTD food exam Declines covid vaccine Declined pneumonia/shingles/flu shot.  Follow up in 3 months.   Xanax refilled for only as needed use.  Estratest restarted. Mammogram UTD. Discussed good rx as a more affordable way to get medication.    Return in about 3 months (around 08/17/2022).    Tandy Gaw, PA-C

## 2022-05-19 ENCOUNTER — Encounter: Payer: Self-pay | Admitting: Physician Assistant

## 2022-06-19 DIAGNOSIS — Z1329 Encounter for screening for other suspected endocrine disorder: Secondary | ICD-10-CM | POA: Diagnosis not present

## 2022-06-19 DIAGNOSIS — E782 Mixed hyperlipidemia: Secondary | ICD-10-CM | POA: Diagnosis not present

## 2022-06-19 DIAGNOSIS — I10 Essential (primary) hypertension: Secondary | ICD-10-CM | POA: Diagnosis not present

## 2022-06-19 DIAGNOSIS — E1165 Type 2 diabetes mellitus with hyperglycemia: Secondary | ICD-10-CM | POA: Diagnosis not present

## 2022-06-19 DIAGNOSIS — Z79899 Other long term (current) drug therapy: Secondary | ICD-10-CM | POA: Diagnosis not present

## 2022-06-20 ENCOUNTER — Encounter: Payer: Self-pay | Admitting: Physician Assistant

## 2022-06-20 DIAGNOSIS — R7989 Other specified abnormal findings of blood chemistry: Secondary | ICD-10-CM | POA: Insufficient documentation

## 2022-06-20 NOTE — Progress Notes (Signed)
Kayla Gallagher,   Hemoglobin looks great.  Kidney and liver looks great.  Yours TSH is elevated this means your active thyroid hormone is on the low side. Starting a low dose thyroid supplement could help. Are you ok with starting?  Add T3 and T4.   Your glucose is high. We need to discuss at next visit keeping this lower. I fear your A1C will be up from last check.

## 2022-06-23 LAB — CBC WITH DIFFERENTIAL/PLATELET
Absolute Monocytes: 583 cells/uL (ref 200–950)
Basophils Absolute: 41 cells/uL (ref 0–200)
Basophils Relative: 0.5 %
Eosinophils Absolute: 113 cells/uL (ref 15–500)
Eosinophils Relative: 1.4 %
HCT: 40.2 % (ref 35.0–45.0)
Hemoglobin: 13.7 g/dL (ref 11.7–15.5)
Lymphs Abs: 1944 cells/uL (ref 850–3900)
MCH: 30.7 pg (ref 27.0–33.0)
MCHC: 34.1 g/dL (ref 32.0–36.0)
MCV: 90.1 fL (ref 80.0–100.0)
MPV: 11 fL (ref 7.5–12.5)
Monocytes Relative: 7.2 %
Neutro Abs: 5419 cells/uL (ref 1500–7800)
Neutrophils Relative %: 66.9 %
Platelets: 315 10*3/uL (ref 140–400)
RBC: 4.46 10*6/uL (ref 3.80–5.10)
RDW: 12.7 % (ref 11.0–15.0)
Total Lymphocyte: 24 %
WBC: 8.1 10*3/uL (ref 3.8–10.8)

## 2022-06-23 LAB — COMPLETE METABOLIC PANEL WITH GFR
AG Ratio: 1.3 (calc) (ref 1.0–2.5)
ALT: 13 U/L (ref 6–29)
AST: 12 U/L (ref 10–35)
Albumin: 4.3 g/dL (ref 3.6–5.1)
Alkaline phosphatase (APISO): 79 U/L (ref 37–153)
BUN: 20 mg/dL (ref 7–25)
CO2: 27 mmol/L (ref 20–32)
Calcium: 10.4 mg/dL (ref 8.6–10.4)
Chloride: 101 mmol/L (ref 98–110)
Creat: 0.94 mg/dL (ref 0.60–1.00)
Globulin: 3.2 g/dL (calc) (ref 1.9–3.7)
Glucose, Bld: 232 mg/dL — ABNORMAL HIGH (ref 65–99)
Potassium: 5.3 mmol/L (ref 3.5–5.3)
Sodium: 135 mmol/L (ref 135–146)
Total Bilirubin: 0.3 mg/dL (ref 0.2–1.2)
Total Protein: 7.5 g/dL (ref 6.1–8.1)
eGFR: 65 mL/min/{1.73_m2} (ref >=60)

## 2022-06-23 LAB — LIPID PANEL W/REFLEX DIRECT LDL
Cholesterol: 123 mg/dL (ref <200)
HDL: 29 mg/dL — ABNORMAL LOW (ref >=50)
LDL Cholesterol (Calc): 68 mg/dL (calc)
Non-HDL Cholesterol (Calc): 94 mg/dL (calc) (ref <130)
Total CHOL/HDL Ratio: 4.2 (calc) (ref <5.0)
Triglycerides: 181 mg/dL — ABNORMAL HIGH (ref <150)

## 2022-06-23 LAB — TEST AUTHORIZATION

## 2022-06-23 LAB — T3, FREE: T3, Free: 3.1 pg/mL (ref 2.3–4.2)

## 2022-06-23 LAB — TSH: TSH: 4.58 mIU/L — ABNORMAL HIGH (ref 0.40–4.50)

## 2022-07-05 ENCOUNTER — Encounter: Payer: Self-pay | Admitting: General Practice

## 2022-07-10 ENCOUNTER — Ambulatory Visit (INDEPENDENT_AMBULATORY_CARE_PROVIDER_SITE_OTHER): Payer: Medicare HMO | Admitting: Physician Assistant

## 2022-07-10 DIAGNOSIS — Z0000 Encounter for general adult medical examination without abnormal findings: Secondary | ICD-10-CM | POA: Diagnosis not present

## 2022-07-10 NOTE — Progress Notes (Signed)
MEDICARE ANNUAL WELLNESS VISIT  07/10/2022  Telephone Visit Disclaimer This Medicare AWV was conducted by telephone due to national recommendations for restrictions regarding the COVID-19 Pandemic (e.g. social distancing).  I verified, using two identifiers, that I am speaking with [name] or their authorized healthcare agent. I discussed the limitations, risks, security, and privacy concerns of performing an evaluation and management service by telephone and the potential availability of an in-person appointment in the future. The patient expressed understanding and agreed to proceed.  Location of Patient: Home Location of Provider (nurse):  In the office.  Subjective:    Kayla Gallagher is a 71 y.o. female patient of Breeback, Lonna Cobb, PA-C who had a [employer] Visit today via telephone. Kayla Gallagher is Retired and lives alone. she has 1 child. she reports that she is socially active and does interact with friends/family regularly. she is minimally physically active and enjoys watching sports.  Patient Care Team: Nolene Ebbs as PCP - General (Family Medicine)     07/10/2022    1:17 PM 07/02/2017    9:35 AM  Advanced Directives  Does Patient Have a Medical Advance Directive? Yes Yes  Type of Advance Directive Living will Healthcare Power of [place];Living will  Does patient want to make changes to medical advance directive? No - Patient declined   Copy of Healthcare Power of Attorney in Chart?  No - copy requested    Hospital Utilization Over the Past 12 Months: # of hospitalizations or ER visits: 0 # of surgeries: 0  Review of Systems    Patient reports that her overall health is better compared to last year.  History obtained from chart review and the patient  Patient Reported Readings (BP, Pulse, CBG, Weight, etc) none  Pain Assessment Pain : No/denies pain     Current Medications & Allergies (verified) Allergies as of 07/10/2022        Reactions   Morphine And Related Nausea And Vomiting   Farxiga [dapagliflozin]    Dry mouth   Hydrochlorothiazide Other (See Comments)   Worsens bladder leakage        Medication List        Accurate as of July 10, 2022  1:34 PM. If you have any questions, ask your nurse or doctor.          ALPRAZolam 0.5 MG tablet Commonly known as: XANAX Take 1 tablet (0.5 mg total) by mouth at bedtime as needed for anxiety.   amLODipine 10 MG tablet Commonly known as: NORVASC TAKE 1 TABLET BY MOUTH EVERY DAY   atorvastatin 10 MG tablet Commonly known as: LIPITOR Take 1 tablet (10 mg total) by mouth daily.   Biotin 10 MG Tabs Take by mouth.   estrogens-methylTEST 1.25-2.5 MG tablet Commonly known as: ESTRATEST Take 1 tablet by mouth daily.   FreeStyle Libre 14 Day Sensor Misc 1 application by Does not apply route every 14 (fourteen) days. Apply upper deltoid every 14 days, use reader to determine blood sugars   lisinopril 40 MG tablet Commonly known as: ZESTRIL Take 1 tablet (40 mg total) by mouth daily. Take 1 tablet (40 mg total) by mouth daily.   metFORMIN 1000 MG tablet Commonly known as: GLUCOPHAGE TAKE 1 TABLET BY MOUTH 2 (TWO) TIMES DAILY WITH A MEAL.   metoprolol succinate 25 MG 24 hr tablet Commonly known as: TOPROL-XL TAKE 1 TABLET (25 MG TOTAL) BY MOUTH DAILY.   MULTIVITAMIN PO Take 1 tablet by  mouth daily.   PARoxetine 40 MG tablet Commonly known as: PAXIL Take 1 tablet (40 mg total) by mouth every morning.   sitaGLIPtin 100 MG tablet Commonly known as: JANUVIA Take 1 tablet (100 mg total) by mouth daily.        History (reviewed): Past Medical History:  Diagnosis Date   Depression    Diabetes mellitus without complication ([place])    Essential hypertension, benign 01/07/2013   Headache(784.0)    History of anemia    History of hepatitis C 01/07/2013   Hyperlipidemia    Hypertension    Does not see cardiology   Past Surgical History:   Procedure Laterality Date   ABDOMINAL HYSTERECTOMY  1976   ABDOMINAL HYSTERECTOMY     ARTERY BIOPSY Right 06/30/2013   Procedure: BIOPSY TEMPORAL ARTERY;  Surgeon: Rosetta Posner, MD;  Location: [facility] OR;  Service: Vascular;  Laterality: Right;   BREAST BIOPSY Right    BREAST REDUCTION SURGERY     BUNIONECTOMY     REDUCTION MAMMAPLASTY     tummy tuck     Family History  Problem Relation Age of Onset   Heart attack Father    Diabetes Father        grandmother   Cancer Father    Heart disease Father    Hyperlipidemia Father    Hypertension Father    Cancer Mother    Alcoholism Other        parents   Prostate cancer Other        grandfather   Hyperlipidemia Other        grandfather   Breast cancer Sister    Social History   Socioeconomic History   Marital status: Divorced    Spouse name: Not on file   Number of children: 1   Years of education: 13   Highest education level: Some college, no degree  Occupational History   Occupation: Retired  Tobacco Use   Smoking status: Never   Smokeless tobacco: Never  Vaping Use   Vaping Use: Never used  Substance and Sexual Activity   Alcohol use: No   Drug use: No   Sexual activity: Not Currently  Other Topics Concern   Not on file  Social History Narrative   Lives alone. Her daughter lives close by. Likes to enjoy watching sports.   Social Determinants of Health   Financial Resource Strain: Low Risk  (07/10/2022)   Overall Financial Resource Strain (CARDIA)    Difficulty of Paying Living Expenses: Not very hard  Food Insecurity: No Food Insecurity (07/10/2022)   Hunger Vital Sign    Worried About Running Out of Food in the Last Year: Never true    Ran Out of Food in the Last Year: Never true  Transportation Needs: No Transportation Needs (07/10/2022)   PRAPARE - Hydrologist (Medical): No    Lack of Transportation (Non-Medical): No  Physical Activity: Insufficiently Active (07/10/2022)    Exercise Vital Sign    Days of Exercise per Week: 1 day    Minutes of Exercise per Session: 50 min  Stress: No Stress Concern Present (07/10/2022)   [place]    Feeling of Stress : Not at all  Social Connections: Socially Isolated (07/10/2022)   Social Connection and Isolation Panel [NHANES]    Frequency of Communication with Friends and Family: More than three times a week    Frequency of Social Gatherings with  Friends and Family: More than three times a week    Attends Religious Services: Never    Active Member of Clubs or Organizations: No    Attends Banker Meetings: Never    Marital Status: Divorced    Activities of Daily Living    07/10/2022    1:19 PM  In your present state of health, do you have any difficulty performing the following activities:  Hearing? 0  Vision? 0  Difficulty concentrating or making decisions? 0  Walking or climbing stairs? 0  Dressing or bathing? 0  Doing errands, shopping? 0  Preparing Food and eating ? N  Using the Toilet? N  In the past six months, have you accidently leaked urine? Y  Comment Stress incontinence.  Do you have problems with loss of bowel control? N  Managing your Medications? N  Managing your Finances? N  Housekeeping or managing your Housekeeping? N    Patient Education/ Literacy How often do you need to have someone help you when you read instructions, pamphlets, or other written materials from your doctor or pharmacy?: 1 - Never What is the last grade level you completed in school?: some college  Exercise Current Exercise Habits: Home exercise routine, Type of exercise: walking, Time (Minutes): 45, Frequency (Times/Week): 1, Weekly Exercise (Minutes/Week): 45, Intensity: Moderate, Exercise limited by: None identified  Diet Patient reports consuming 3 meals a day and 2 snack(s) a day Patient reports that her primary diet is: Regular Patient  reports that she does have regular access to food.   Depression Screen    07/10/2022    1:17 PM 05/17/2022    2:34 PM 02/13/2022    1:44 PM 10/24/2021    1:57 PM 01/20/2020   11:39 AM 10/20/2019   10:23 AM 02/28/2019    9:49 AM  PHQ 2/9 Scores  PHQ - 2 Score 0 0 1 0 0 1 0  PHQ- 9 Score  1 2 0 1 1 0     Fall Risk    07/10/2022    1:17 PM 05/17/2022    2:34 PM 05/17/2022    2:09 PM 02/13/2022    1:44 PM 02/13/2022    1:26 PM  Fall Risk   Falls in the past year? 0 1 0 0 0  Number falls in past yr: 0 0 0 0 0  Injury with Fall? 0 0 0 0 0  Risk for fall due to : No Fall Risks No Fall Risks No Fall Risks No Fall Risks No Fall Risks  Follow up Falls evaluation completed Falls evaluation completed Falls prevention discussed;Falls evaluation completed Falls evaluation completed Falls prevention discussed;Falls evaluation completed     Objective:  Adelie A Doss seemed alert and oriented and she participated appropriately during our telephone visit.  Blood Pressure Weight BMI  BP Readings from Last 3 Encounters:  05/17/22 122/67  02/13/22 129/79  11/10/21 139/83   Wt Readings from Last 3 Encounters:  05/17/22 190 lb (86.2 kg)  02/13/22 191 lb (86.6 kg)  10/24/21 198 lb (89.8 kg)   BMI Readings from Last 1 Encounters:  05/17/22 28.89 kg/m    *Unable to obtain current vital signs, weight, and BMI due to telephone visit type  Hearing/Vision  Aliz did not seem to have difficulty with hearing/understanding during the telephone conversation Reports that she has had a formal eye exam by an eye care professional within the past year Reports that she has not had a formal hearing evaluation within the  past year *Unable to fully assess hearing and vision during telephone visit type  Cognitive Function:    07/10/2022    1:25 PM  6CIT Screen  What Year? 0 points  What month? 0 points  What time? 0 points  Count back from 20 0 points  Months in reverse 0 points  Repeat phrase 2 points   Total Score 2 points   (Normal:0-7, Significant for Dysfunction: >8)  Normal Cognitive Function Screening: Yes   Immunization & Health Maintenance Record Immunization History  Administered Date(s) Administered   Influenza,inj,Quad PF,6+ Mos 07/18/2013   Tdap 06/19/2010, 04/27/2017    Health Maintenance  Topic Date Due   OPHTHALMOLOGY EXAM  07/10/2022 (Originally 02/03/2021)   Diabetic kidney evaluation - Urine ACR  07/11/2022 (Originally 06/23/2016)   COVID-19 Vaccine (1) 07/26/2022 (Originally 10/21/1956)   Zoster Vaccines- Shingrix (1 of 2) 08/17/2022 (Originally [birth year]20/1969)   Pneumonia Vaccine 35+ Years old (1 - PCV) 10/24/2022 (Originally 10/21/2015)   INFLUENZA VACCINE  02/11/2023 (Originally 06/13/2022)   HEMOGLOBIN A1C  11/17/2022   FOOT EXAM  02/14/2023   Diabetic kidney evaluation - GFR measurement  06/20/2023   MAMMOGRAM  10/27/2023   COLONOSCOPY (Pts 45-45yrs Insurance coverage will need to be confirmed)  12/18/2026   TETANUS/TDAP  04/28/2027   DEXA SCAN  Completed   Hepatitis C Screening  Completed   HPV VACCINES  Aged Out       Assessment  This is a routine wellness examination for Kayla Gallagher.  Health Maintenance: Due or Overdue There are no preventive care reminders to display for this patient.   Mora Bellman Patmon does not need a referral for Community Assistance: Care Management:   no Social Work:    no Prescription Assistance:  no Nutrition/Diabetes Education:  no   Plan:  Personalized Goals  Goals Addressed               This Visit's Progress     Patient Stated (pt-stated)        07/10/2022 AWV Goal: Exercise for General Health  Patient will verbalize understanding of the benefits of increased physical activity: Exercising regularly is important. It will improve your overall fitness, flexibility, and endurance. Regular exercise also will improve your overall health. It can help you control your weight, reduce stress, and improve your bone  density. Over the next year, patient will increase physical activity as tolerated with a goal of at least 150 minutes of moderate physical activity per week.  You can tell that you are exercising at a moderate intensity if your heart starts beating faster and you start breathing faster but can still hold a conversation. Moderate-intensity exercise ideas include: Walking 1 mile (1.6 km) in about 15 minutes Biking Hiking Golfing Dancing Water aerobics Patient will verbalize understanding of everyday activities that increase physical activity by providing examples like the following: Yard work, such as: Sales promotion account executive Gardening Washing windows or floors Patient will be able to explain general safety guidelines for exercising:  Before you start a new exercise program, talk with your health care provider. Do not exercise so much that you hurt yourself, feel dizzy, or get very short of breath. Wear comfortable clothes and wear shoes with good support. Drink plenty of water while you exercise to prevent dehydration or heat stroke. Work out until your breathing and your heartbeat get faster.        Personalized Health Maintenance &  Screening Recommendations  Pneumococcal vaccine  Influenza vaccine Td vaccine Eye exam - scheduled Shingrix vaccine Urine ACR  Patient declined the vaccines at this time.  She is scheduled for an eye exam.  Lung Cancer Screening Recommended: no (Low Dose CT Chest recommended if Age 43-80 years, 30 pack-year currently smoking OR have quit w/in past 15 years) Hepatitis C Screening recommended: no HIV Screening recommended: no  Advanced Directives: Written information was not prepared per patient's request.  Referrals & Orders No orders of the defined types were placed in this encounter.   Follow-up Plan Follow-up with Donella Stade, PA-C as planned Please have  your records from your eye exam faxed to [place] after you have your appointment. Medicare wellness visit in one year. Patient will access AVS on my chart.   I have personally reviewed and noted the following in the patient's chart:   Medical and social history Use of alcohol, tobacco or illicit drugs  Current medications and supplements Functional ability and status Nutritional status Physical activity Advanced directives List of other physicians Hospitalizations, surgeries, and ER visits in previous 12 months Vitals Screenings to include cognitive, depression, and falls Referrals and appointments  In addition, I have reviewed and discussed with Neoma Laming A Ravelo certain preventive protocols, quality metrics, and best practice recommendations. A written personalized care plan for preventive services as well as general preventive health recommendations is available and can be mailed to the patient at her request.      Tinnie Gens, RN BSN  07/10/2022

## 2022-07-10 NOTE — Patient Instructions (Addendum)
MEDICARE ANNUAL WELLNESS VISIT Health Maintenance Summary and Written Plan of Care  Ms. Proto ,  Thank you for allowing me to perform your Medicare Annual Wellness Visit and for your ongoing commitment to your health.   Health Maintenance & Immunization History Health Maintenance  Topic Date Due  . OPHTHALMOLOGY EXAM  07/10/2022 (Originally 02/03/2021)  . Diabetic kidney evaluation - Urine ACR  07/11/2022 (Originally 06/23/2016)  . COVID-19 Vaccine (1) 07/26/2022 (Originally 10/21/1956)  . Zoster Vaccines- Shingrix (1 of 2) 08/17/2022 (Originally 10/20/1969)  . Pneumonia Vaccine 51+ Years old (1 - PCV) 10/24/2022 (Originally 10/21/2015)  . INFLUENZA VACCINE  02/11/2023 (Originally 06/13/2022)  . HEMOGLOBIN A1C  11/17/2022  . FOOT EXAM  02/14/2023  . Diabetic kidney evaluation - GFR measurement  06/20/2023  . MAMMOGRAM  10/27/2023  . COLONOSCOPY (Pts 45-33yrs Insurance coverage will need to be confirmed)  12/18/2026  . TETANUS/TDAP  04/28/2027  . DEXA SCAN  Completed  . Hepatitis C Screening  Completed  . HPV VACCINES  Aged Out   Immunization History  Administered Date(s) Administered  . Influenza,inj,Quad PF,6+ Mos 07/18/2013  . Tdap 06/19/2010, 04/27/2017    These are the patient goals that we discussed:  Goals Addressed              This Visit's Progress   .  Patient Stated (pt-stated)        07/10/2022 AWV Goal: Exercise for General Health  Patient will verbalize understanding of the benefits of increased physical activity: Exercising regularly is important. It will improve your overall fitness, flexibility, and endurance. Regular exercise also will improve your overall health. It can help you control your weight, reduce stress, and improve your bone density. Over the next year, patient will increase physical activity as tolerated with a goal of at least 150 minutes of moderate physical activity per week.  You can tell that you are exercising at a moderate intensity if your  heart starts beating faster and you start breathing faster but can still hold a conversation. Moderate-intensity exercise ideas include: Walking 1 mile (1.6 km) in about 15 minutes Biking Hiking Golfing Dancing Water aerobics Patient will verbalize understanding of everyday activities that increase physical activity by providing examples like the following: Yard work, such as: Insurance underwriter Gardening Washing windows or floors Patient will be able to explain general safety guidelines for exercising:  Before you start a new exercise program, talk with your health care provider. Do not exercise so much that you hurt yourself, feel dizzy, or get very short of breath. Wear comfortable clothes and wear shoes with good support. Drink plenty of water while you exercise to prevent dehydration or heat stroke. Work out until your breathing and your heartbeat get faster.         This is a list of Health Maintenance Items that are overdue or due now: Pneumococcal vaccine  Influenza vaccine Td vaccine Eye exam- scheduled Shingrix vaccine Urine ACR  Patient declined the vaccines at this time.  She is scheduled for an eye exam.  Orders/Referrals Placed Today: No orders of the defined types were placed in this encounter.  (Contact our referral department at [phone] if you have not spoken with someone about your referral appointment within the next 5 days)    Follow-up Plan Follow-up with Jomarie Longs, PA-C as planned Please have your records from your eye exam faxed to [place] after you have  your appointment. Medicare wellness visit in one year. Patient will access AVS on my chart.      Health Maintenance, Female Adopting a healthy lifestyle and getting preventive care are important in promoting health and wellness. Ask your health care provider about: The right schedule for you to have  regular tests and exams. Things you can do on your own to prevent diseases and keep yourself healthy. What should I know about diet, weight, and exercise? Eat a healthy diet  Eat a diet that includes plenty of vegetables, fruits, low-fat dairy products, and lean protein. Do not eat a lot of foods that are high in solid fats, added sugars, or sodium. Maintain a healthy weight Body mass index (BMI) is used to identify weight problems. It estimates body fat based on height and weight. Your health care provider can help determine your BMI and help you achieve or maintain a healthy weight. Get regular exercise Get regular exercise. This is one of the most important things you can do for your health. Most adults should: Exercise for at least 150 minutes each week. The exercise should increase your heart rate and make you sweat (moderate-intensity exercise). Do strengthening exercises at least twice a week. This is in addition to the moderate-intensity exercise. Spend less time sitting. Even light physical activity can be beneficial. Watch cholesterol and blood lipids Have your blood tested for lipids and cholesterol at 72 years of age, then have this test every 5 years. Have your cholesterol levels checked more often if: Your lipid or cholesterol levels are high. You are older than 72 years of age. You are at high risk for heart disease. What should I know about cancer screening? Depending on your health history and family history, you may need to have cancer screening at various ages. This may include screening for: Breast cancer. Cervical cancer. Colorectal cancer. Skin cancer. Lung cancer. What should I know about heart disease, diabetes, and high blood pressure? Blood pressure and heart disease High blood pressure causes heart disease and increases the risk of stroke. This is more likely to develop in people who have high blood pressure readings or are overweight. Have your blood pressure  checked: Every 3-5 years if you are 15-107 years of age. Every year if you are 61 years old or older. Diabetes Have regular diabetes screenings. This checks your fasting blood sugar level. Have the screening done: Once every three years after age 47 if you are at a normal weight and have a low risk for diabetes. More often and at a younger age if you are overweight or have a high risk for diabetes. What should I know about preventing infection? Hepatitis B If you have a higher risk for hepatitis B, you should be screened for this virus. Talk with your health care provider to find out if you are at risk for hepatitis B infection. Hepatitis C Testing is recommended for: Everyone born from [birth year] through 1966. Anyone with known risk factors for hepatitis C. Sexually transmitted infections (STIs) Get screened for STIs, including gonorrhea and chlamydia, if: You are sexually active and are younger than 72 years of age. You are older than 72 years of age and your health care provider tells you that you are at risk for this type of infection. Your sexual activity has changed since you were last screened, and you are at increased risk for chlamydia or gonorrhea. Ask your health care provider if you are at risk. Ask your health care provider about  whether you are at high risk for HIV. Your health care provider may recommend a prescription medicine to help prevent HIV infection. If you choose to take medicine to prevent HIV, you should first get tested for HIV. You should then be tested every 3 months for as long as you are taking the medicine. Pregnancy If you are about to stop having your period (premenopausal) and you may become pregnant, seek counseling before you get pregnant. Take 400 to 800 micrograms (mcg) of folic acid every day if you become pregnant. Ask for birth control (contraception) if you want to prevent pregnancy. Osteoporosis and menopause Osteoporosis is a disease in which the bones  lose minerals and strength with aging. This can result in bone fractures. If you are 38 years old or older, or if you are at risk for osteoporosis and fractures, ask your health care provider if you should: Be screened for bone loss. Take a calcium or vitamin D supplement to lower your risk of fractures. Be given hormone replacement therapy (HRT) to treat symptoms of menopause. Follow these instructions at home: Alcohol use Do not drink alcohol if: Your health care provider tells you not to drink. You are pregnant, may be pregnant, or are planning to become pregnant. If you drink alcohol: Limit how much you have to: 0-1 drink a day. Know how much alcohol is in your drink. In the [place] one drink equals one 12 oz bottle of beer (355 mL), one 5 oz glass of wine (148 mL), or one 1 oz glass of hard liquor (44 mL). Lifestyle Do not use any products that contain nicotine or tobacco. These products include cigarettes, chewing tobacco, and vaping devices, such as e-cigarettes. If you need help quitting, ask your health care provider. Do not use street drugs. Do not share needles. Ask your health care provider for help if you need support or information about quitting drugs. General instructions Schedule regular health, dental, and eye exams. Stay current with your vaccines. Tell your health care provider if: You often feel depressed. You have ever been abused or do not feel safe at home. Summary Adopting a healthy lifestyle and getting preventive care are important in promoting health and wellness. Follow your health care provider's instructions about healthy diet, exercising, and getting tested or screened for diseases. Follow your health care provider's instructions on monitoring your cholesterol and blood pressure. This information is not intended to replace advice given to you by your health care provider. Make sure you discuss any questions you have with your health care provider. Document  Revised: 03/21/2021 Document Reviewed: 03/21/2021 Elsevier Patient Education  2023 [employer].

## 2022-07-14 ENCOUNTER — Ambulatory Visit: Payer: Medicare HMO

## 2022-07-20 ENCOUNTER — Other Ambulatory Visit: Payer: Self-pay | Admitting: Physician Assistant

## 2022-07-20 DIAGNOSIS — I10 Essential (primary) hypertension: Secondary | ICD-10-CM

## 2022-07-20 LAB — HM DIABETES EYE EXAM

## 2022-08-12 ENCOUNTER — Other Ambulatory Visit: Payer: Self-pay | Admitting: Physician Assistant

## 2022-08-12 DIAGNOSIS — E1165 Type 2 diabetes mellitus with hyperglycemia: Secondary | ICD-10-CM

## 2022-08-17 ENCOUNTER — Ambulatory Visit (INDEPENDENT_AMBULATORY_CARE_PROVIDER_SITE_OTHER): Payer: Medicare HMO | Admitting: Physician Assistant

## 2022-08-17 ENCOUNTER — Encounter: Payer: Self-pay | Admitting: Physician Assistant

## 2022-08-17 VITALS — BP 138/78 | HR 78

## 2022-08-17 DIAGNOSIS — E782 Mixed hyperlipidemia: Secondary | ICD-10-CM

## 2022-08-17 DIAGNOSIS — E1165 Type 2 diabetes mellitus with hyperglycemia: Secondary | ICD-10-CM | POA: Diagnosis not present

## 2022-08-17 DIAGNOSIS — I10 Essential (primary) hypertension: Secondary | ICD-10-CM

## 2022-08-17 DIAGNOSIS — R809 Proteinuria, unspecified: Secondary | ICD-10-CM | POA: Diagnosis not present

## 2022-08-17 DIAGNOSIS — E118 Type 2 diabetes mellitus with unspecified complications: Secondary | ICD-10-CM

## 2022-08-17 LAB — POCT GLYCOSYLATED HEMOGLOBIN (HGB A1C): Hemoglobin A1C: 7.7 % — AB (ref 4.0–5.6)

## 2022-08-17 LAB — POCT UA - MICROALBUMIN
Creatinine, POC: 300 mg/dL
Microalbumin Ur, POC: 150 mg/L

## 2022-08-17 MED ORDER — AMLODIPINE BESYLATE 10 MG PO TABS: ORAL_TABLET | ORAL | 1 refills | Status: DC

## 2022-08-17 MED ORDER — METOPROLOL SUCCINATE ER 25 MG PO TB24: 25.0000 mg | ORAL_TABLET | Freq: Every day | ORAL | 3 refills | Status: DC

## 2022-08-17 MED ORDER — LISINOPRIL 40 MG PO TABS: 40.0000 mg | ORAL_TABLET | Freq: Every day | ORAL | 1 refills | Status: DC

## 2022-08-17 MED ORDER — SITAGLIPTIN PHOSPHATE 100 MG PO TABS: 100.0000 mg | ORAL_TABLET | Freq: Every day | ORAL | 3 refills | Status: DC

## 2022-08-17 NOTE — Progress Notes (Addendum)
Established Patient Office Visit  Subjective   Patient ID: Kayla Gallagher, female    DOB: 08/21/1951  Age: 71 y.o. MRN: [id number]  Chief Complaint  Patient presents with   Diabetes   Follow-up    HPI Pt is a 71 yo female with T2DM, HTN, HLD, MDD, GAD who presents to the clinic for follow up and medication refills.   Pt is not checking sugars because she doesn't always get her libre. She has been out of januiva for 3 weeks. No hypoglycemic events. Admits to eating more sugar filled foods like cookies. She is walking some days. No CP, palpitations, headaches, or vision changes.   She has also been out of metoprolol.   .. Active Ambulatory Problems    Diagnosis Date Noted   History of hepatitis C 01/07/2013   Essential hypertension, benign 01/07/2013   Mixed hyperlipidemia 01/07/2013   History of breast cancer 01/07/2013   Depression 01/07/2013   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   Hyperplastic colon polyp 01/22/2013   Type 2 diabetes mellitus ([place]) 02/21/2013   Breast mass, right 02/21/2013   Actinic keratosis 04/16/2013   Temporal arteritis ([place]) 06/18/2013   Anxiety state 05/04/2015   Obesity 09/09/2015   DDD (degenerative disc disease), cervical 06/22/2017   Acute abdominal pain in left lower quadrant 06/22/2017   Elevated serum creatinine 01/29/2018   Menopausal symptoms 01/29/2018   Metatarsal stress fracture of right foot 01/26/2020   Contusion of rib on left side 08/09/2020   Panic attacks 08/09/2020   Grief reaction 11/10/2020   Uncontrolled type 2 diabetes mellitus with hyperglycemia ([place]) 10/26/2021   Elevated TSH 06/20/2022   Microalbuminuria 08/17/2022   Resolved Ambulatory Problems    Diagnosis Date Noted   Strain of elbow, left 06/01/2014   Community acquired pneumonia of right middle lobe of lung 12/06/2017   Past Medical History:  Diagnosis Date   Diabetes mellitus without complication ([place])    NOBSJGGE(366.2)    History of anemia    Hyperlipidemia     Hypertension        ROS See HPI>    Objective:     BP 138/78   Pulse 78   SpO2 96%  BP Readings from Last 3 Encounters:  08/17/22 138/78  05/17/22 122/67  02/13/22 129/79   Wt Readings from Last 3 Encounters:  05/17/22 190 lb (86.2 kg)  02/13/22 191 lb (86.6 kg)  10/24/21 198 lb (89.8 kg)   .Marland Kitchen Results for orders placed or performed in visit on 08/17/22  POCT glycosylated hemoglobin (Hb A1C)  Result Value Ref Range   Hemoglobin A1C 7.7 (A) 4.0 - 5.6 %   HbA1c POC (<> result, manual entry)     HbA1c, POC (prediabetic range)     HbA1c, POC (controlled diabetic range)    POCT UA - Microalbumin  Result Value Ref Range   Microalbumin Ur, POC 150 mg/L   Creatinine, POC 300 mg/dL   Albumin/Creatinine Ratio, Urine, POC 30-300        Physical Exam Vitals reviewed.  Constitutional:      Appearance: Normal appearance. She is obese.  HENT:     Head: Normocephalic.  Cardiovascular:     Rate and Rhythm: Normal rate and regular rhythm.     Pulses: Normal pulses.     Heart sounds: Normal heart sounds.  Pulmonary:     Effort: Pulmonary effort is normal.     Breath sounds: Normal breath sounds.  Musculoskeletal:     Right lower  leg: No edema.     Left lower leg: No edema.  Neurological:     General: No focal deficit present.     Mental Status: She is alert and oriented to person, place, and time.  Psychiatric:        Mood and Affect: Mood normal.          Assessment & Plan:  [name] KitchenTyree was seen today for diabetes and follow-up.  Diagnoses and all orders for this visit:  Uncontrolled type 2 diabetes mellitus with hyperglycemia ([place]) -     POCT glycosylated hemoglobin (Hb A1C) -     POCT UA - Microalbumin -     sitaGLIPtin (JANUVIA) 100 MG tablet; Take 1 tablet (100 mg total) by mouth daily.  Essential hypertension, benign -     metoprolol succinate (TOPROL-XL) 25 MG 24 hr tablet; Take 1 tablet (25 mg total) by mouth daily. -     lisinopril (ZESTRIL) 40 MG  tablet; Take 1 tablet (40 mg total) by mouth daily. Take 1 tablet (40 mg total) by mouth daily. -     amLODipine (NORVASC) 10 MG tablet; TAKE 1 TABLET BY MOUTH EVERY DAY  Mixed hyperlipidemia  Microalbuminuria    A1C is up Discussed working on diet Refilled januvia, metformin BP not to goal. Discussed goal of under 130/80. Pt has been out of medication. Refilled medication. Keep BP logs at home.  Microalbumin abnormal-on ACE. Did not tolerate farxiga. CMP has been stable and improving.  On statin Needs eye exam. Declined flu/pneumonia/covid vaccine.  Follow up in 3 months  Iran Planas, PA-C

## 2022-08-17 NOTE — Patient Instructions (Signed)

## 2022-08-17 NOTE — Addendum Note (Signed)
Addended by: Donella Stade on: 08/17/2022 09:33 AM   Modules accepted: Level of Service

## 2022-08-18 ENCOUNTER — Ambulatory Visit: Payer: Medicare HMO | Admitting: Physician Assistant

## 2022-11-01 ENCOUNTER — Ambulatory Visit: Payer: Medicare HMO

## 2022-11-01 ENCOUNTER — Other Ambulatory Visit: Payer: Self-pay | Admitting: Physician Assistant

## 2022-11-01 DIAGNOSIS — Z1231 Encounter for screening mammogram for malignant neoplasm of breast: Secondary | ICD-10-CM

## 2022-11-02 ENCOUNTER — Other Ambulatory Visit: Payer: Self-pay | Admitting: Physician Assistant

## 2022-11-02 DIAGNOSIS — E1165 Type 2 diabetes mellitus with hyperglycemia: Secondary | ICD-10-CM

## 2022-11-15 ENCOUNTER — Ambulatory Visit: Payer: Medicare HMO

## 2022-11-15 ENCOUNTER — Ambulatory Visit: Payer: Medicare HMO | Admitting: Physician Assistant

## 2022-11-21 ENCOUNTER — Ambulatory Visit (INDEPENDENT_AMBULATORY_CARE_PROVIDER_SITE_OTHER): Payer: Medicare HMO | Admitting: Physician Assistant

## 2022-11-21 ENCOUNTER — Encounter: Payer: Self-pay | Admitting: Physician Assistant

## 2022-11-21 VITALS — BP 134/71 | HR 70 | Ht 68.0 in | Wt 199.0 lb

## 2022-11-21 DIAGNOSIS — E1165 Type 2 diabetes mellitus with hyperglycemia: Secondary | ICD-10-CM

## 2022-11-21 DIAGNOSIS — I10 Essential (primary) hypertension: Secondary | ICD-10-CM | POA: Diagnosis not present

## 2022-11-21 DIAGNOSIS — E6609 Other obesity due to excess calories: Secondary | ICD-10-CM

## 2022-11-21 DIAGNOSIS — R69 Illness, unspecified: Secondary | ICD-10-CM | POA: Diagnosis not present

## 2022-11-21 DIAGNOSIS — E782 Mixed hyperlipidemia: Secondary | ICD-10-CM | POA: Diagnosis not present

## 2022-11-21 DIAGNOSIS — R809 Proteinuria, unspecified: Secondary | ICD-10-CM | POA: Diagnosis not present

## 2022-11-21 DIAGNOSIS — Z6830 Body mass index (BMI) 30.0-30.9, adult: Secondary | ICD-10-CM | POA: Diagnosis not present

## 2022-11-21 DIAGNOSIS — F32A Depression, unspecified: Secondary | ICD-10-CM

## 2022-11-21 LAB — POCT GLYCOSYLATED HEMOGLOBIN (HGB A1C): Hemoglobin A1C: 8.7 % — AB (ref 4.0–5.6)

## 2022-11-21 MED ORDER — TIRZEPATIDE 5 MG/0.5ML ~~LOC~~ SOAJ: 5.0000 mg | SUBCUTANEOUS | 1 refills | Status: DC

## 2022-11-21 MED ORDER — MOUNJARO 2.5 MG/0.5ML ~~LOC~~ SOAJ: 2.5000 mg | SUBCUTANEOUS | 0 refills | Status: DC

## 2022-11-21 NOTE — Progress Notes (Signed)
Established Patient Office Visit  Subjective   Patient ID: Kayla Gallagher, female    DOB: 12/13/1950  Age: 72 y.o. MRN: [id number]  Chief Complaint  Patient presents with   Follow-up   Diabetes    HPI Pt is a 72 yo obese female with T2DM, HTN, HLD, and mircoalbumina who presents to the clinic for 3 month follow up.   She admits to running out of [place] and not having it for the last month. She has been taking metformin. Not checking sugars. No hypoglycemia. No open sores or wounds. No CP, palpitations, headaches or vision changes.   Overall she feels really good.     .. Active Ambulatory Problems    Diagnosis Date Noted   History of hepatitis C 01/07/2013   Essential hypertension, benign 01/07/2013   Mixed hyperlipidemia 01/07/2013   History of breast cancer 01/07/2013   Depression 01/07/2013   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   Hyperplastic colon polyp 01/22/2013   Type 2 diabetes mellitus ([facility]) 02/21/2013   Breast mass, right 02/21/2013   Actinic keratosis 04/16/2013   Temporal arteritis ([facility]) 06/18/2013   Anxiety state 05/04/2015   Obesity 09/09/2015   DDD (degenerative disc disease), cervical 06/22/2017   Acute abdominal pain in left lower quadrant 06/22/2017   Elevated serum creatinine 01/29/2018   Menopausal symptoms 01/29/2018   Metatarsal stress fracture of right foot 01/26/2020   Contusion of rib on left side 08/09/2020   Panic attacks 08/09/2020   Grief reaction 11/10/2020   Uncontrolled type 2 diabetes mellitus with hyperglycemia ([facility]) 10/26/2021   Elevated TSH 06/20/2022   Microalbuminuria 08/17/2022   Resolved Ambulatory Problems    Diagnosis Date Noted   Strain of elbow, left 06/01/2014   Community acquired pneumonia of right middle lobe of lung 12/06/2017   Past Medical History:  Diagnosis Date   Diabetes mellitus without complication ([facility])    Headache(784.0)    History of anemia    Hyperlipidemia    Hypertension     Review of Systems  All other  systems reviewed and are negative.     Objective:     BP 134/71   Pulse 70   Ht 5\' 8"  (1.727 m)   Wt 199 lb (90.3 kg)   SpO2 96%   BMI 30.26 kg/m  BP Readings from Last 3 Encounters:  11/21/22 134/71  08/17/22 138/78  05/17/22 122/67   Wt Readings from Last 3 Encounters:  11/21/22 199 lb (90.3 kg)  05/17/22 190 lb (86.2 kg)  02/13/22 191 lb (86.6 kg)      Physical Exam Vitals reviewed.  Constitutional:      Appearance: Normal appearance. She is obese.  HENT:     Head: Normocephalic.  Neck:     Vascular: No carotid bruit.  Cardiovascular:     Rate and Rhythm: Normal rate and regular rhythm.     Heart sounds: Murmur heard.  Pulmonary:     Effort: Pulmonary effort is normal.     Breath sounds: Normal breath sounds.  Neurological:     General: No focal deficit present.     Mental Status: She is alert and oriented to person, place, and time.  Psychiatric:        Mood and Affect: Mood normal.      Results for orders placed or performed in visit on 11/21/22  POCT glycosylated hemoglobin (Hb A1C)  Result Value Ref Range   Hemoglobin A1C 8.7 (A) 4.0 - 5.6 %   HbA1c POC (<>  result, manual entry)     HbA1c, POC (prediabetic range)     HbA1c, POC (controlled diabetic range)          Assessment & Plan:  [name] KitchenKahliya was seen today for follow-up and diabetes.  Diagnoses and all orders for this visit:  Uncontrolled type 2 diabetes mellitus with hyperglycemia ([facility]) -     COMPLETE METABOLIC PANEL WITH GFR -     POCT glycosylated hemoglobin (Hb A1C) -     tirzepatide (MOUNJARO) 2.5 MG/0.5ML Pen; Inject 2.5 mg into the skin once a week.  Essential hypertension, benign -     COMPLETE METABOLIC PANEL WITH GFR  Microalbuminuria  Mixed hyperlipidemia  Depression, unspecified depression type  Class 1 obesity due to excess calories with serious comorbidity and body mass index (BMI) of 30.0 to 30.9 in adult   A1C up and not to goal Continue metformin Add  mounjaro Discussed SE and how to use.  Increase to 5mg  after 1 month BP very close to goal, on ACE On lipitor, LDL to goal Eye and foot exam UTD Vaccine UTD Follow up in 3 months    Iran Planas, PA-C

## 2022-11-21 NOTE — Patient Instructions (Signed)
Tirzepatide Injection What is this medication? TIRZEPATIDE (tir ZEP a tide) treats type 2 diabetes. It works by increasing insulin levels in your body, which decreases your blood sugar (glucose). Changes to diet and exercise are often combined with this medication. This medicine may be used for other purposes; ask your health care provider or pharmacist if you have questions. COMMON BRAND NAME(S): MOUNJARO What should I tell my care team before I take this medication? They need to know if you have any of these conditions: Endocrine tumors (MEN 2) or if someone in your family had these tumors Eye disease, vision problems Gallbladder disease History of pancreatitis Kidney disease Stomach or intestine problems Thyroid cancer or if someone in your family had thyroid cancer An unusual or allergic reaction to tirzepatide, other medications, foods, dyes, or preservatives Pregnant or trying to get pregnant Breast-feeding How should I use this medication? This medication is injected under the skin. You will be taught how to prepare and give it. It is given once every week (every 7 days). Keep taking it unless your health care provider tells you to stop. If you use this medication with insulin, you should inject this medication and the insulin separately. Do not mix them together. Do not give the injections right next to each other. Change (rotate) injection sites with each injection. This medication comes with INSTRUCTIONS FOR USE. Ask your pharmacist for directions on how to use this medication. Read the information carefully. Talk to your pharmacist or care team if you have questions. It is important that you put your used needles and syringes in a special sharps container. Do not put them in a trash can. If you do not have a sharps container, call your pharmacist or care team to get one. A special MedGuide will be given to you by the pharmacist with each prescription and refill. Be sure to read this  information carefully each time. Talk to your care team about the use of this medication in children. Special care may be needed. Overdosage: If you think you have taken too much of this medicine contact a poison control center or emergency room at once. NOTE: This medicine is only for you. Do not share this medicine with others. What if I miss a dose? If you miss a dose, take it as soon as you can unless it is more than 4 days (96 hours) late. If it is more than 4 days late, skip the missed dose. Take the next dose at the normal time. Do not take 2 doses within 3 days of each other. What may interact with this medication? Alcohol Antiviral medications for HIV or AIDS Aspirin and aspirin-like medications Beta blockers, such as atenolol, metoprolol, propranolol Certain medications for blood pressure, heart disease, irregular heart beat Chromium Clonidine Diuretics Estrogen or progestin hormones Fenofibrate Gemfibrozil Guanethidine Isoniazid Lanreotide Female hormones or anabolic steroids MAOIs, such as Marplan, Nardil, and Parnate Medications for weight loss Medications for allergies, asthma, cold, or cough Medications for depression, anxiety, or mental health conditions Niacin Nicotine NSAIDs, medications for pain and inflammation, such as ibuprofen or naproxen Octreotide Other medications for diabetes, such as glyburide, glipizide, or glimepiride Pasireotide Pentamidine Phenytoin Probenecid Quinolone antibiotics, such as ciprofloxacin, levofloxacin, ofloxacin Reserpine Some herbal dietary supplements Steroid medications, such as prednisone or cortisone Sulfamethoxazole; trimethoprim Thyroid hormones Warfarin This list may not describe all possible interactions. Give your health care provider a list of all the medicines, herbs, non-prescription drugs, or dietary supplements you use. Also tell them   if you smoke, drink alcohol, or use illegal drugs. Some items may interact with  your medicine. What should I watch for while using this medication? Visit your care team for regular checks on your progress. Drink plenty of fluids while taking this medication. Check with your care team if you get an attack of severe diarrhea, nausea, and vomiting. The loss of too much body fluid can make it dangerous for you to take this medication. A test called the HbA1C (A1C) will be monitored. This is a simple blood test. It measures your blood sugar control over the last 2 to 3 months. You will receive this test every 3 to 6 months. Learn how to check your blood sugar. Learn the symptoms of low and high blood sugar and how to manage them. Always carry a quick-source of sugar with you in case you have symptoms of low blood sugar. Examples include hard sugar candy or glucose tablets. Make sure others know that you can choke if you eat or drink when you develop serious symptoms of low blood sugar, such as seizures or unconsciousness. They must get medical help at once. Tell your care team if you have high blood sugar. You might need to change the dose of your medication. If you are sick or exercising more than usual, you might need to change the dose of your medication. Do not skip meals. Ask your care team if you should avoid alcohol. Many nonprescription cough and cold products contain sugar or alcohol. These can affect blood sugar. Pens should never be shared. Even if the needle is changed, sharing may result in passing of viruses like hepatitis or HIV. Wear a medical ID bracelet or chain, and carry a card that describes your disease and details of your medication and dosage times. Birth control may not work properly while you are taking this medication. If you take birth control pills by mouth, your care team may recommend another type of birth control for 4 weeks after you start this medication and for 4 weeks after each increase in your dose of this medication. Ask your care team which birth  control methods you should use. What side effects may I notice from receiving this medication? Side effects that you should report to your care team as soon as possible: Allergic reactions or angioedema--skin rash, itching or hives, swelling of the face, eyes, lips, tongue, arms, or legs, trouble swallowing or breathing Change in vision Dehydration--increased thirst, dry mouth, feeling faint or lightheaded, headache, dark yellow or brown urine Gallbladder problems--severe stomach pain, nausea, vomiting, fever Kidney injury--decrease in the amount of urine, swelling of the ankles, hands, or feet Pancreatitis--severe stomach pain that spreads to your back or gets worse after eating or when touched, fever, nausea, vomiting Thyroid cancer--new mass or lump in the neck, pain or trouble swallowing, trouble breathing, hoarseness Side effects that usually do not require medical attention (report these to your care team if they continue or are bothersome): Constipation Diarrhea Loss of appetite Nausea Stomach pain Upset stomach Vomiting This list may not describe all possible side effects. Call your doctor for medical advice about side effects. You may report side effects to FDA at 1-800-FDA-1088. Where should I keep my medication? Keep out of the reach of children and pets. Refrigeration (preferred): Store in the refrigerator. Keep this medication in the original carton until you are ready to take it. Do not freeze. Protect from light. Get rid of opened vials after use, even if there is medication   left. Get rid of any unopened vials or pens after the expiration date. Room Temperature: This medication may be stored at room temperature below 30 degrees C (86 degrees F) for up to 21 days. Keep this medication in the original carton until you are ready to take it. Protect from light. Avoid exposure to extreme heat. Get rid of opened vials after use, even if there is medication left. Get rid of any unopened  vials or pens after 21 days, or after they expire, whichever is first. To get rid of medications that are no longer needed or have expired: Take the medication to a medication take-back program. Check with your pharmacy or law enforcement to find a location. If you cannot return the medication, ask your pharmacist or care team how to get rid of this medication safely. NOTE: This sheet is a summary. It may not cover all possible information. If you have questions about this medicine, talk to your doctor, pharmacist, or health care provider.  2023 Elsevier/Gold Standard (2021-03-30 00:00:00)  

## 2022-11-22 LAB — COMPLETE METABOLIC PANEL WITH GFR
AG Ratio: 1.3 (calc) (ref 1.0–2.5)
ALT: 18 U/L (ref 6–29)
AST: 15 U/L (ref 10–35)
Albumin: 4.5 g/dL (ref 3.6–5.1)
Alkaline phosphatase (APISO): 71 U/L (ref 37–153)
BUN: 16 mg/dL (ref 7–25)
CO2: 28 mmol/L (ref 20–32)
Calcium: 10.4 mg/dL (ref 8.6–10.4)
Chloride: 99 mmol/L (ref 98–110)
Creat: 0.85 mg/dL (ref 0.60–1.00)
Globulin: 3.4 g/dL (calc) (ref 1.9–3.7)
Glucose, Bld: 260 mg/dL — ABNORMAL HIGH (ref 65–99)
Potassium: 4.8 mmol/L (ref 3.5–5.3)
Sodium: 137 mmol/L (ref 135–146)
Total Bilirubin: 0.3 mg/dL (ref 0.2–1.2)
Total Protein: 7.9 g/dL (ref 6.1–8.1)
eGFR: 73 mL/min/{1.73_m2} (ref >=60)

## 2022-11-22 NOTE — Progress Notes (Signed)
Glucose elevated but we added medication at visit to help with this.  Kidney and liver look great.

## 2022-11-27 ENCOUNTER — Other Ambulatory Visit: Payer: Self-pay | Admitting: Physician Assistant

## 2022-11-27 DIAGNOSIS — N951 Menopausal and female climacteric states: Secondary | ICD-10-CM

## 2022-12-02 ENCOUNTER — Other Ambulatory Visit: Payer: Self-pay | Admitting: Physician Assistant

## 2022-12-02 DIAGNOSIS — N951 Menopausal and female climacteric states: Secondary | ICD-10-CM

## 2022-12-04 ENCOUNTER — Telehealth: Payer: Self-pay

## 2022-12-04 MED ORDER — SITAGLIPTIN PHOSPHATE 100 MG PO TABS: 100.0000 mg | ORAL_TABLET | Freq: Every day | ORAL | 0 refills | Status: DC

## 2022-12-04 NOTE — Telephone Encounter (Signed)
Depressed mood with mounjaro. Would like to switch back to [place]. Sent Celesta Gentile to pharmacy.

## 2022-12-04 NOTE — Telephone Encounter (Signed)
Sent januvia 100mg  daily to replace mounjaro. Keep regularly scheduled diabetic follow up appt.

## 2022-12-07 ENCOUNTER — Other Ambulatory Visit: Payer: Self-pay | Admitting: Physician Assistant

## 2022-12-07 DIAGNOSIS — N951 Menopausal and female climacteric states: Secondary | ICD-10-CM

## 2022-12-07 NOTE — Telephone Encounter (Signed)
Patient at the pharmacy now and they state they did not receive RX sent on 11/28/2022 Kristopher Oppenheim). Can you sign RX?

## 2022-12-17 ENCOUNTER — Other Ambulatory Visit: Payer: Self-pay | Admitting: Physician Assistant

## 2022-12-17 DIAGNOSIS — E1165 Type 2 diabetes mellitus with hyperglycemia: Secondary | ICD-10-CM

## 2023-01-02 ENCOUNTER — Other Ambulatory Visit: Payer: Self-pay | Admitting: Physician Assistant

## 2023-01-02 DIAGNOSIS — I10 Essential (primary) hypertension: Secondary | ICD-10-CM

## 2023-02-19 ENCOUNTER — Other Ambulatory Visit: Payer: Self-pay | Admitting: Sports Medicine

## 2023-02-19 DIAGNOSIS — E1165 Type 2 diabetes mellitus with hyperglycemia: Secondary | ICD-10-CM

## 2023-02-26 ENCOUNTER — Encounter: Payer: Self-pay | Admitting: Physician Assistant

## 2023-02-26 ENCOUNTER — Ambulatory Visit (INDEPENDENT_AMBULATORY_CARE_PROVIDER_SITE_OTHER): Payer: Medicare HMO | Admitting: Physician Assistant

## 2023-02-26 VITALS — BP 122/80 | HR 73 | Ht 68.0 in | Wt 198.0 lb

## 2023-02-26 DIAGNOSIS — I10 Essential (primary) hypertension: Secondary | ICD-10-CM | POA: Diagnosis not present

## 2023-02-26 DIAGNOSIS — N951 Menopausal and female climacteric states: Secondary | ICD-10-CM

## 2023-02-26 DIAGNOSIS — E1165 Type 2 diabetes mellitus with hyperglycemia: Secondary | ICD-10-CM | POA: Diagnosis not present

## 2023-02-26 LAB — POCT GLYCOSYLATED HEMOGLOBIN (HGB A1C): Hemoglobin A1C: 7.3 % — AB (ref 4.0–5.6)

## 2023-02-26 MED ORDER — EST ESTROGENS-METHYLTEST 1.25-2.5 MG PO TABS: 1.0000 | ORAL_TABLET | Freq: Every day | ORAL | 3 refills | Status: DC

## 2023-02-26 MED ORDER — METOPROLOL SUCCINATE ER 25 MG PO TB24: 25.0000 mg | ORAL_TABLET | Freq: Every day | ORAL | 3 refills | Status: DC

## 2023-02-26 MED ORDER — AMLODIPINE BESYLATE 10 MG PO TABS: ORAL_TABLET | ORAL | 3 refills | Status: DC

## 2023-02-26 MED ORDER — SITAGLIPTIN PHOSPHATE 100 MG PO TABS: 100.0000 mg | ORAL_TABLET | Freq: Every day | ORAL | 0 refills | Status: DC

## 2023-02-26 NOTE — Progress Notes (Signed)
Established Patient Office Visit  Subjective   Patient ID: Kayla Gallagher, female    DOB: 1951/02/08  Age: 72 y.o. MRN: [id number]  Chief Complaint  Patient presents with   Follow-up    HPI Pt is a 72 y.o. obese female with HTN, T2DM, HLD who presents to the clinic for follow up.   Pt is not checking her sugars. She is taking [place] and metformin. She did not tolerate mounjaro due to depressed mood. Denies any CP, palpitations, headaches or vision changes. She is walking more and eating less.   .. Active Ambulatory Problems    Diagnosis Date Noted   History of hepatitis C 01/07/2013   Essential hypertension, benign 01/07/2013   Mixed hyperlipidemia 01/07/2013   History of breast cancer 01/07/2013   Depression 01/07/2013   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   Hyperplastic colon polyp 01/22/2013   Type 2 diabetes mellitus 02/21/2013   Breast mass, right 02/21/2013   Actinic keratosis 04/16/2013   Temporal arteritis 06/18/2013   Anxiety state 05/04/2015   Obesity 09/09/2015   DDD (degenerative disc disease), cervical 06/22/2017   Acute abdominal pain in left lower quadrant 06/22/2017   Elevated serum creatinine 01/29/2018   Menopausal symptoms 01/29/2018   Metatarsal stress fracture of right foot 01/26/2020   Contusion of rib on left side 08/09/2020   Panic attacks 08/09/2020   Grief reaction 11/10/2020   Uncontrolled type 2 diabetes mellitus with hyperglycemia 10/26/2021   Elevated TSH 06/20/2022   Microalbuminuria 08/17/2022   Resolved Ambulatory Problems    Diagnosis Date Noted   Strain of elbow, left 06/01/2014   Community acquired pneumonia of right middle lobe of lung 12/06/2017   Past Medical History:  Diagnosis Date   Diabetes mellitus without complication    Headache(784.0)    History of anemia    Hyperlipidemia    Hypertension      ROS   See HPI.  Objective:     BP 122/80   Pulse 73   Ht  (1.727 m)   Wt 198 lb (89.8 kg)   SpO2 99%   BMI 30.11  kg/m  BP Readings from Last 3 Encounters:  02/26/23 122/80  11/21/22 134/71  08/17/22 138/78   Wt Readings from Last 3 Encounters:  02/26/23 198 lb (89.8 kg)  11/21/22 199 lb (90.3 kg)  05/17/22 190 lb (86.2 kg)    .Marland Kitchen Results for orders placed or performed in visit on 02/26/23  POCT HgB A1C  Result Value Ref Range   Hemoglobin A1C 7.3 (A) 4.0 - 5.6 %   HbA1c POC (<> result, manual entry)     HbA1c, POC (prediabetic range)     HbA1c, POC (controlled diabetic range)       Physical Exam Constitutional:      Appearance: Normal appearance. She is obese.  HENT:     Head: Normocephalic.  Neck:     Vascular: No carotid bruit.  Cardiovascular:     Rate and Rhythm: Normal rate and regular rhythm.     Pulses: Normal pulses.     Heart sounds: Normal heart sounds.  Pulmonary:     Effort: Pulmonary effort is normal.     Breath sounds: Normal breath sounds.  Musculoskeletal:     Cervical back: Normal range of motion and neck supple. No rigidity or tenderness.     Right lower leg: No edema.     Left lower leg: No edema.  Lymphadenopathy:     Cervical: No cervical adenopathy.  Neurological:     General: No focal deficit present.     Mental Status: She is alert and oriented to person, place, and time.  Psychiatric:        Mood and Affect: Mood normal.        Behavior: Behavior normal.       Assessment & Plan:  [name] KitchenPamelyn was seen today for follow-up.  Diagnoses and all orders for this visit:  Uncontrolled type 2 diabetes mellitus with hyperglycemia -     POCT HgB A1C -     COMPLETE METABOLIC PANEL WITH GFR -     sitaGLIPtin (JANUVIA) 100 MG tablet; Take 1 tablet (100 mg total) by mouth daily.  Essential hypertension, benign -     COMPLETE METABOLIC PANEL WITH GFR -     metoprolol succinate (TOPROL-XL) 25 MG 24 hr tablet; Take 1 tablet (25 mg total) by mouth daily. -     amLODipine (NORVASC) 10 MG tablet; TAKE 1 TABLET BY MOUTH EVERY DAY  Menopausal symptoms -      estrogens-methylTEST (ESTRATEST) 1.25-2.5 MG tablet; Take 1 tablet by mouth daily.   A1C is almost to goal SE to mounjaro Continue metformin and januiva Continue exercise and diet changes BP on 2nd recheck great On statin Foot and eye exam UTD Pt declined covid/flu/shingles vaccine Follow up in 3 months.   Estratest refilled    Return in about 3 months (around 05/28/2023).    Tandy Gaw, PA-C

## 2023-04-18 ENCOUNTER — Other Ambulatory Visit: Payer: Self-pay | Admitting: Physician Assistant

## 2023-04-18 DIAGNOSIS — E782 Mixed hyperlipidemia: Secondary | ICD-10-CM

## 2023-04-18 DIAGNOSIS — F32A Depression, unspecified: Secondary | ICD-10-CM

## 2023-05-27 ENCOUNTER — Other Ambulatory Visit: Payer: Self-pay | Admitting: Sports Medicine

## 2023-05-27 DIAGNOSIS — E1165 Type 2 diabetes mellitus with hyperglycemia: Secondary | ICD-10-CM

## 2023-05-28 ENCOUNTER — Ambulatory Visit: Payer: Medicare HMO | Admitting: Physician Assistant

## 2023-06-05 ENCOUNTER — Encounter: Payer: Self-pay | Admitting: Physician Assistant

## 2023-06-05 ENCOUNTER — Ambulatory Visit (INDEPENDENT_AMBULATORY_CARE_PROVIDER_SITE_OTHER): Payer: Medicare HMO | Admitting: Physician Assistant

## 2023-06-05 VITALS — BP 144/73 | HR 69 | Ht 68.0 in | Wt 200.8 lb

## 2023-06-05 DIAGNOSIS — F3342 Major depressive disorder, recurrent, in full remission: Secondary | ICD-10-CM | POA: Diagnosis not present

## 2023-06-05 DIAGNOSIS — E785 Hyperlipidemia, unspecified: Secondary | ICD-10-CM | POA: Diagnosis not present

## 2023-06-05 DIAGNOSIS — R7989 Other specified abnormal findings of blood chemistry: Secondary | ICD-10-CM

## 2023-06-05 DIAGNOSIS — R809 Proteinuria, unspecified: Secondary | ICD-10-CM

## 2023-06-05 DIAGNOSIS — Z6830 Body mass index (BMI) 30.0-30.9, adult: Secondary | ICD-10-CM

## 2023-06-05 DIAGNOSIS — I10 Essential (primary) hypertension: Secondary | ICD-10-CM | POA: Diagnosis not present

## 2023-06-05 DIAGNOSIS — E1165 Type 2 diabetes mellitus with hyperglycemia: Secondary | ICD-10-CM | POA: Diagnosis not present

## 2023-06-05 DIAGNOSIS — E6609 Other obesity due to excess calories: Secondary | ICD-10-CM | POA: Diagnosis not present

## 2023-06-05 LAB — POCT GLYCOSYLATED HEMOGLOBIN (HGB A1C): Hemoglobin A1C: 8.4 % — AB (ref 4.0–5.6)

## 2023-06-05 MED ORDER — SITAGLIPTIN PHOSPHATE 100 MG PO TABS: 100.0000 mg | ORAL_TABLET | Freq: Every day | ORAL | 1 refills | Status: DC

## 2023-06-05 MED ORDER — FREESTYLE LIBRE 14 DAY SENSOR MISC
1.0000 | 11 refills | Status: DC
Start: 2023-06-05 — End: 2023-12-11

## 2023-06-05 MED ORDER — METFORMIN HCL 1000 MG PO TABS
1000.0000 mg | ORAL_TABLET | Freq: Two times a day (BID) | ORAL | 0 refills | Status: DC
Start: 2023-06-05 — End: 2023-09-10

## 2023-06-05 MED ORDER — PIOGLITAZONE HCL 30 MG PO TABS
30.0000 mg | ORAL_TABLET | Freq: Every day | ORAL | 1 refills | Status: DC
Start: 2023-06-05 — End: 2023-09-10

## 2023-06-05 NOTE — Patient Instructions (Addendum)
Metformin twice a day Januvia once a day Actos once a day   Diabetes Mellitus and Nutrition, Adult When you have diabetes, or diabetes mellitus, it is very important to have healthy eating habits because your blood sugar (glucose) levels are greatly affected by what you eat and drink. Eating healthy foods in the right amounts, at about the same times every day, can help you: Manage your blood glucose. Lower your risk of heart disease. Improve your blood pressure. Reach or maintain a healthy weight. What can affect my meal plan? Every person with diabetes is different, and each person has different needs for a meal plan. Your health care provider may recommend that you work with a dietitian to make a meal plan that is best for you. Your meal plan may vary depending on factors such as: The calories you need. The medicines you take. Your weight. Your blood glucose, blood pressure, and cholesterol levels. Your activity level. Other health conditions you have, such as heart or kidney disease. How do carbohydrates affect me? Carbohydrates, also called carbs, affect your blood glucose level more than any other type of food. Eating carbs raises the amount of glucose in your blood. It is important to know how many carbs you can safely have in each meal. This is different for every person. Your dietitian can help you calculate how many carbs you should have at each meal and for each snack. How does alcohol affect me? Alcohol can cause a decrease in blood glucose (hypoglycemia), especially if you use insulin or take certain diabetes medicines by mouth. Hypoglycemia can be a life-threatening condition. Symptoms of hypoglycemia, such as sleepiness, dizziness, and confusion, are similar to symptoms of having too much alcohol. Do not drink alcohol if: Your health care provider tells you not to drink. You are pregnant, may be pregnant, or are planning to become pregnant. If you drink alcohol: Limit how  much you have to: 0-1 drink a day for women. 0-2 drinks a day for men. Know how much alcohol is in your drink. In the [place] one drink equals one 12 oz bottle of beer (355 mL), one 5 oz glass of wine (148 mL), or one 1 oz glass of hard liquor (44 mL). Keep yourself hydrated with water, diet soda, or unsweetened iced tea. Keep in mind that regular soda, juice, and other mixers may contain a lot of sugar and must be counted as carbs. What are tips for following this plan?  Reading food labels Start by checking the serving size on the Nutrition Facts label of packaged foods and drinks. The number of calories and the amount of carbs, fats, and other nutrients listed on the label are based on one serving of the item. Many items contain more than one serving per package. Check the total grams (g) of carbs in one serving. Check the number of grams of saturated fats and trans fats in one serving. Choose foods that have a low amount or none of these fats. Check the number of milligrams (mg) of salt (sodium) in one serving. Most people should limit total sodium intake to less than 2,300 mg per day. Always check the nutrition information of foods labeled as "low-fat" or "nonfat." These foods may be higher in added sugar or refined carbs and should be avoided. Talk to your dietitian to identify your daily goals for nutrients listed on the label. Shopping Avoid buying canned, pre-made, or processed foods. These foods tend to be high in fat, sodium, and added  sugar. Shop around the outside edge of the grocery store. This is where you will most often find fresh fruits and vegetables, bulk grains, fresh meats, and fresh dairy products. Cooking Use low-heat cooking methods, such as baking, instead of high-heat cooking methods, such as deep frying. Cook using healthy oils, such as olive, canola, or sunflower oil. Avoid cooking with butter, cream, or high-fat meats. Meal planning Eat meals and snacks regularly,  preferably at the same times every day. Avoid going long periods of time without eating. Eat foods that are high in fiber, such as fresh fruits, vegetables, beans, and whole grains. Eat 4-6 oz (112-168 g) of lean protein each day, such as lean meat, chicken, fish, eggs, or tofu. One ounce (oz) (28 g) of lean protein is equal to: 1 oz (28 g) of meat, chicken, or fish. 1 egg.  cup (62 g) of tofu. Eat some foods each day that contain healthy fats, such as avocado, nuts, seeds, and fish. What foods should I eat? Fruits Berries. Apples. Oranges. Peaches. Apricots. Plums. Grapes. Mangoes. Papayas. Pomegranates. Kiwi. Cherries. Vegetables Leafy greens, including lettuce, spinach, kale, chard, collard greens, mustard greens, and cabbage. Beets. Cauliflower. Broccoli. Carrots. Green beans. Tomatoes. Peppers. Onions. Cucumbers. Brussels sprouts. Grains Whole grains, such as whole-wheat or whole-grain bread, crackers, tortillas, cereal, and pasta. Unsweetened oatmeal. Quinoa. Brown or wild rice. Meats and other proteins Seafood. Poultry without skin. Lean cuts of poultry and beef. Tofu. Nuts. Seeds. Dairy Low-fat or fat-free dairy products such as milk, yogurt, and cheese. The items listed above may not be a complete list of foods and beverages you can eat and drink. Contact a dietitian for more information. What foods should I avoid? Fruits Fruits canned with syrup. Vegetables Canned vegetables. Frozen vegetables with butter or cream sauce. Grains Refined white flour and flour products such as bread, pasta, snack foods, and cereals. Avoid all processed foods. Meats and other proteins Fatty cuts of meat. Poultry with skin. Breaded or fried meats. Processed meat. Avoid saturated fats. Dairy Full-fat yogurt, cheese, or milk. Beverages Sweetened drinks, such as soda or iced tea. The items listed above may not be a complete list of foods and beverages you should avoid. Contact a dietitian for more  information. Questions to ask a health care provider Do I need to meet with a certified diabetes care and education specialist? Do I need to meet with a dietitian? What number can I call if I have questions? When are the best times to check my blood glucose? Where to find more information: American Diabetes Association: diabetes.org Academy of Nutrition and Dietetics: eatright.[employer] of Diabetes and Digestive and Kidney Diseases: StageSync.si Association of Diabetes Care & Education Specialists: diabeteseducator.org Summary It is important to have healthy eating habits because your blood sugar (glucose) levels are greatly affected by what you eat and drink. It is important to use alcohol carefully. A healthy meal plan will help you manage your blood glucose and lower your risk of heart disease. Your health care provider may recommend that you work with a dietitian to make a meal plan that is best for you. This information is not intended to replace advice given to you by your health care provider. Make sure you discuss any questions you have with your health care provider. Document Revised: 06/02/2020 Document Reviewed: 06/02/2020 Elsevier Patient Education  2024 [employer].

## 2023-06-05 NOTE — Progress Notes (Signed)
Established Patient Office Visit  Subjective   Patient ID: Kayla Gallagher, female    DOB: 06/22/1951  Age: 72 y.o. MRN: [id number]  Chief Complaint  Patient presents with   Medical Management of Chronic Issues    Pt is here for a 83-month follow up. Denies any concerns for today's visit.    HPI Pt is a 72 yo obese female with T2DM, HTN, HLD, MDD, GAD who presents to the clinic for 3 month follow up and medication refills.   Pt feels good. She is walking often. She is not checking sugars but request [name] to be sent to pharmacy. Denies any hypoglycemic events. Denies any CP, palpitations, headaches or vision changes. She is not taking [place] daily due to cost. She is taking every other day.   Mood is good with no concerns.   .. Active Ambulatory Problems    Diagnosis Date Noted   History of hepatitis C 01/07/2013   Essential hypertension, benign 01/07/2013   Mixed hyperlipidemia 01/07/2013   History of breast cancer 01/07/2013   Depression 01/07/2013   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   Hyperplastic colon polyp 01/22/2013   Type 2 diabetes mellitus (HCC) 02/21/2013   Breast mass, right 02/21/2013   Actinic keratosis 04/16/2013   Temporal arteritis (HCC) 06/18/2013   Anxiety state 05/04/2015   Obesity 09/09/2015   DDD (degenerative disc disease), cervical 06/22/2017   Acute abdominal pain in left lower quadrant 06/22/2017   Elevated serum creatinine 01/29/2018   Menopausal symptoms 01/29/2018   Metatarsal stress fracture of right foot 01/26/2020   Contusion of rib on left side 08/09/2020   Panic attacks 08/09/2020   Grief reaction 11/10/2020   Uncontrolled type 2 diabetes mellitus with hyperglycemia (HCC) 10/26/2021   Elevated TSH 06/20/2022   Microalbuminuria 08/17/2022   Recurrent major depressive disorder, in full remission (HCC) 06/05/2023   Dyslipidemia, goal LDL below 70 06/05/2023   Resolved Ambulatory Problems    Diagnosis Date Noted   Strain of elbow, left 06/01/2014    Community acquired pneumonia of right middle lobe of lung 12/06/2017   Past Medical History:  Diagnosis Date   Diabetes mellitus without complication (HCC)    Headache(784.0)    History of anemia    Hyperlipidemia    Hypertension      ROS See HPI.    Objective:     BP (!) 144/73   Pulse 69   Ht 5\' 8"  (1.727 m)   Wt 200 lb 12 oz (91.1 kg)   SpO2 98%   BMI 30.52 kg/m  BP Readings from Last 3 Encounters:  06/05/23 (!) 144/73  02/26/23 122/80  11/21/22 134/71   Wt Readings from Last 3 Encounters:  06/05/23 200 lb 12 oz (91.1 kg)  02/26/23 198 lb (89.8 kg)  11/21/22 199 lb (90.3 kg)    ..    06/05/2023   11:06 AM 02/26/2023    1:56 PM 11/21/2022    1:35 PM 08/17/2022    8:59 AM 07/10/2022    1:17 PM  Depression screen PHQ 2/9  Decreased Interest 0 0 0 0 0  Down, Depressed, Hopeless 0 0 0 0 0  PHQ - 2 Score 0 0 0 0 0    Physical Exam Constitutional:      Appearance: Normal appearance. She is obese.  HENT:     Head: Normocephalic.  Cardiovascular:     Rate and Rhythm: Normal rate and regular rhythm.     Pulses: Normal pulses.  Heart sounds: Murmur heard.  Pulmonary:     Effort: Pulmonary effort is normal.     Breath sounds: Normal breath sounds.  Musculoskeletal:     Cervical back: Normal range of motion and neck supple.     Right lower leg: No edema.     Left lower leg: No edema.  Neurological:     General: No focal deficit present.     Mental Status: She is alert and oriented to person, place, and time.  Psychiatric:        Mood and Affect: Mood normal.      Results for orders placed or performed in visit on 06/05/23  POCT HgB A1C  Result Value Ref Range   Hemoglobin A1C 8.4 (A) 4.0 - 5.6 %   HbA1c POC (<> result, manual entry)     HbA1c, POC (prediabetic range)     HbA1c, POC (controlled diabetic range)         Assessment & Plan:  [name] KitchenBrynna was seen today for medical management of chronic issues.  Diagnoses and all orders for this  visit:  Uncontrolled type 2 diabetes mellitus with hyperglycemia (HCC) -     POCT HgB A1C -     Lipid panel -     CMP14+EGFR -     Cancel: CBC with Differential/Platelet -     Cancel: TSH -     pioglitazone (ACTOS) 30 MG tablet; Take 1 tablet (30 mg total) by mouth daily. -     sitaGLIPtin (JANUVIA) 100 MG tablet; Take 1 tablet (100 mg total) by mouth daily. -     metFORMIN (GLUCOPHAGE) 1000 MG tablet; Take 1 tablet (1,000 mg total) by mouth 2 (two) times daily with a meal. -     Continuous Glucose Sensor (FREESTYLE LIBRE 14 DAY SENSOR) MISC; 1 application  by Does not apply route every 14 (fourteen) days. Apply upper deltoid every 14 days, use reader to determine blood sugars -     CBC with Differential/Platelet -     TSH  Recurrent major depressive disorder, in full remission (HCC)  Essential hypertension, benign -     Lipid panel -     CMP14+EGFR -     Cancel: CBC with Differential/Platelet -     Cancel: TSH -     CBC with Differential/Platelet -     TSH  Microalbuminuria -     CMP14+EGFR  Elevated TSH -     Cancel: TSH -     TSH  Dyslipidemia, goal LDL below 70 -     Lipid panel  Class 1 obesity due to excess calories with serious comorbidity and body mass index (BMI) of 30.0 to 30.9 in adult   A1C not to goal Added actos 30mg  and encouraged to take [place] daily with metformin twice a day Discussed diabetic diet Encouraged of exercise weekly BP not to goal on ACE/CCB and BB. Will work on lifestyle and recheck in 2 weeks Pt is resistance to adding any more medication On statin low dose Eye and foot exam UTD Need shingles and covid booster, encouraged to get at pharmacy Follow up in 2 weeks BP check and 3 months office check    Return in about 3 months (around 09/05/2023) for DM/HTN with 2 week BP recheck nurse visit.    Tandy Gaw, PA-C

## 2023-06-19 ENCOUNTER — Ambulatory Visit (INDEPENDENT_AMBULATORY_CARE_PROVIDER_SITE_OTHER): Payer: Medicare HMO | Admitting: Family Medicine

## 2023-06-19 VITALS — BP 135/73 | HR 73 | Ht 68.5 in | Wt 201.1 lb

## 2023-06-19 DIAGNOSIS — I10 Essential (primary) hypertension: Secondary | ICD-10-CM

## 2023-06-19 MED ORDER — VALSARTAN 320 MG PO TABS
320.0000 mg | ORAL_TABLET | Freq: Every day | ORAL | 1 refills | Status: DC
Start: 2023-06-19 — End: 2023-09-10

## 2023-06-19 NOTE — Progress Notes (Signed)
Blood pressure not quite at goal.  Her diagnosis of diabetes blood pressure goal is less than 130 systolic.  Discontinue lisinopril and switch to an ARB to see if we can get maybe another 5-10 point blood pressure reduction.  Meds ordered this encounter  Medications   valsartan (DIOVAN) 320 MG tablet    Sig: Take 1 tablet (320 mg total) by mouth daily.    Dispense:  90 tablet    Refill:  1    This will be in place of lisinopril so do not dispense any additional lisinopril refills.

## 2023-06-19 NOTE — Progress Notes (Signed)
Patient is here for blood pressure check. Denies trouble sleeping, palpitations, dizziness, lightheadedness, blurry vision, chest pain, shortness of breath, headaches and/or medication problems.   Patient's blood pressure was within goal range at 135/73, pulse 73. Patient informed to schedule next appointment in 3 months for DM/HTN check.

## 2023-06-20 NOTE — Progress Notes (Signed)
Attempted to contact the patient, no answer. Left a detailed vm msg regarding the provider's recommendation. Patient was informed that blood medication is being changed from lisinopril to valsartan. Direct call back information provided to the patient for any inquiries.

## 2023-06-25 DIAGNOSIS — E1165 Type 2 diabetes mellitus with hyperglycemia: Secondary | ICD-10-CM | POA: Diagnosis not present

## 2023-06-25 DIAGNOSIS — R7989 Other specified abnormal findings of blood chemistry: Secondary | ICD-10-CM | POA: Diagnosis not present

## 2023-06-25 DIAGNOSIS — R809 Proteinuria, unspecified: Secondary | ICD-10-CM | POA: Diagnosis not present

## 2023-06-25 DIAGNOSIS — E785 Hyperlipidemia, unspecified: Secondary | ICD-10-CM | POA: Diagnosis not present

## 2023-06-25 DIAGNOSIS — I10 Essential (primary) hypertension: Secondary | ICD-10-CM | POA: Diagnosis not present

## 2023-06-26 MED ORDER — ATORVASTATIN CALCIUM 20 MG PO TABS: 20.0000 mg | ORAL_TABLET | Freq: Every day | ORAL | 3 refills | Status: DC

## 2023-06-26 NOTE — Addendum Note (Signed)
Addended by: Jomarie Longs on: 06/26/2023 12:30 PM   Modules accepted: Orders

## 2023-06-26 NOTE — Progress Notes (Signed)
Oleta,   LDL, bad cholesterol, not quite to goal of under 70. I would like to increase lipitor to 20mg  daily. Are you ok with this?   Normal hemoglobin and WBC.   TSH in normal range but not optimal. If having any hypothyroid symptoms consider thyroid replacement. Thoughts?   Calcium elevated. Make sure taking vitamin D to get calcium into bone. Recheck PTH and calcium at follow up visit.

## 2023-06-27 ENCOUNTER — Telehealth: Payer: Self-pay | Admitting: Physician Assistant

## 2023-06-27 MED ORDER — LEVOTHYROXINE SODIUM 25 MCG PO TABS: 25.0000 ug | ORAL_TABLET | Freq: Every day | ORAL | 0 refills | Status: DC

## 2023-06-27 NOTE — Telephone Encounter (Signed)
Patient called back to say she would like to start medicine for her thyroid please advise Pharmacy [facility] in [place] Phone [phone]

## 2023-06-27 NOTE — Telephone Encounter (Signed)
Take in the morning before breakfast. Recheck labs in 3 months.

## 2023-06-28 ENCOUNTER — Ambulatory Visit (INDEPENDENT_AMBULATORY_CARE_PROVIDER_SITE_OTHER): Payer: Medicare HMO

## 2023-06-28 ENCOUNTER — Ambulatory Visit (INDEPENDENT_AMBULATORY_CARE_PROVIDER_SITE_OTHER): Payer: Medicare HMO | Admitting: Sports Medicine

## 2023-06-28 DIAGNOSIS — M5030 Other cervical disc degeneration, unspecified cervical region: Secondary | ICD-10-CM

## 2023-06-28 DIAGNOSIS — M47812 Spondylosis without myelopathy or radiculopathy, cervical region: Secondary | ICD-10-CM | POA: Diagnosis not present

## 2023-06-28 MED ORDER — PREDNISONE 50 MG PO TABS: ORAL_TABLET | ORAL | 0 refills | Status: DC

## 2023-06-28 MED ORDER — TIZANIDINE HCL 4 MG PO TABS
4.0000 mg | ORAL_TABLET | Freq: Every evening | ORAL | 2 refills | Status: DC
Start: 2023-06-28 — End: 2023-09-10

## 2023-06-28 MED ORDER — MELOXICAM 15 MG PO TABS: ORAL_TABLET | ORAL | 3 refills | Status: DC

## 2023-06-28 NOTE — Assessment & Plan Note (Signed)
This is a very 72 year old female, she has known cervical DDD, historically she had right sided C7 distribution radiculitis, I treated her about 6 years ago with steroids, PT and she did extremely well until a few months ago. Now having increasing neck pain, axial with radiation down the left arm to the second and third fingers consistent with a C7 radiculitis. We will proceed again with conservative treatment including prednisone, home PT, meloxicam, Zanaflex. Return to see me in 6 weeks, MR for interventional planning if not better. Of note her son-in-law is a PTA and should be able to help.

## 2023-06-28 NOTE — Progress Notes (Signed)
    Procedures performed today:    None.  Independent interpretation of notes and tests performed by another provider:   None.  Brief History, Exam, Impression, and Recommendations:    DDD (degenerative disc disease), cervical This is a very 72 year old female, she has known cervical DDD, historically she had right sided C7 distribution radiculitis, I treated her about 6 years ago with steroids, PT and she did extremely well until a few months ago. Now having increasing neck pain, axial with radiation down the left arm to the second and third fingers consistent with a C7 radiculitis. We will proceed again with conservative treatment including prednisone, home PT, meloxicam, Zanaflex. Return to see me in 6 weeks, MR for interventional planning if not better. Of note her son-in-law is a PTA and should be able to help.  Chronic process with exacerbation and pharmacologic intervention  ____________________________________________ Ihor Austin. Benjamin Stain, M.D., ABFM., CAQSM., AME. Primary Care and Sports Medicine [facility]  Adjunct Professor of Family Medicine  [employer]  Restaurant manager, fast food

## 2023-07-17 ENCOUNTER — Ambulatory Visit (INDEPENDENT_AMBULATORY_CARE_PROVIDER_SITE_OTHER): Payer: Medicare HMO | Admitting: Physician Assistant

## 2023-07-17 DIAGNOSIS — Z0000 Encounter for general adult medical examination without abnormal findings: Secondary | ICD-10-CM | POA: Diagnosis not present

## 2023-07-17 NOTE — Patient Instructions (Addendum)
MEDICARE ANNUAL WELLNESS VISIT Health Maintenance Summary and Written Plan of Care  Kayla Gallagher ,  Thank you for allowing me to perform your Medicare Annual Wellness Visit and for your ongoing commitment to your health.   Health Maintenance & Immunization History Health Maintenance  Topic Date Due   COVID-19 Vaccine (1) 08/02/2023 (Originally 10/21/1956)   DEXA SCAN  09/09/2023 (Originally 02/07/2023)   Zoster Vaccines- Shingrix (1 of 2) 10/16/2023 (Originally 10/20/1970)   Pneumonia Vaccine 35+ Years old (1 of 1 - PCV) 11/22/2023 (Originally 10/21/2015)   INFLUENZA VACCINE  02/11/2024 (Originally 06/14/2023)   OPHTHALMOLOGY EXAM  07/21/2023   Diabetic kidney evaluation - Urine ACR  08/18/2023   MAMMOGRAM  10/27/2023   HEMOGLOBIN A1C  12/06/2023   FOOT EXAM  02/26/2024   Diabetic kidney evaluation - eGFR measurement  06/24/2024   Medicare Annual Wellness (AWV)  07/16/2024   Colonoscopy  12/18/2026   DTaP/Tdap/Td (3 - Td or Tdap) 04/28/2027   Hepatitis C Screening  Completed   HPV VACCINES  Aged Out   Immunization History  Administered Date(s) Administered   Influenza,inj,Quad PF,6+ Mos 07/18/2013   Tdap 06/19/2010, 04/27/2017    These are the patient goals that we discussed:  Goals Addressed               This Visit's Progress     Patient Stated (pt-stated)        Patient stated that she would like to loose some weight.         This is a list of Health Maintenance Items that are overdue or due now: Screening mammography - scheduled for 08/01/23 Influenza vaccine - patient declined Shingles vaccine - patient declined  Orders/Referrals Placed Today: Orders Placed This Encounter  Procedures   AMB Referral to Community Care Coordinaton ([facility] Patients)    Referral Priority:   Routine    Referral Type:   Consultation    Referral Reason:   Care Coordination    Number of Visits Requested:   1   (Contact our referral department at [phone] if you have not spoken with  someone about your referral appointment within the next 5 days)    Follow-up Plan Follow-up with Jomarie Longs, PA-C as planned Medicare wellness visit in one year.  Patient will access AVS on my chart.      Health Maintenance, Female Adopting a healthy lifestyle and getting preventive care are important in promoting health and wellness. Ask your health care provider about: The right schedule for you to have regular tests and exams. Things you can do on your own to prevent diseases and keep yourself healthy. What should I know about diet, weight, and exercise? Eat a healthy diet  Eat a diet that includes plenty of vegetables, fruits, low-fat dairy products, and lean protein. Do not eat a lot of foods that are high in solid fats, added sugars, or sodium. Maintain a healthy weight Body mass index (BMI) is used to identify weight problems. It estimates body fat based on height and weight. Your health care provider can help determine your BMI and help you achieve or maintain a healthy weight. Get regular exercise Get regular exercise. This is one of the most important things you can do for your health. Most adults should: Exercise for at least 150 minutes each week. The exercise should increase your heart rate and make you sweat (moderate-intensity exercise). Do strengthening exercises at least twice a week. This is in addition to the moderate-intensity exercise. Spend  less time sitting. Even light physical activity can be beneficial. Watch cholesterol and blood lipids Have your blood tested for lipids and cholesterol at 74 years of age, then have this test every 5 years. Have your cholesterol levels checked more often if: Your lipid or cholesterol levels are high. You are older than 73 years of age. You are at high risk for heart disease. What should I know about cancer screening? Depending on your health history and family history, you may need to have cancer screening at various  ages. This may include screening for: Breast cancer. Cervical cancer. Colorectal cancer. Skin cancer. Lung cancer. What should I know about heart disease, diabetes, and high blood pressure? Blood pressure and heart disease High blood pressure causes heart disease and increases the risk of stroke. This is more likely to develop in people who have high blood pressure readings or are overweight. Have your blood pressure checked: Every 3-5 years if you are 48-67 years of age. Every year if you are 65 years old or older. Diabetes Have regular diabetes screenings. This checks your fasting blood sugar level. Have the screening done: Once every three years after age 38 if you are at a normal weight and have a low risk for diabetes. More often and at a younger age if you are overweight or have a high risk for diabetes. What should I know about preventing infection? Hepatitis B If you have a higher risk for hepatitis B, you should be screened for this virus. Talk with your health care provider to find out if you are at risk for hepatitis B infection. Hepatitis C Testing is recommended for: Everyone born from [birth year] through 1966. Anyone with known risk factors for hepatitis C. Sexually transmitted infections (STIs) Get screened for STIs, including gonorrhea and chlamydia, if: You are sexually active and are younger than 73 years of age. You are older than 73 years of age and your health care provider tells you that you are at risk for this type of infection. Your sexual activity has changed since you were last screened, and you are at increased risk for chlamydia or gonorrhea. Ask your health care provider if you are at risk. Ask your health care provider about whether you are at high risk for HIV. Your health care provider may recommend a prescription medicine to help prevent HIV infection. If you choose to take medicine to prevent HIV, you should first get tested for HIV. You should then be tested  every 3 months for as long as you are taking the medicine. Pregnancy If you are about to stop having your period (premenopausal) and you may become pregnant, seek counseling before you get pregnant. Take 400 to 800 micrograms (mcg) of folic acid every day if you become pregnant. Ask for birth control (contraception) if you want to prevent pregnancy. Osteoporosis and menopause Osteoporosis is a disease in which the bones lose minerals and strength with aging. This can result in bone fractures. If you are 31 years old or older, or if you are at risk for osteoporosis and fractures, ask your health care provider if you should: Be screened for bone loss. Take a calcium or vitamin D supplement to lower your risk of fractures. Be given hormone replacement therapy (HRT) to treat symptoms of menopause. Follow these instructions at home: Alcohol use Do not drink alcohol if: Your health care provider tells you not to drink. You are pregnant, may be pregnant, or are planning to become pregnant. If you drink  alcohol: Limit how much you have to: 0-1 drink a day. Know how much alcohol is in your drink. In the [place] one drink equals one 12 oz bottle of beer (355 mL), one 5 oz glass of wine (148 mL), or one 1 oz glass of hard liquor (44 mL). Lifestyle Do not use any products that contain nicotine or tobacco. These products include cigarettes, chewing tobacco, and vaping devices, such as e-cigarettes. If you need help quitting, ask your health care provider. Do not use street drugs. Do not share needles. Ask your health care provider for help if you need support or information about quitting drugs. General instructions Schedule regular health, dental, and eye exams. Stay current with your vaccines. Tell your health care provider if: You often feel depressed. You have ever been abused or do not feel safe at home. Summary Adopting a healthy lifestyle and getting preventive care are important in promoting  health and wellness. Follow your health care provider's instructions about healthy diet, exercising, and getting tested or screened for diseases. Follow your health care provider's instructions on monitoring your cholesterol and blood pressure. This information is not intended to replace advice given to you by your health care provider. Make sure you discuss any questions you have with your health care provider. Document Revised: 03/21/2021 Document Reviewed: 03/21/2021 Elsevier Patient Education  2024 [employer].

## 2023-07-17 NOTE — Progress Notes (Signed)
MEDICARE ANNUAL WELLNESS VISIT  07/17/2023  Telephone Visit Disclaimer This Medicare AWV was conducted by telephone due to national recommendations for restrictions regarding the COVID-19 Pandemic (e.g. social distancing).  I verified, using two identifiers, that I am speaking with Kayla Gallagher or their authorized healthcare agent. I discussed the limitations, risks, security, and privacy concerns of performing an evaluation and management service by telephone and the potential availability of an in-person appointment in the future. The patient expressed understanding and agreed to proceed.  Location of Patient: home Location of Provider (nurse):  In the office.  Subjective:    Kayla Gallagher is a 72 y.o. female patient of [name] Essex, Lonna Cobb, PA-C who had a [employer] Visit today via telephone. Kayla Gallagher is Retired and lives alone. she has 1 child. she reports that she is socially active and does interact with friends/family regularly. she is moderately physically active and enjoys watching sports.  Patient Care Team: Kayla Gallagher as PCP - General (Family Medicine)     07/17/2023    1:58 PM 07/10/2022    1:17 PM 07/02/2017    9:35 AM 06/30/2013    7:09 AM  Advanced Directives  Does Patient Have a Medical Advance Directive? Yes Yes Yes --  Type of Advance Directive Living will Living will Healthcare Power of Breaks;Living will   Does patient want to make changes to medical advance directive? No - Patient declined No - Patient declined    Copy of Healthcare Power of Attorney in Chart?   No - copy requested     Hospital Utilization Over the Past 12 Months: # of hospitalizations or ER visits: 0 # of surgeries: 0  Review of Systems    Patient reports that her overall health is unchanged compared to last year.  History obtained from chart review and the patient  Patient Reported Readings (BP, Pulse, CBG, Weight, etc) none Per patient no change in vitals since  last visit, unable to obtain new vitals due to telehealth visit and lack of equipment.  Pain Assessment Pain : No/denies pain     Current Medications & Allergies (verified) Allergies as of 07/17/2023       Reactions   Morphine And Codeine Nausea And Vomiting   Farxiga [dapagliflozin]    Dry mouth   Hydrochlorothiazide Other (See Comments)   Worsens bladder leakage   Mounjaro [tirzepatide]    Depressed mood        Medication List        Accurate as of July 17, 2023  2:16 PM. If you have any questions, ask your nurse or doctor.          ALPRAZolam 0.5 MG tablet Commonly known as: XANAX Take 1 tablet (0.5 mg total) by mouth at bedtime as needed for anxiety.   amLODipine 10 MG tablet Commonly known as: NORVASC TAKE 1 TABLET BY MOUTH EVERY DAY   atorvastatin 20 MG tablet Commonly known as: LIPITOR Take 1 tablet (20 mg total) by mouth at bedtime.   Biotin 10 MG Tabs Take by mouth.   estrogens-methylTEST 1.25-2.5 MG tablet Commonly known as: ESTRATEST Take 1 tablet by mouth daily.   FreeStyle Libre 14 Day Sensor Misc 1 application  by Does not apply route every 14 (fourteen) days. Apply upper deltoid every 14 days, use reader to determine blood sugars   levothyroxine 25 MCG tablet Commonly known as: SYNTHROID Take 1 tablet (25 mcg total) by mouth daily.   meloxicam 15  MG tablet Commonly known as: MOBIC One tab PO every 24 hours with a meal for 2 weeks, then once every 24 hours prn pain.   metFORMIN 1000 MG tablet Commonly known as: GLUCOPHAGE Take 1 tablet (1,000 mg total) by mouth 2 (two) times daily with a meal.   metoprolol succinate 25 MG 24 hr tablet Commonly known as: TOPROL-XL Take 1 tablet (25 mg total) by mouth daily.   MULTIVITAMIN PO Take 1 tablet by mouth daily.   PARoxetine 40 MG tablet Commonly known as: PAXIL TAKE 1 TABLET BY MOUTH EVERY DAY IN THE MORNING   pioglitazone 30 MG tablet Commonly known as: ACTOS Take 1 tablet (30 mg  total) by mouth daily.   predniSONE 50 MG tablet Commonly known as: DELTASONE One tab PO daily for 5 days.   sitaGLIPtin 100 MG tablet Commonly known as: JANUVIA Take 1 tablet (100 mg total) by mouth daily.   tiZANidine 4 MG tablet Commonly known as: ZANAFLEX Take 1 tablet (4 mg total) by mouth Nightly for 10 days. What changed: additional instructions   valsartan 320 MG tablet Commonly known as: DIOVAN Take 1 tablet (320 mg total) by mouth daily.        History (reviewed): Past Medical History:  Diagnosis Date   Depression    Diabetes mellitus without complication (HCC)    Essential hypertension, benign 01/07/2013   Headache(784.0)    History of anemia    History of hepatitis C 01/07/2013   Hyperlipidemia    Hypertension    Does not see cardiology   Past Surgical History:  Procedure Laterality Date   ABDOMINAL HYSTERECTOMY  1976   ABDOMINAL HYSTERECTOMY     ARTERY BIOPSY Right 06/30/2013   Procedure: BIOPSY TEMPORAL ARTERY;  Surgeon: Larina Earthly, MD;  Location: [facility] OR;  Service: Vascular;  Laterality: Right;   BREAST BIOPSY Right    BREAST REDUCTION SURGERY     BUNIONECTOMY     REDUCTION MAMMAPLASTY     tummy tuck     Family History  Problem Relation Age of Onset   Heart attack Father    Diabetes Father        grandmother   Cancer Father    Heart disease Father    Hyperlipidemia Father    Hypertension Father    Cancer Mother    Alcoholism Other        parents   Prostate cancer Other        grandfather   Hyperlipidemia Other        grandfather   Breast cancer Sister    Social History   Socioeconomic History   Marital status: Divorced    Spouse name: Not on file   Number of children: 1   Years of education: 13   Highest education level: Some college, no degree  Occupational History   Occupation: Retired  Tobacco Use   Smoking status: Never   Smokeless tobacco: Never  Vaping Use   Vaping status: Never Used  Substance and Sexual Activity    Alcohol use: No   Drug use: No   Sexual activity: Not Currently  Other Topics Concern   Not on file  Social History Narrative   Lives alone. Her daughter lives close by. Likes to enjoy watching sports.   Social Determinants of Health   Financial Resource Strain: Low Risk  (07/17/2023)   Overall Financial Resource Strain (CARDIA)    Difficulty of Paying Living Expenses: Not very hard  Recent Concern: Financial  Resource Strain - High Risk (06/04/2023)   Overall Financial Resource Strain (CARDIA)    Difficulty of Paying Living Expenses: Very hard  Food Insecurity: Food Insecurity Present (07/17/2023)   Hunger Vital Sign    Worried About Running Out of Food in the Last Year: Often true    Ran Out of Food in the Last Year: Sometimes true  Transportation Needs: No Transportation Needs (07/17/2023)   PRAPARE - Administrator, Civil Service (Medical): No    Lack of Transportation (Non-Medical): No  Physical Activity: Insufficiently Active (07/17/2023)   Exercise Vital Sign    Days of Exercise per Week: 4 days    Minutes of Exercise per Session: 30 min  Stress: No Stress Concern Present (07/17/2023)   [employer] of Occupational Health - Occupational Stress Questionnaire    Feeling of Stress : Not at all  Social Connections: Moderately Isolated (07/17/2023)   Social Connection and Isolation Panel [NHANES]    Frequency of Communication with Friends and Family: More than three times a week    Frequency of Social Gatherings with Friends and Family: More than three times a week    Attends Religious Services: More than 4 times per year    Active Member of [employer] or Organizations: No    Attends Banker Meetings: Never    Marital Status: Divorced    Activities of Daily Living    07/17/2023    2:05 PM  In your present state of health, do you have any difficulty performing the following activities:  Hearing? 0  Vision? 1  Comment has an eye doctor appt scheduled   Difficulty concentrating or making decisions? 0  Walking or climbing stairs? 0  Dressing or bathing? 0  Doing errands, shopping? 0  Preparing Food and eating ? N  Using the Toilet? N  In the past six months, have you accidently leaked urine? Y  Comment sometimes  Do you have problems with loss of bowel control? N  Managing your Medications? N  Managing your Finances? N  Housekeeping or managing your Housekeeping? N    Patient Education/ Literacy How often do you need to have someone help you when you read instructions, pamphlets, or other written materials from your doctor or pharmacy?: 1 - Never What is the last grade level you completed in school?: 3 years of college  Exercise    Diet Patient reports consuming 3 meals a day and 1-2 snack(s) a day Patient reports that her primary diet is: Regular Patient reports that she does have regular access to food.   Depression Screen    07/17/2023    1:59 PM 06/05/2023   11:06 AM 02/26/2023    1:56 PM 11/21/2022    1:35 PM 08/17/2022    8:59 AM 07/10/2022    1:17 PM 05/17/2022    2:34 PM  PHQ 2/9 Scores  PHQ - 2 Score 0 0 0 0 0 0 0  PHQ- 9 Score       1     Fall Risk    07/17/2023    1:59 PM 06/05/2023   11:06 AM 11/21/2022    1:35 PM 08/17/2022    8:59 AM 07/10/2022    1:17 PM  Fall Risk   Falls in the past year? 0 0 0 0 0  Number falls in past yr: 0 0 0 0 0  Injury with Fall? 0 0 0 0 0  Risk for fall due to : No Fall Risks  No Fall Risks No Fall Risks No Fall Risks No Fall Risks  Follow up Falls evaluation completed Falls evaluation completed Falls evaluation completed Falls evaluation completed Falls evaluation completed     Objective:  Kayla Gallagher seemed alert and oriented and she participated appropriately during our telephone visit.  Blood Pressure Weight BMI  BP Readings from Last 3 Encounters:  06/19/23 135/73  06/05/23 (!) 144/73  02/26/23 122/80   Wt Readings from Last 3 Encounters:  06/19/23 201 lb 1.9 oz (91.2  kg)  06/05/23 200 lb 12 oz (91.1 kg)  02/26/23 198 lb (89.8 kg)   BMI Readings from Last 1 Encounters:  06/19/23 30.14 kg/m    *Unable to obtain current vital signs, weight, and BMI due to telephone visit type  Hearing/Vision  Kayla Gallagher did not seem to have difficulty with hearing/understanding during the telephone conversation Reports that she has not had a formal eye exam by an eye care professional within the past year Reports that she has not had a formal hearing evaluation within the past year *Unable to fully assess hearing and vision during telephone visit type  Cognitive Function:    07/17/2023    2:08 PM 07/10/2022    1:25 PM  6CIT Screen  What Year? 0 points 0 points  What month? 0 points 0 points  What time? 0 points 0 points  Count back from 20 0 points 0 points  Months in reverse 2 points 0 points  Repeat phrase 4 points 2 points  Total Score 6 points 2 points   (Normal:0-7, Significant for Dysfunction: >8)  Normal Cognitive Function Screening: Yes   Immunization & Health Maintenance Record Immunization History  Administered Date(s) Administered   Influenza,inj,Quad PF,6+ Mos 07/18/2013   Tdap 06/19/2010, 04/27/2017    Health Maintenance  Topic Date Due   COVID-19 Vaccine (1) 08/02/2023 (Originally 10/21/1956)   DEXA SCAN  09/09/2023 (Originally 02/07/2023)   Zoster Vaccines- Shingrix (1 of 2) 10/16/2023 (Originally 10/20/1969)   Pneumonia Vaccine 47+ Years old (1 of 1 - PCV) 11/22/2023 (Originally 10/21/2015)   INFLUENZA VACCINE  02/11/2024 (Originally 06/14/2023)   OPHTHALMOLOGY EXAM  07/21/2023   Diabetic kidney evaluation - Urine ACR  08/18/2023   MAMMOGRAM  10/27/2023   HEMOGLOBIN A1C  12/06/2023   FOOT EXAM  02/26/2024   Diabetic kidney evaluation - eGFR measurement  06/24/2024   Medicare Annual Wellness (AWV)  07/16/2024   Colonoscopy  12/18/2026   DTaP/Tdap/Td (3 - Td or Tdap) 04/28/2027   Hepatitis C Screening  Completed   HPV VACCINES  Aged Out        Assessment  This is a routine wellness examination for Kayla Gallagher.  Health Maintenance: Due or Overdue There are no preventive care reminders to display for this patient.   Kayla Gallagher does not need a referral for Community Assistance: Care Management:   no Social Work:    no Prescription Assistance:  no Nutrition/Diabetes Education:  no   Plan:  Personalized Goals  Goals Addressed               This Visit's Progress     Patient Stated (pt-stated)        Patient stated that she would like to loose some weight.       Personalized Health Maintenance & Screening Recommendations  Screening mammography - scheduled for 08/01/23 Influenza vaccine - patient declined Shingles vaccine - patient declined  Lung Cancer Screening Recommended: no (Low Dose CT Chest recommended if  Age 4-80 years, 20 pack-year currently smoking OR have quit w/in past 15 years) Hepatitis C Screening recommended: no HIV Screening recommended: no  Advanced Directives: Written information was not prepared per patient's request.  Referrals & Orders Orders Placed This Encounter  Procedures   AMB Referral to Community Care Coordinaton (ACO Patients)    Follow-up Plan Follow-up with Jomarie Longs, PA-C as planned Medicare wellness visit in one year.  Patient will access AVS on my chart.   I have personally reviewed and noted the following in the patient's chart:   Medical and social history Use of alcohol, tobacco or illicit drugs  Current medications and supplements Functional ability and status Nutritional status Physical activity Advanced directives List of other physicians Hospitalizations, surgeries, and ER visits in previous 12 months Vitals Screenings to include cognitive, depression, and falls Referrals and appointments  In addition, I have reviewed and discussed with Kayla Gallagher certain preventive protocols, quality metrics, and best practice recommendations. A  written personalized care plan for preventive services as well as general preventive health recommendations is available and can be mailed to the patient at her request.      Modesto Charon, RN BSN  07/17/2023

## 2023-07-18 ENCOUNTER — Telehealth: Payer: Self-pay | Admitting: *Deleted

## 2023-07-18 NOTE — Progress Notes (Signed)
  Care Coordination   Note   07/18/2023 Name: ADLENE WURZEL MRN: [id number] DOB: 1951-04-17  Bary Castilla Parham is a 72 y.o. year old female who sees [place], Lonna Cobb, [place] for primary care. I reached out to [employer] by phone today to offer care coordination services.  Ms. Planer was given information about Care Coordination services today including:   The Care Coordination services include support from the care team which includes your Nurse Coordinator, Clinical Social Worker, or Pharmacist.  The Care Coordination team is here to help remove barriers to the health concerns and goals most important to you. Care Coordination services are voluntary, and the patient may decline or stop services at any time by request to their care team member.   Care Coordination Consent Status: Patient agreed to services and verbal consent obtained.   Follow up plan:  Telephone appointment with care coordination team member scheduled for:  07/26/2023  Encounter Outcome:  Patient Scheduled from referral   Burman Nieves, [facility] Care Coordination Care Guide Direct Dial: [phone]

## 2023-07-18 NOTE — Progress Notes (Signed)
  Care Coordination  Outreach Note  07/18/2023 Name: Kayla Gallagher MRN: [id number] DOB: [birth year]   Care Coordination Outreach Attempts: An unsuccessful telephone outreach was attempted today to offer the patient information about available care coordination services.  Follow Up Plan:  Additional outreach attempts will be made to offer the patient care coordination information and services.   Encounter Outcome:  No Answer  Burman Nieves, CCMA Care Coordination Care Guide Direct Dial: [phone]

## 2023-07-26 ENCOUNTER — Ambulatory Visit: Payer: Self-pay

## 2023-07-26 NOTE — Patient Instructions (Signed)
Visit Information  Thank you for taking time to visit with me today. Please don't hesitate to contact me if I can be of assistance to you.   Following are the goals we discussed today:  - Review provided resources - Contact your primary care provider as needed  If you are experiencing a Mental Health or Behavioral Health Crisis or need someone to talk to, please call 1-800-273-TALK (toll free, 24 hour hotline) call 911  Patient verbalizes understanding of instructions and care plan provided today and agrees to view in MyChart. Active MyChart status and patient understanding of how to access instructions and care plan via MyChart confirmed with patient.     No further follow up required: Please contact me as needed  Bevelyn Ngo, BSW, CDP Social Worker, Certified Dementia Practitioner [facility] Care Management  Care Coordination [phone]

## 2023-07-26 NOTE — Patient Outreach (Signed)
  Care Coordination   Initial Visit Note   07/26/2023 Name: Kayla Gallagher MRN: [id number] DOB: 30-Dec-1950  Kayla Gallagher is a 72 y.o. year old female who sees [place], Lonna Cobb, [place] for primary care. I spoke with  Kayla Gallagher by phone today.  What matters to the patients health and wellness today?  Identify resources to assist with costs of living    Goals Addressed             This Visit's Progress    COMPLETED: Care Coordination Activities       Care Coordination Interventions: [facility] screening performed - discussed the patient has challenges affording food and is currently behind on her water bill Determined the patients daughter assists with food as the patient is unable to afford Provided the patient with a list of [employer] Advised how to apply for FNS benefits via online portal Discussed the patients recent water bill was higher than normal - provided with information on Passenger transport manager. Patient to contact resource to determine eligibility Reviewed health plan benefits with the patient providing education on Over the Counter benefit; patient educated that she receives $75 per quarter to be spent on OTC items. Patient provided with summary of benefits for her review Discussed plan for the patient to contact SW as needed         SDOH assessments and interventions completed:  Yes  SDOH Interventions Today    Flowsheet Row Most Recent Value  SDOH Interventions   Food Insecurity Interventions Other (Comment)  [Provided education on local food pantry sites,  advised how to apply for FNS]  Housing Interventions Intervention Not Indicated  Transportation Interventions Intervention Not Indicated  Utilities Interventions Other (Comment)  [Pt behind on water bill,  provided with resources]        Care Coordination Interventions:  Yes, provided   Interventions Today    Flowsheet Row Most Recent Value  Chronic Disease   Chronic disease  during today's visit Other  [Financial Resource Strain]  General Interventions   General Interventions Discussed/Reviewed General Interventions Discussed, Community Resources        Follow up plan: No further intervention required.   Encounter Outcome:  Patient Visit Completed   Bevelyn Ngo, Kenard Gower, CDP Social Worker, Certified Dementia Practitioner Kayla Gallagher  Care Coordination [phone]

## 2023-08-01 ENCOUNTER — Ambulatory Visit: Payer: Medicare HMO

## 2023-08-09 ENCOUNTER — Ambulatory Visit (INDEPENDENT_AMBULATORY_CARE_PROVIDER_SITE_OTHER): Payer: Medicare HMO | Admitting: Sports Medicine

## 2023-08-09 DIAGNOSIS — M5030 Other cervical disc degeneration, unspecified cervical region: Secondary | ICD-10-CM | POA: Diagnosis not present

## 2023-08-09 NOTE — Progress Notes (Signed)
    Procedures performed today:    None.  Independent interpretation of notes and tests performed by another provider:   None.  Brief History, Exam, Impression, and Recommendations:    DDD (degenerative disc disease), cervical This is a very pleasant 72 year old female, "monkey" as she is affectionately known by her granddaughter has known cervical DDD, and historically has had right sided C7 distribution radiculitis, she did well about 6 years ago with steroids and physical therapy, then she started having increasing neck pain, axial with radiation down the left arm to the second and third fingers consistent with recurrence of a C7 radiculitis, we added prednisone, home PT, meloxicam, Zanaflex. Her son is a PTA. She has improved dramatically. She notes that Zanaflex is particularly efficacious, but this suggests to [place] is that though there are multiple pain generators in the cervical spine, the disc, the facet joints, the nerve roots, the muscles, the efficacy of Zanaflex suggest that the majority of her pain is related to a muscular/myofascial source.    ____________________________________________ Ihor Austin. Benjamin Stain, M.D., ABFM., CAQSM., AME. Primary Care and Sports Medicine [facility]  Adjunct Professor of Family Medicine  [place] [employer]  Restaurant manager, fast food

## 2023-08-09 NOTE — Assessment & Plan Note (Signed)
This is a very pleasant 72 year old female, "monkey" as she is affectionately known by her granddaughter has known cervical DDD, and historically has had right sided C7 distribution radiculitis, she did well about 6 years ago with steroids and physical therapy, then she started having increasing neck pain, axial with radiation down the left arm to the second and third fingers consistent with recurrence of a C7 radiculitis, we added prednisone, home PT, meloxicam, Zanaflex. Her son is a PTA. She has improved dramatically. She notes that Zanaflex is particularly efficacious, but this suggests to [place] is that though there are multiple pain generators in the cervical spine, the disc, the facet joints, the nerve roots, the muscles, the efficacy of Zanaflex suggest that the majority of her pain is related to a muscular/myofascial source.

## 2023-09-03 ENCOUNTER — Other Ambulatory Visit: Payer: Self-pay

## 2023-09-03 ENCOUNTER — Other Ambulatory Visit: Payer: Self-pay | Admitting: Physician Assistant

## 2023-09-03 DIAGNOSIS — N951 Menopausal and female climacteric states: Secondary | ICD-10-CM

## 2023-09-03 MED ORDER — EST ESTROGENS-METHYLTEST 1.25-2.5 MG PO TABS: 1.0000 | ORAL_TABLET | Freq: Every day | ORAL | 1 refills | Status: DC

## 2023-09-05 ENCOUNTER — Encounter: Payer: Self-pay | Admitting: *Deleted

## 2023-09-05 ENCOUNTER — Ambulatory Visit
Admission: EM | Admit: 2023-09-05 | Discharge: 2023-09-05 | Disposition: A | Discharge status: Home / Self Care | Payer: Medicare HMO

## 2023-09-05 ENCOUNTER — Other Ambulatory Visit: Payer: Self-pay

## 2023-09-05 DIAGNOSIS — S0990XA Unspecified injury of head, initial encounter: Secondary | ICD-10-CM

## 2023-09-05 DIAGNOSIS — R220 Localized swelling, mass and lump, head: Secondary | ICD-10-CM

## 2023-09-05 DIAGNOSIS — S59912A Unspecified injury of left forearm, initial encounter: Secondary | ICD-10-CM | POA: Diagnosis not present

## 2023-09-05 NOTE — ED Notes (Signed)
Patient is being discharged from the Urgent Care and sent to the Emergency Department via POV. Per Trevor Iha, FNP, patient is in need of higher level of care due to fall & laceration/multiple injuries. Patient is aware and verbalizes understanding of plan of care.  Vitals:   09/05/23 1742  BP: (!) 168/95  Pulse: 76  Resp: 18  Temp: 98 F (36.7 C)  SpO2: 91%

## 2023-09-05 NOTE — Discharge Instructions (Signed)
Advised patient to go to [facility] ED now for further evaluation to include imaging of head to rule out subdural hematoma, facial fracture or other.

## 2023-09-05 NOTE — ED Provider Notes (Signed)
[facility]    CSN: [id number] Arrival date & time: 09/05/23  1720      History   Chief Complaint Chief Complaint  Patient presents with   Head Injury   RT arm pain   Laceration    HPI Kayla Gallagher is a 73 y.o. female.   HPI 72 year old female presents with head injury, right eye pain, bilateral arm pain, with left forearm laceration secondary to walking her dog.  Patient reports dog pulled her into a tree with her face hitting the trees and causing swelling to right eye.  PMH significant for HTN, uncontrolled T2DM without complication, and headache.  Past Medical History:  Diagnosis Date   Depression    Diabetes mellitus without complication (HCC)    Essential hypertension, benign 01/07/2013   Headache(784.0)    History of anemia    History of hepatitis C 01/07/2013   Hyperlipidemia    Hypertension    Does not see cardiology    Patient Active Problem List   Diagnosis Date Noted   Recurrent major depressive disorder, in full remission (HCC) 06/05/2023   Dyslipidemia, goal LDL below 70 06/05/2023   Microalbuminuria 08/17/2022   Elevated TSH 06/20/2022   Uncontrolled type 2 diabetes mellitus with hyperglycemia (HCC) 10/26/2021   Grief reaction 11/10/2020   Contusion of rib on left side 08/09/2020   Panic attacks 08/09/2020   Metatarsal stress fracture of right foot 01/26/2020   Elevated serum creatinine 01/29/2018   Menopausal symptoms 01/29/2018   DDD (degenerative disc disease), cervical 06/22/2017   Acute abdominal pain in left lower quadrant 06/22/2017   Obesity 09/09/2015   Anxiety state 05/04/2015   Temporal arteritis (HCC) 06/18/2013   Actinic keratosis 04/16/2013   Type 2 diabetes mellitus (HCC) 02/21/2013   Breast mass, right 02/21/2013   Hyperplastic colon polyp 01/22/2013   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   History of hepatitis C 01/07/2013   Essential hypertension, benign 01/07/2013   Mixed hyperlipidemia 01/07/2013   Depression 01/07/2013     Past Surgical History:  Procedure Laterality Date   ABDOMINAL HYSTERECTOMY  1976   ABDOMINAL HYSTERECTOMY     ARTERY BIOPSY Right 06/30/2013   Procedure: BIOPSY TEMPORAL ARTERY;  Surgeon: Larina Earthly, MD;  Location[facility] OR;  Service: Vascular;  Laterality: Right;   BREAST BIOPSY Right    BREAST REDUCTION SURGERY     BUNIONECTOMY     REDUCTION MAMMAPLASTY     tummy tuck      OB History   No obstetric history on file.      Home Medications    Prior to Admission medications   Medication Sig Start Date End Date Taking? Authorizing Provider  ALPRAZolam Prudy Feeler) 0.5 MG tablet Take 1 tablet (0.5 mg total) by mouth at bedtime as needed for anxiety. 05/17/22  Yes Breeback, Jade L, PA-C  amLODipine (NORVASC) 10 MG tablet TAKE 1 TABLET BY MOUTH EVERY DAY 02/26/23  Yes Breeback, Jade L, PA-C  atorvastatin (LIPITOR) 20 MG tablet Take 1 tablet (20 mg total) by mouth at bedtime. 06/26/23  Yes Breeback, Jade L, PA-C  Biotin 10 MG TABS Take by mouth.   Yes [provider]  estrogens-methylTEST (ESTRATEST) 1.25-2.5 MG tablet Take 1 tablet by mouth daily. 09/03/23  Yes Breeback, Jade L, PA-C  levothyroxine (SYNTHROID) 25 MCG tablet Take 1 tablet (25 mcg total) by mouth daily. 06/27/23  Yes Breeback, Jade L, PA-C  metFORMIN (GLUCOPHAGE) 1000 MG tablet Take 1 tablet (1,000 mg total) by mouth 2 (  two) times daily with a meal. 06/05/23  Yes Breeback, Jade L, PA-C  metoprolol succinate (TOPROL-XL) 25 MG 24 hr tablet Take 1 tablet (25 mg total) by mouth daily. 02/26/23  Yes Breeback, Jade L, PA-C  Multiple Vitamins-Minerals (MULTIVITAMIN PO) Take 1 tablet by mouth daily.   Yes [provider]  PARoxetine (PAXIL) 40 MG tablet TAKE 1 TABLET BY MOUTH EVERY DAY IN THE MORNING 04/20/23  Yes Breeback, Jade L, PA-C  Continuous Glucose Sensor (FREESTYLE LIBRE 14 DAY SENSOR) MISC 1 application  by Does not apply route every 14 (fourteen) days. Apply upper deltoid every 14 days, use reader to determine  blood sugars 06/05/23   Breeback, Jade L, PA-C  meloxicam (MOBIC) 15 MG tablet One tab PO every 24 hours with a meal for 2 weeks, then once every 24 hours prn pain. 06/28/23   Monica Becton, MD  pioglitazone (ACTOS) 30 MG tablet Take 1 tablet (30 mg total) by mouth daily. 06/05/23   Breeback, Jade L, PA-C  predniSONE (DELTASONE) 50 MG tablet One tab PO daily for 5 days. 06/28/23   Monica Becton, MD  sitaGLIPtin (JANUVIA) 100 MG tablet Take 1 tablet (100 mg total) by mouth daily. 06/05/23   Breeback, Jade L, PA-C  tiZANidine (ZANAFLEX) 4 MG tablet Take 1 tablet (4 mg total) by mouth Nightly for 10 days. Patient taking differently: Take 4 mg by mouth Nightly. Takes it as needed 06/28/23 07/17/23  Monica Becton, MD  valsartan (DIOVAN) 320 MG tablet Take 1 tablet (320 mg total) by mouth daily. 06/19/23   Agapito Games, MD    Family History Family History  Problem Relation Age of Onset   Heart attack Father    Diabetes Father        grandmother   Cancer Father    Heart disease Father    Hyperlipidemia Father    Hypertension Father    Cancer Mother    Alcoholism Other        parents   Prostate cancer Other        grandfather   Hyperlipidemia Other        grandfather   Breast cancer Sister     Social History Social History   Tobacco Use   Smoking status: Never   Smokeless tobacco: Never  Vaping Use   Vaping status: Never Used  Substance Use Topics   Alcohol use: No   Drug use: No     Allergies   Morphine and codeine, Farxiga [dapagliflozin], Hydrochlorothiazide, and Mounjaro [tirzepatide]   Review of Systems Review of Systems  Eyes:  Positive for pain.       Left eye pain and swelling  Musculoskeletal:        Bilateral arm pain  Skin:        Laceration of left forearm  Neurological:        Head injury from face hitting front of tree.  All other systems reviewed and are negative.    Physical Exam Triage Vital Signs ED Triage Vitals  [09/05/23 1738]  Encounter Vitals Group     BP      Systolic BP Percentile      Diastolic BP Percentile      Pulse      Resp      Temp      Temp src      SpO2      Weight      Height      Head Circumference  Peak Flow      Pain Score 3     Pain Loc      Pain Education      Exclude from Growth Chart    No data found.  Updated Vital Signs BP (!) 168/95   Pulse 76   Temp 98 F (36.7 C)   Resp 18   SpO2 91%      Physical Exam Vitals and nursing note reviewed.  Constitutional:      Appearance: Normal appearance. She is obese.  HENT:     Head: Normocephalic and atraumatic.     Comments: Noticeable contusion of the right superior orbit, with moderate soft tissue swelling and ecchymosis noted-please see image below    Mouth/Throat:     Mouth: Mucous membranes are moist.     Pharynx: Oropharynx is clear.  Eyes:     Extraocular Movements: Extraocular movements intact.     Conjunctiva/sclera: Conjunctivae normal.     Pupils: Pupils are equal, round, and reactive to light.  Cardiovascular:     Rate and Rhythm: Normal rate and regular rhythm.     Pulses: Normal pulses.     Heart sounds: Normal heart sounds.  Pulmonary:     Effort: Pulmonary effort is normal.     Breath sounds: Normal breath sounds. No wheezing, rhonchi or rales.  Musculoskeletal:        General: Normal range of motion.     Cervical back: Normal range of motion and neck supple.  Skin:    General: Skin is warm and dry.     Comments: Left forearm (volar aspect) skin avulsion, small linear laceration noted bleeding controlled with direct pressure  Neurological:     General: No focal deficit present.     Mental Status: She is alert and oriented to person, place, and time. Mental status is at baseline.  Psychiatric:        Mood and Affect: Mood normal.        Behavior: Behavior normal.        Thought Content: Thought content normal.      UC Treatments / Results  Labs (all labs ordered are  listed, but only abnormal results are displayed) Labs Reviewed - No data to display  EKG   Radiology No results found.  Procedures Procedures (including critical care time)  Medications Ordered in UC Medications - No data to display  Initial Impression / Assessment and Plan / UC Course  I have reviewed the triage vital signs and the nursing notes.  Pertinent labs & imaging results that were available during my care of the patient were reviewed by me and considered in my medical decision making (see chart for details).     MDM: 1.  Injury of head, initial encounter-advised patient to go to [facility] ED now for further evaluation to include imaging of head to rule out subdural hematoma, facial fracture or other patient agreed and verbalized understanding of these instructions and this plan of care this evening; 2.  Right facial swelling-same as 1. 3.  Injury of left lower arm, initial encounter-same as 1 advised patient to go to [facility] health [facility] ED now for further evaluation.  Patient agreed and verbalized understanding of these instructions and this plan of care this evening. Final Clinical Impressions(s) / UC Diagnoses   Final diagnoses:  Injury of head, initial encounter  Right facial swelling  Injury of left lower arm, initial encounter  Discharge Instructions      Advised patient to go to [facility] ED now for further evaluation to include imaging of head to rule out subdural hematoma, facial fracture or other.     ED Prescriptions   None    PDMP not reviewed this encounter.   Trevor Iha, FNP 09/05/23 [id number]

## 2023-09-05 NOTE — ED Triage Notes (Signed)
Pt reports she was walking her dog approx. One Hr. Ago. Pt's dog pulled her into a tree. Pt's face hit the tree and she presents with Bruising and swelling to RT eye tissue. Pt used her RT hand during the accident trying to stop and has RT thumb and RT arm feels weak.  Pt also has a lac to LT forearm. Bleeding controlled on arrival to room. PT denies any LOC.

## 2023-09-07 ENCOUNTER — Other Ambulatory Visit: Payer: Self-pay | Admitting: Physician Assistant

## 2023-09-07 DIAGNOSIS — E1165 Type 2 diabetes mellitus with hyperglycemia: Secondary | ICD-10-CM

## 2023-09-10 ENCOUNTER — Ambulatory Visit (INDEPENDENT_AMBULATORY_CARE_PROVIDER_SITE_OTHER): Payer: Medicare HMO | Admitting: Physician Assistant

## 2023-09-10 VITALS — BP 136/82 | HR 70 | Ht 68.0 in | Wt 206.2 lb

## 2023-09-10 DIAGNOSIS — E6609 Other obesity due to excess calories: Secondary | ICD-10-CM | POA: Diagnosis not present

## 2023-09-10 DIAGNOSIS — E785 Hyperlipidemia, unspecified: Secondary | ICD-10-CM

## 2023-09-10 DIAGNOSIS — W19XXXA Unspecified fall, initial encounter: Secondary | ICD-10-CM | POA: Diagnosis not present

## 2023-09-10 DIAGNOSIS — R809 Proteinuria, unspecified: Secondary | ICD-10-CM

## 2023-09-10 DIAGNOSIS — E1165 Type 2 diabetes mellitus with hyperglycemia: Secondary | ICD-10-CM | POA: Diagnosis not present

## 2023-09-10 DIAGNOSIS — F410 Panic disorder [episodic paroxysmal anxiety] without agoraphobia: Secondary | ICD-10-CM

## 2023-09-10 DIAGNOSIS — Z6830 Body mass index (BMI) 30.0-30.9, adult: Secondary | ICD-10-CM

## 2023-09-10 DIAGNOSIS — E66811 Obesity, class 1: Secondary | ICD-10-CM

## 2023-09-10 DIAGNOSIS — Z7984 Long term (current) use of oral hypoglycemic drugs: Secondary | ICD-10-CM | POA: Diagnosis not present

## 2023-09-10 DIAGNOSIS — I10 Essential (primary) hypertension: Secondary | ICD-10-CM

## 2023-09-10 LAB — POCT GLYCOSYLATED HEMOGLOBIN (HGB A1C): Hemoglobin A1C: 8.1 % — AB (ref 4.0–5.6)

## 2023-09-10 MED ORDER — PIOGLITAZONE HCL 45 MG PO TABS: 45.0000 mg | ORAL_TABLET | Freq: Every day | ORAL | 0 refills | Status: DC

## 2023-09-10 MED ORDER — ALPRAZOLAM 0.5 MG PO TABS: 0.5000 mg | ORAL_TABLET | Freq: Every evening | ORAL | 0 refills | Status: DC | PRN

## 2023-09-10 MED ORDER — METFORMIN HCL 1000 MG PO TABS: 1000.0000 mg | ORAL_TABLET | Freq: Two times a day (BID) | ORAL | 0 refills | Status: DC

## 2023-09-10 NOTE — Progress Notes (Unsigned)
Established Patient Office Visit  Subjective   Patient ID: Kayla Gallagher, female    DOB: Mar 27, 1951  Age: 72 y.o. 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   Hyperplastic colon polyp 01/22/2013   Type 2 diabetes mellitus (HCC) 02/21/2013   Breast mass, right 02/21/2013   Actinic keratosis 04/16/2013   Temporal arteritis (HCC) 06/18/2013   Anxiety state 05/04/2015   Obesity 09/09/2015   DDD (degenerative disc disease), cervical 06/22/2017   Acute abdominal pain in left lower quadrant 06/22/2017   Elevated serum creatinine 01/29/2018   Menopausal symptoms 01/29/2018   Metatarsal stress fracture of right foot 01/26/2020   Contusion of rib on left side 08/09/2020   Panic attacks 08/09/2020   Grief reaction 11/10/2020   Uncontrolled type 2 diabetes mellitus with hyperglycemia (HCC) 10/26/2021   Elevated TSH 06/20/2022   Microalbuminuria 08/17/2022   Recurrent major depressive disorder, in full remission (HCC) 06/05/2023   Dyslipidemia, goal LDL below 70 06/05/2023   Resolved Ambulatory Problems    Diagnosis Date Noted   History of breast cancer 01/07/2013   Strain of elbow, left 06/01/2014   Community acquired pneumonia of right middle lobe of lung 12/06/2017   Past Medical History:  Diagnosis Date   Diabetes mellitus without complication (HCC)    Headache(784.0)    History of anemia    Hyperlipidemia    Hypertension      ROS   See HPi.             Objective:     BP 136/82   Pulse 70   Ht 5\' 8"  (1.727 m)   Wt 206 lb 4 oz (93.6 kg)   SpO2 95%   BMI 31.36 kg/m  BP Readings from Last 3 Encounters:  09/10/23 136/82  09/05/23 (!) 168/95  06/19/23 135/73   Wt Readings from  Last 3 Encounters:  09/10/23 206 lb 4 oz (93.6 kg)  06/19/23 201 lb 1.9 oz (91.2 kg)  06/05/23 200 lb 12 oz (91.1 kg)     Physical Exam     The 10-year ASCVD risk score (Arnett DK, et al., 2019) is: 30.5%    Assessment & Plan:  [name] KitchenMechel was seen today for diabetes and hypertension.  Diagnoses and all orders for this visit:  Uncontrolled type 2 diabetes mellitus with hyperglycemia (HCC) -     POCT HgB A1C -     metFORMIN (GLUCOPHAGE) 1000 MG tablet; Take 1 tablet (1,000 mg total) by mouth 2 (two) times daily with a meal. -     pioglitazone (ACTOS) 45 MG tablet; Take 1 tablet (45 mg total) by mouth daily.  Panic attacks -     ALPRAZolam (XANAX) 0.5 MG tablet; Take 1 tablet (0.5 mg total) by mouth at bedtime as needed for anxiety.  Essential hypertension, benign  Microalbuminuria  Dyslipidemia, goal LDL below 70  Class 1 obesity due to excess calories with serious comorbidity and body mass index (BMI) of 30.0 to 30.9 in adult   A1C improved but not to goal   Microalbumin ordered today Pt has eye exam scheduled for next month  Pt declined Dexa Scan  Return in about 3 months (around 12/11/2023).  Tandy Gaw, PA-C

## 2023-09-10 NOTE — Patient Instructions (Signed)
Increase actos to 45mf daily Continue metformin

## 2023-09-11 ENCOUNTER — Encounter: Payer: Self-pay | Admitting: Physician Assistant

## 2023-09-18 DIAGNOSIS — H25813 Combined forms of age-related cataract, bilateral: Secondary | ICD-10-CM | POA: Diagnosis not present

## 2023-09-18 DIAGNOSIS — H524 Presbyopia: Secondary | ICD-10-CM | POA: Diagnosis not present

## 2023-09-18 DIAGNOSIS — E119 Type 2 diabetes mellitus without complications: Secondary | ICD-10-CM | POA: Diagnosis not present

## 2023-09-18 DIAGNOSIS — H5203 Hypermetropia, bilateral: Secondary | ICD-10-CM | POA: Diagnosis not present

## 2023-09-18 DIAGNOSIS — H43813 Vitreous degeneration, bilateral: Secondary | ICD-10-CM | POA: Diagnosis not present

## 2023-09-18 DIAGNOSIS — Z135 Encounter for screening for eye and ear disorders: Secondary | ICD-10-CM | POA: Diagnosis not present

## 2023-09-18 LAB — HM DIABETES EYE EXAM

## 2023-09-21 ENCOUNTER — Other Ambulatory Visit: Payer: Self-pay | Admitting: Physician Assistant

## 2023-09-21 ENCOUNTER — Other Ambulatory Visit: Payer: Self-pay | Admitting: Family Medicine

## 2023-09-21 DIAGNOSIS — I10 Essential (primary) hypertension: Secondary | ICD-10-CM

## 2023-09-21 DIAGNOSIS — N951 Menopausal and female climacteric states: Secondary | ICD-10-CM

## 2023-09-23 ENCOUNTER — Other Ambulatory Visit: Payer: Self-pay | Admitting: Sports Medicine

## 2023-09-23 DIAGNOSIS — M5030 Other cervical disc degeneration, unspecified cervical region: Secondary | ICD-10-CM

## 2023-09-24 ENCOUNTER — Other Ambulatory Visit: Payer: Self-pay | Admitting: Physician Assistant

## 2023-09-27 ENCOUNTER — Other Ambulatory Visit: Payer: Self-pay | Admitting: Physician Assistant

## 2023-09-27 DIAGNOSIS — N951 Menopausal and female climacteric states: Secondary | ICD-10-CM

## 2023-10-29 ENCOUNTER — Other Ambulatory Visit: Payer: Self-pay | Admitting: Physician Assistant

## 2023-10-29 DIAGNOSIS — Z1231 Encounter for screening mammogram for malignant neoplasm of breast: Secondary | ICD-10-CM

## 2023-12-03 ENCOUNTER — Ambulatory Visit
Admission: EM | Admit: 2023-12-03 | Discharge: 2023-12-03 | Disposition: A | Discharge status: Home / Self Care | Payer: Medicare HMO

## 2023-12-03 ENCOUNTER — Ambulatory Visit: Payer: Medicare HMO

## 2023-12-03 ENCOUNTER — Other Ambulatory Visit: Payer: Self-pay

## 2023-12-03 DIAGNOSIS — J40 Bronchitis, not specified as acute or chronic: Secondary | ICD-10-CM | POA: Diagnosis not present

## 2023-12-03 DIAGNOSIS — R051 Acute cough: Secondary | ICD-10-CM | POA: Diagnosis not present

## 2023-12-03 DIAGNOSIS — R059 Cough, unspecified: Secondary | ICD-10-CM

## 2023-12-03 DIAGNOSIS — J329 Chronic sinusitis, unspecified: Secondary | ICD-10-CM

## 2023-12-03 DIAGNOSIS — R058 Other specified cough: Secondary | ICD-10-CM | POA: Diagnosis not present

## 2023-12-03 MED ORDER — BENZONATATE 100 MG PO CAPS
100.0000 mg | ORAL_CAPSULE | Freq: Three times a day (TID) | ORAL | 0 refills | Status: DC
Start: 2023-12-03 — End: 2023-12-11

## 2023-12-03 MED ORDER — AMOXICILLIN-POT CLAVULANATE 875-125 MG PO TABS: 1.0000 | ORAL_TABLET | Freq: Two times a day (BID) | ORAL | 0 refills | Status: DC

## 2023-12-03 MED ORDER — ALBUTEROL SULFATE HFA 108 (90 BASE) MCG/ACT IN AERS
2.0000 | INHALATION_SPRAY | Freq: Once | RESPIRATORY_TRACT | Status: AC
  Administered 2023-12-03: 2 via RESPIRATORY_TRACT

## 2023-12-03 MED ORDER — PREDNISONE 20 MG PO TABS: 20.0000 mg | ORAL_TABLET | Freq: Every day | ORAL | 0 refills | Status: AC

## 2023-12-03 NOTE — ED Triage Notes (Signed)
Patient presents to UC for cough x 2 weeks. Treating with Claritin. Hx of bronchitis.

## 2023-12-03 NOTE — Discharge Instructions (Signed)
I did not see any evidence of pneumonia on your x-ray.  If the radiologist sees something I will contact you.  Start Augmentin twice daily for 7 days.  Use prednisone 20 mg for 4 days.  Do not take NSAIDs with this medication including aspirin, ibuprofen/Advil, naproxen/Aleve.  This can raise your blood sugar so would avoid carbohydrates and drink plenty of water.  Use Tessalon for cough.  You can use over-the-counter medications for additional symptom relief.  If your symptoms are not improving within a few days or if anything worsens and you have worsening cough, high fever, shortness of breath, chest pain, nausea, vomiting you need to be seen immediately.

## 2023-12-03 NOTE — ED Provider Notes (Signed)
[facility]    CSN: [id number] Arrival date & time: 12/03/23  1231      History   Chief Complaint Chief Complaint  Patient presents with   Cough    HPI Kayla Gallagher is a 73 y.o. female.   Patient presents today with a 2-week history of URI symptoms including cough, congestion, headache, shortness of breath.  Denies any fever, chest pain, nausea, vomiting, diarrhea.  She does have a history of seasonal allergies and initially started Claritin which provided temporary relief of symptoms but continues to have persistent cough that is worsened prompting evaluation.  She denies any recent antibiotics or steroids.  Does report a history of recurrent bronchitis but has not required treatment in a few years.  She denies history of asthma or smoking.  She does not use any inhalers on a regular basis.  She does have a history of diabetes and her last A1c was 8.1% 09/10/2023.  She denies any known sick contacts.    Past Medical History:  Diagnosis Date   Depression    Diabetes mellitus without complication (HCC)    Essential hypertension, benign 01/07/2013   Headache(784.0)    History of anemia    History of hepatitis C 01/07/2013   Hyperlipidemia    Hypertension    Does not see cardiology    Patient Active Problem List   Diagnosis Date Noted   Recurrent major depressive disorder, in full remission (HCC) 06/05/2023   Dyslipidemia, goal LDL below 70 06/05/2023   Microalbuminuria 08/17/2022   Elevated TSH 06/20/2022   Uncontrolled type 2 diabetes mellitus with hyperglycemia (HCC) 10/26/2021   Grief reaction 11/10/2020   Contusion of rib on left side 08/09/2020   Panic attacks 08/09/2020   Metatarsal stress fracture of right foot 01/26/2020   Elevated serum creatinine 01/29/2018   Menopausal symptoms 01/29/2018   DDD (degenerative disc disease), cervical 06/22/2017   Acute abdominal pain in left lower quadrant 06/22/2017   Obesity 09/09/2015   Anxiety state 05/04/2015    Temporal arteritis (HCC) 06/18/2013   Actinic keratosis 04/16/2013   Type 2 diabetes mellitus (HCC) 02/21/2013   Breast mass, right 02/21/2013   Hyperplastic colon polyp 01/22/2013   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   History of hepatitis C 01/07/2013   Essential hypertension, benign 01/07/2013   Mixed hyperlipidemia 01/07/2013   Depression 01/07/2013    Past Surgical History:  Procedure Laterality Date   ABDOMINAL HYSTERECTOMY  1976   ABDOMINAL HYSTERECTOMY     ARTERY BIOPSY Right 06/30/2013   Procedure: BIOPSY TEMPORAL ARTERY;  Surgeon: Larina Earthly, MD;  Location: [facility] OR;  Service: Vascular;  Laterality: Right;   BREAST BIOPSY Right    BREAST REDUCTION SURGERY     BUNIONECTOMY     REDUCTION MAMMAPLASTY     tummy tuck      OB History   No obstetric history on file.      Home Medications    Prior to Admission medications   Medication Sig Start Date End Date Taking? Authorizing Provider  amoxicillin-clavulanate (AUGMENTIN) 875-125 MG tablet Take 1 tablet by mouth every 12 (twelve) hours. 12/03/23  Yes Hakeem Frazzini K, PA-C  benzonatate (TESSALON) 100 MG capsule Take 1 capsule (100 mg total) by mouth every 8 (eight) hours. 12/03/23  Yes Alesana Magistro K, PA-C  predniSONE (DELTASONE) 20 MG tablet Take 1 tablet (20 mg total) by mouth daily for 4 days. 12/03/23 12/07/23 Yes Georgia Delsignore K, PA-C  valsartan (DIOVAN) 320 MG tablet Take  320 mg by mouth daily. 09/14/23  Yes [provider]  ALPRAZolam Prudy Feeler) 0.5 MG tablet Take 1 tablet (0.5 mg total) by mouth at bedtime as needed for anxiety. 09/10/23   Breeback, Jade L, PA-C  amLODipine (NORVASC) 10 MG tablet TAKE 1 TABLET BY MOUTH EVERY DAY 02/26/23   Breeback, Jade L, PA-C  atorvastatin (LIPITOR) 20 MG tablet Take 1 tablet (20 mg total) by mouth at bedtime. 06/26/23   Breeback, Lonna Cobb, PA-C  Biotin 10 MG TABS Take by mouth.    [provider]  Continuous Glucose Sensor (FREESTYLE LIBRE 14 DAY SENSOR) MISC 1 application  by Does  not apply route every 14 (fourteen) days. Apply upper deltoid every 14 days, use reader to determine blood sugars 06/05/23   Breeback, Jade L, PA-C  estrogens-methylTEST (ESTRATEST) 1.25-2.5 MG tablet TAKE 1 TABLET BY MOUTH EVERY DAY 09/28/23   Breeback, Jade L, PA-C  levothyroxine (SYNTHROID) 25 MCG tablet TAKE 1 TABLET BY MOUTH EVERY DAY 09/24/23   Breeback, Jade L, PA-C  metFORMIN (GLUCOPHAGE) 1000 MG tablet Take 1 tablet (1,000 mg total) by mouth 2 (two) times daily with a meal. 09/10/23   Breeback, Jade L, PA-C  metoprolol succinate (TOPROL-XL) 25 MG 24 hr tablet Take 1 tablet (25 mg total) by mouth daily. 02/26/23   Breeback, Lonna Cobb, PA-C  Multiple Vitamins-Minerals (MULTIVITAMIN PO) Take 1 tablet by mouth daily.    [provider]  PARoxetine (PAXIL) 40 MG tablet TAKE 1 TABLET BY MOUTH EVERY DAY IN THE MORNING 04/20/23   Breeback, Jade L, PA-C  pioglitazone (ACTOS) 45 MG tablet Take 1 tablet (45 mg total) by mouth daily. 09/10/23   Jomarie Longs, PA-C    Family History Family History  Problem Relation Age of Onset   Heart attack Father    Diabetes Father        grandmother   Cancer Father    Heart disease Father    Hyperlipidemia Father    Hypertension Father    Cancer Mother    Alcoholism Other        parents   Prostate cancer Other        grandfather   Hyperlipidemia Other        grandfather   Breast cancer Sister     Social History Social History   Tobacco Use   Smoking status: Never   Smokeless tobacco: Never  Vaping Use   Vaping status: Never Used  Substance Use Topics   Alcohol use: No   Drug use: No     Allergies   Morphine and codeine, Farxiga [dapagliflozin], Hydrochlorothiazide, and Mounjaro [tirzepatide]   Review of Systems Review of Systems  Constitutional:  Positive for activity change. Negative for appetite change, fatigue and fever.  HENT:  Positive for congestion and postnasal drip. Negative for sinus pressure, sneezing and sore  throat.   Respiratory:  Positive for cough and shortness of breath.   Cardiovascular:  Negative for chest pain.  Gastrointestinal:  Negative for abdominal pain, diarrhea, nausea and vomiting.  Neurological:  Positive for headaches. Negative for dizziness and light-headedness.     Physical Exam Triage Vital Signs ED Triage Vitals [12/03/23 1318]  Encounter Vitals Group     BP (!) 140/78     Systolic BP Percentile      Diastolic BP Percentile      Pulse Rate 75     Resp 16     Temp 97.6 F (36.4 C)  Temp Source Oral     SpO2 97 %     Weight      Height      Head Circumference      Peak Flow      Pain Score      Pain Loc      Pain Education      Exclude from Growth Chart    No data found.  Updated Vital Signs BP (!) 140/78 (BP Location: Left Arm)   Pulse 75   Temp 97.6 F (36.4 C) (Oral)   Resp 16   SpO2 97%   Visual Acuity Right Eye Distance:   Left Eye Distance:   Bilateral Distance:    Right Eye Near:   Left Eye Near:    Bilateral Near:     Physical Exam Vitals reviewed.  Constitutional:      General: She is awake. She is not in acute distress.    Appearance: Normal appearance. She is well-developed. She is not ill-appearing.     Comments: Very pleasant female appears stated age in no acute distress sitting comfortably in exam room  HENT:     Head: Normocephalic and atraumatic.     Right Ear: Tympanic membrane, ear canal and external ear normal. Tympanic membrane is not erythematous or bulging.     Left Ear: Tympanic membrane, ear canal and external ear normal. Tympanic membrane is not erythematous or bulging.     Nose:     Right Sinus: No maxillary sinus tenderness or frontal sinus tenderness.     Left Sinus: No maxillary sinus tenderness or frontal sinus tenderness.     Mouth/Throat:     Pharynx: Uvula midline. Postnasal drip present. No oropharyngeal exudate or posterior oropharyngeal erythema.  Cardiovascular:     Rate and Rhythm: Normal rate  and regular rhythm.     Heart sounds: Normal heart sounds, S1 normal and S2 normal. No murmur heard. Pulmonary:     Effort: Pulmonary effort is normal.     Breath sounds: Examination of the right-lower field reveals decreased breath sounds. Examination of the left-lower field reveals decreased breath sounds. Decreased breath sounds present. No wheezing, rhonchi or rales.  Psychiatric:        Behavior: Behavior is cooperative.      UC Treatments / Results  Labs (all labs ordered are listed, but only abnormal results are displayed) Labs Reviewed - No data to display  EKG   Radiology No results found.  Procedures Procedures (including critical care time)  Medications Ordered in UC Medications  albuterol (VENTOLIN HFA) 108 (90 Base) MCG/ACT inhaler 2 puff (2 puffs Inhalation Given by Other 12/03/23 1353)    Initial Impression / Assessment and Plan / UC Course  I have reviewed the triage vital signs and the nursing notes.  Pertinent labs & imaging results that were available during my care of the patient were reviewed by me and considered in my medical decision making (see chart for details).     Patient is well-appearing, afebrile, nontoxic, nontachycardic.  Viral testing was deferred as she has been symptomatic for several weeks and this would not change management.  Chest x-ray was obtained that showed peribronchial thickening without focal consolidation.  We were waiting for radiologist overread at the time of discharge we will contact her if this differs and changes our treatment plan.  Given prolonged and worsening symptoms will cover for sinobronchitis with Augmentin twice daily for 7 days.  No indication for dose adjustment based on  metabolic panel from 06/25/2023 with creatinine of 0.95 and calculated creatinine clearance of 77.8 mL/min.  Will also start prednisone burst of 20 mg for 4 days given her history of diabetes.  We discussed this can raise her blood sugar so she  should avoid carbohydrates drink plenty of fluid while on this medication.  She is also to avoid NSAIDs with prednisone.  She was given Tessalon for cough.  She was given albuterol in clinic with some improvement of symptoms.  She was sent home with this medication to be used every 4-6 hours as needed.  Can use over-the-counter medication for symptom management.  She is to rest and drink plenty of fluid.  If her symptoms not improving within a week or if anything worsens she needs to be seen immediately.  Strict return precautions given.  Work excuse note provided.  Final Clinical Impressions(s) / UC Diagnoses   Final diagnoses:  Acute cough  Sinobronchitis     Discharge Instructions      I did not see any evidence of pneumonia on your x-ray.  If the radiologist sees something I will contact you.  Start Augmentin twice daily for 7 days.  Use prednisone 20 mg for 4 days.  Do not take NSAIDs with this medication including aspirin, ibuprofen/Advil, naproxen/Aleve.  This can raise your blood sugar so would avoid carbohydrates and drink plenty of water.  Use Tessalon for cough.  You can use over-the-counter medications for additional symptom relief.  If your symptoms are not improving within a few days or if anything worsens and you have worsening cough, high fever, shortness of breath, chest pain, nausea, vomiting you need to be seen immediately.     ED Prescriptions     Medication Sig Dispense Auth. Provider   amoxicillin-clavulanate (AUGMENTIN) 875-125 MG tablet Take 1 tablet by mouth every 12 (twelve) hours. 14 tablet Rosan Calbert K, PA-C   predniSONE (DELTASONE) 20 MG tablet Take 1 tablet (20 mg total) by mouth daily for 4 days. 4 tablet Makaylie Dedeaux K, PA-C   benzonatate (TESSALON) 100 MG capsule Take 1 capsule (100 mg total) by mouth every 8 (eight) hours. 21 capsule Jakala Herford K, PA-C      PDMP not reviewed this encounter.   Jeani Hawking, PA-C 12/03/23 1412

## 2023-12-11 ENCOUNTER — Ambulatory Visit (INDEPENDENT_AMBULATORY_CARE_PROVIDER_SITE_OTHER): Payer: Medicare HMO | Admitting: Physician Assistant

## 2023-12-11 VITALS — BP 134/84 | HR 77 | Ht 68.0 in | Wt 206.0 lb

## 2023-12-11 DIAGNOSIS — F3342 Major depressive disorder, recurrent, in full remission: Secondary | ICD-10-CM | POA: Diagnosis not present

## 2023-12-11 DIAGNOSIS — Z7984 Long term (current) use of oral hypoglycemic drugs: Secondary | ICD-10-CM

## 2023-12-11 DIAGNOSIS — R7989 Other specified abnormal findings of blood chemistry: Secondary | ICD-10-CM

## 2023-12-11 DIAGNOSIS — I10 Essential (primary) hypertension: Secondary | ICD-10-CM | POA: Diagnosis not present

## 2023-12-11 DIAGNOSIS — E785 Hyperlipidemia, unspecified: Secondary | ICD-10-CM | POA: Diagnosis not present

## 2023-12-11 DIAGNOSIS — E1165 Type 2 diabetes mellitus with hyperglycemia: Secondary | ICD-10-CM

## 2023-12-11 LAB — POCT GLYCOSYLATED HEMOGLOBIN (HGB A1C): Hemoglobin A1C: 7.9 % — AB (ref 4.0–5.6)

## 2023-12-11 MED ORDER — METOPROLOL SUCCINATE ER 25 MG PO TB24: 25.0000 mg | ORAL_TABLET | Freq: Every day | ORAL | 3 refills | Status: DC

## 2023-12-11 MED ORDER — METFORMIN HCL 1000 MG PO TABS: 1000.0000 mg | ORAL_TABLET | Freq: Two times a day (BID) | ORAL | 0 refills | Status: DC

## 2023-12-11 MED ORDER — AMLODIPINE BESYLATE 10 MG PO TABS: ORAL_TABLET | ORAL | 3 refills | Status: DC

## 2023-12-11 MED ORDER — PIOGLITAZONE HCL 45 MG PO TABS: 45.0000 mg | ORAL_TABLET | Freq: Every day | ORAL | 0 refills | Status: DC

## 2023-12-11 NOTE — Patient Instructions (Signed)

## 2023-12-11 NOTE — Progress Notes (Unsigned)
Established Patient Office Visit  Subjective   Patient ID: Kayla Gallagher, female    DOB: 18-Mar-1951  Age: 73 y.o. MRN: [id number]  Chief Complaint  Patient presents with   Medical Management of Chronic Issues    3 mo fup last A1c was 8.0    HPI  Pt is a 73 yo female with a hx of T2DM, HTN and HLD who presents to the clinic today for a 3 month follow up.  Her last A1c was 8.1. She states that she has not been watching what she eats these last 3 months and she has not been exercising. She has not been checking her blood sugars at home.  Pt also states that she stopped taking her Levothyroxine 5 days ago because it was previously causing her diarrhea and she feels like she "just does not need it anymore".  Was seen in UC on 12/03/2023 for sinobronchitis, was prescribed Augmentin, prednisone, tessalon and an albuterol inhaler. Pt states she is doing much better now  Review of Systems  All other systems reviewed and are negative.  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   Hyperplastic colon polyp 01/22/2013   Type 2 diabetes mellitus (HCC) 02/21/2013   Breast mass, right 02/21/2013   Actinic keratosis 04/16/2013   Temporal arteritis (HCC) 06/18/2013   Anxiety state 05/04/2015   Obesity 09/09/2015   DDD (degenerative disc disease), cervical 06/22/2017   Acute abdominal pain in left lower quadrant 06/22/2017   Elevated serum creatinine 01/29/2018   Menopausal symptoms 01/29/2018   Metatarsal stress fracture of right foot 01/26/2020   Contusion of rib on left side 08/09/2020   Panic attacks 08/09/2020   Grief reaction 11/10/2020   Uncontrolled type 2 diabetes mellitus with hyperglycemia (HCC) 10/26/2021   Elevated TSH 06/20/2022   Microalbuminuria 08/17/2022   Recurrent major depressive disorder, in  full remission (HCC) 06/05/2023   Dyslipidemia, goal LDL below 70 06/05/2023   Resolved Ambulatory Problems    Diagnosis Date Noted   History of breast cancer 01/07/2013   Strain of elbow, left 06/01/2014   Community acquired pneumonia of right middle lobe of lung 12/06/2017   Past Medical History:  Diagnosis Date   Diabetes mellitus without complication (HCC)    Headache(784.0)    History of anemia    Hyperlipidemia    Hypertension       Objective:     BP 134/84   Pulse 77   Ht 5\' 8"  (1.727 m)   Wt 93.4 kg   SpO2 99%   BMI 31.32 kg/m   BP Readings from Last 3 Encounters:  12/11/23 134/84  12/03/23 (!) 140/78  09/10/23 136/82   Wt Readings from Last 3 Encounters:  12/11/23 93.4 kg  09/10/23 93.6 kg  06/19/23 91.2 kg    Physical Exam Constitutional:      Appearance: Normal appearance. She is obese.  HENT:     Head: Normocephalic.  Cardiovascular:     Rate and Rhythm: Normal rate and regular rhythm.     Pulses: Normal pulses.     Heart sounds: Normal heart sounds.  Pulmonary:     Effort: Pulmonary effort is normal.     Breath sounds: Normal breath sounds. No wheezing, rhonchi or rales.  Musculoskeletal:     Right lower leg: No edema.  Left lower leg: No edema.  Skin:    General: Skin is warm and dry.  Neurological:     General: No focal deficit present.     Mental Status: She is alert and oriented to person, place, and time.  Psychiatric:        Mood and Affect: Mood normal.    Results for orders placed or performed in visit on 12/11/23  POCT HgB A1C  Result Value Ref Range   Hemoglobin A1C 7.9 (A) 4.0 - 5.6 %   HbA1c POC (<> result, manual entry)     HbA1c, POC (prediabetic range)     HbA1c, POC (controlled diabetic range)      {Labs (Optional):23779}  The 10-year ASCVD risk score (Arnett DK, et al., 2019) is: 32.4%    Assessment & Plan:   Arielys was seen today for medical management of chronic issues.  Diagnoses and all orders for  this visit:  Uncontrolled type 2 diabetes mellitus with hyperglycemia (HCC) -     POCT HgB A1C -     metFORMIN (GLUCOPHAGE) 1000 MG tablet; Take 1 tablet (1,000 mg total) by mouth 2 (two) times daily with a meal. -     pioglitazone (ACTOS) 45 MG tablet; Take 1 tablet (45 mg total) by mouth daily.  Dyslipidemia, goal LDL below 70  Essential hypertension, benign -     amLODipine (NORVASC) 10 MG tablet; TAKE 1 TABLET BY MOUTH EVERY DAY -     metoprolol succinate (TOPROL-XL) 25 MG 24 hr tablet; Take 1 tablet (25 mg total) by mouth daily.  Recurrent major depressive disorder, in full remission (HCC)   A1c lower but still not to goal  Start Glipizide twice daily with meals  Initial BP not to goal, recheck was better. Remember to take BP medications daily  Eye exam UTD Pt declined all vaccines   Follow up in 3 months (around 4/282/2025)  Windy Fast [place], Student-PA

## 2023-12-12 ENCOUNTER — Encounter: Payer: Self-pay | Admitting: Physician Assistant

## 2023-12-12 MED ORDER — GLIPIZIDE 10 MG PO TABS: 10.0000 mg | ORAL_TABLET | Freq: Two times a day (BID) | ORAL | 0 refills | Status: DC

## 2023-12-19 ENCOUNTER — Other Ambulatory Visit: Payer: Self-pay | Admitting: Family Medicine

## 2024-01-02 ENCOUNTER — Encounter: Payer: Self-pay | Admitting: Physician Assistant

## 2024-01-02 ENCOUNTER — Ambulatory Visit (INDEPENDENT_AMBULATORY_CARE_PROVIDER_SITE_OTHER): Payer: Medicare HMO | Admitting: Physician Assistant

## 2024-01-02 VITALS — BP 128/73 | HR 71 | Ht 68.0 in | Wt 209.8 lb

## 2024-01-02 DIAGNOSIS — L821 Other seborrheic keratosis: Secondary | ICD-10-CM | POA: Insufficient documentation

## 2024-01-02 DIAGNOSIS — L820 Inflamed seborrheic keratosis: Secondary | ICD-10-CM

## 2024-01-02 NOTE — Patient Instructions (Signed)
 Seborrheic Keratosis A seborrheic keratosis is a common, noncancerous (benign) skin growth. These growths are velvety, waxy, or rough spots that appear on the skin. They are often tan, brown, or black. The skin growths can be flat or raised and may be scaly. What are the causes? The cause of this condition is not known. What increases the risk? You are more likely to develop this condition if you: Have a family history of seborrheic keratosis. Are 74 years old or older. Are pregnant. Have had estrogen replacement therapy. What are the signs or symptoms? Symptoms of this condition include growths on the face, chest, shoulders, back, or other areas. These growths: Are usually painless, but may become irritated and itchy. Can be tan, yellow, brown, black, or other colors. Are slightly raised or have a flat surface. Are sometimes rough or wart-like in texture. Are often velvety or waxy on the surface. Are round or oval-shaped. Often occur in groups, but may occur as a single growth. How is this diagnosed? This condition is diagnosed with a medical history and physical exam. A sample of the growth may be tested (skin biopsy). You may also need to see a skin specialist (dermatologist). How is this treated? Treatment is not usually needed for this condition unless the growths are irritated or bleed often. You may also choose to have the growths removed if you do not like their appearance. Growth removal may include a procedure in which: Liquid nitrogen is applied to "freeze" off the growth (cryosurgery). This is the most common procedure. The growth is burned off with electricity (electrocautery). The growth is removed by scraping (curettage). Follow these instructions at home: Watch your growth or growths for any changes. Do not scratch or pick at the growth or growths. This can cause them to become irritated or infected. Contact a health care provider if: You suddenly have many new  growths. Your growth bleeds, itches, or hurts. Your growth suddenly becomes larger or changes color. Summary A seborrheic keratosis is a common, noncancerous skin growth. Treatment is not usually needed for this condition unless the growths are irritated or bleed often. Watch your growth or growths for any changes. Contact a health care provider if you suddenly have many new growths or your growth suddenly becomes larger or changes color. This information is not intended to replace advice given to you by your health care provider. Make sure you discuss any questions you have with your health care provider. Document Revised: 01/13/2022 Document Reviewed: 01/13/2022 Elsevier Patient Education  2024 [employer].

## 2024-01-02 NOTE — Progress Notes (Signed)
Acute Office Visit  Subjective:     Patient ID: Kayla Gallagher, female    DOB: 1951/01/30, 73 y.o.   MRN: [id number]  CC: black spots on back  HPI  Patient is a 73 yo female who presents with a chief complaint of black spots on her back. They do not itch, burn, or bother the patient. She states her granddaughter found them and she does not know how long they have been there. She does have a history of sun exposure without the use of sun protection.   .. 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   Hyperplastic colon polyp 01/22/2013   Type 2 diabetes mellitus (HCC) 02/21/2013   Breast mass, right 02/21/2013   Actinic keratosis 04/16/2013   Temporal arteritis (HCC) 06/18/2013   Anxiety state 05/04/2015   Obesity 09/09/2015   DDD (degenerative disc disease), cervical 06/22/2017   Acute abdominal pain in left lower quadrant 06/22/2017   Elevated serum creatinine 01/29/2018   Menopausal symptoms 01/29/2018   Metatarsal stress fracture of right foot 01/26/2020   Contusion of rib on left side 08/09/2020   Panic attacks 08/09/2020   Grief reaction 11/10/2020   Uncontrolled type 2 diabetes mellitus with hyperglycemia (HCC) 10/26/2021   Elevated TSH 06/20/2022   Microalbuminuria 08/17/2022   Recurrent major depressive disorder, in full remission (HCC) 06/05/2023   Dyslipidemia, goal LDL below 70 06/05/2023   Seborrheic keratosis 01/02/2024   Resolved Ambulatory Problems    Diagnosis Date Noted   History of breast cancer 01/07/2013   Strain of elbow, left 06/01/2014   Community acquired pneumonia of right middle lobe of lung 12/06/2017   Past Medical History:  Diagnosis Date   Diabetes mellitus without complication (HCC)    Headache(784.0)    History of anemia    Hyperlipidemia    Hypertension      Review of Systems  Skin:        3 black spots between shoulder blades on upper back   All other systems reviewed and are negative.     Objective:    BP 128/73 (BP Location: Left Arm, Patient Position: Sitting, Cuff Size: Large)   Pulse 71   Ht 5\' 8"  (1.727 m)   Wt 209 lb 12 oz (95.1 kg)   SpO2 94%   BMI 31.89 kg/m  BP Readings from Last 3 Encounters:  01/02/24 128/73  12/11/23 134/84  12/03/23 (!) 140/78   Wt Readings from Last 3 Encounters:  01/02/24 209 lb 12 oz (95.1 kg)  12/11/23 206 lb (93.4 kg)  09/10/23 206 lb 4 oz (93.6 kg)    Physical Exam Constitutional:      Appearance: Normal appearance.  HENT:     Head: Normocephalic and atraumatic.  Cardiovascular:     Rate and Rhythm: Normal rate and regular rhythm.  Skin:         Comments: 3 raised, irregular, rough black and brown lesions on the upper back between the scapula.    Neurological:     Mental Status: She is alert.   Cryotherapy Procedure Note  Pre-operative Diagnosis: inflamed SK's (3)  Post-operative Diagnosis: same  Locations: mid back  Indications: inflamed   Procedure Details  History of allergy to iodine: no. Pacemaker? no.  Patient informed of risks (permanent scarring, infection, light or dark discoloration, bleeding,  infection, weakness, numbness and recurrence of the lesion) and benefits of the procedure and verbal informed consent obtained.  The areas are treated with liquid nitrogen therapy, frozen until ice ball extended 2 mm beyond lesion, allowed to thaw, and treated again. The patient tolerated procedure well.  The patient was instructed on post-op care, warned that there may be blister formation, redness and pain. Recommend OTC analgesia as needed for pain.  Condition: Stable  Complications: none.  Plan: 1. Instructed to keep the area dry and covered for 24-48h and clean thereafter. 2. Warning signs of infection were reviewed.   3. Recommended that the patient use OTC  acetaminophen as needed for pain.      Assessment & Plan:  [name] KitchenDiagnoses and all orders for this visit:  Inflamed seborrheic keratosis   - Crytotherapy performed in office today on all 3 SKs -HO given  -discussed progression of removal -follow up as needed  Tandy Gaw, PA-C

## 2024-01-06 ENCOUNTER — Other Ambulatory Visit: Payer: Self-pay | Admitting: Physician Assistant

## 2024-01-06 DIAGNOSIS — E1165 Type 2 diabetes mellitus with hyperglycemia: Secondary | ICD-10-CM

## 2024-02-21 ENCOUNTER — Other Ambulatory Visit: Payer: Self-pay | Admitting: Physician Assistant

## 2024-02-21 DIAGNOSIS — F410 Panic disorder [episodic paroxysmal anxiety] without agoraphobia: Secondary | ICD-10-CM

## 2024-03-09 ENCOUNTER — Other Ambulatory Visit: Payer: Self-pay | Admitting: Physician Assistant

## 2024-03-09 DIAGNOSIS — E1165 Type 2 diabetes mellitus with hyperglycemia: Secondary | ICD-10-CM

## 2024-03-10 ENCOUNTER — Encounter: Payer: Self-pay | Admitting: Physician Assistant

## 2024-03-10 ENCOUNTER — Ambulatory Visit (INDEPENDENT_AMBULATORY_CARE_PROVIDER_SITE_OTHER): Payer: Medicare HMO | Admitting: Physician Assistant

## 2024-03-10 VITALS — BP 130/70 | HR 82 | Ht 68.0 in | Wt 210.0 lb

## 2024-03-10 DIAGNOSIS — F3342 Major depressive disorder, recurrent, in full remission: Secondary | ICD-10-CM | POA: Diagnosis not present

## 2024-03-10 DIAGNOSIS — I10 Essential (primary) hypertension: Secondary | ICD-10-CM | POA: Diagnosis not present

## 2024-03-10 DIAGNOSIS — E1165 Type 2 diabetes mellitus with hyperglycemia: Secondary | ICD-10-CM

## 2024-03-10 DIAGNOSIS — Z7984 Long term (current) use of oral hypoglycemic drugs: Secondary | ICD-10-CM | POA: Diagnosis not present

## 2024-03-10 DIAGNOSIS — E785 Hyperlipidemia, unspecified: Secondary | ICD-10-CM | POA: Diagnosis not present

## 2024-03-10 LAB — POCT GLYCOSYLATED HEMOGLOBIN (HGB A1C): Hemoglobin A1C: 8 % — AB (ref 4.0–5.6)

## 2024-03-10 MED ORDER — MOUNJARO 2.5 MG/0.5ML ~~LOC~~ SOAJ: 2.5000 mg | SUBCUTANEOUS | 0 refills | Status: DC

## 2024-03-10 NOTE — Patient Instructions (Signed)
Restart mounjaro

## 2024-03-10 NOTE — Progress Notes (Unsigned)
 Established Patient Office Visit  Subjective   Patient ID: Kayla Gallagher, female    DOB: 01/18/1951  Age: 73 y.o. MRN: [id number]  Chief Complaint  Patient presents with   Medical Management of Chronic Issues    Uncontrolled type 2 diabetes mellitus with hyperglycemia  last A1c 7.9    HPI Pt is a 73 yo obese female with T2DM, HTN, HLD, MDD who presents to the clinic to follow up and get medication refills.   Pt is not checking her sugars. She has tried to make some diet changes to help with glucose. She was not able to tolerate glipizide  due to making her feel "funny". She is taking actos  and metformin . She is tolerating them well. She denies any CP, palpitations, headaches. She is active but not exercising.   .. 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   Hyperplastic colon polyp 01/22/2013   Type 2 diabetes mellitus (HCC) 02/21/2013   Breast mass, right 02/21/2013   Actinic keratosis 04/16/2013   Temporal arteritis (HCC) 06/18/2013   Anxiety state 05/04/2015   Obesity 09/09/2015   DDD (degenerative disc disease), cervical 06/22/2017   Acute abdominal pain in left lower quadrant 06/22/2017   Elevated serum creatinine 01/29/2018   Menopausal symptoms 01/29/2018   Metatarsal stress fracture of right foot 01/26/2020   Contusion of rib on left side 08/09/2020   Panic attacks 08/09/2020   Grief reaction 11/10/2020   Uncontrolled type 2 diabetes mellitus with hyperglycemia (HCC) 10/26/2021   Elevated TSH 06/20/2022   Microalbuminuria 08/17/2022   Recurrent major depressive disorder, in full remission (HCC) 06/05/2023   Dyslipidemia, goal LDL below 70 06/05/2023   Seborrheic keratosis 01/02/2024   Resolved Ambulatory Problems    Diagnosis Date Noted   History of breast cancer 01/07/2013   Strain of  elbow, left 06/01/2014   Community acquired pneumonia of right middle lobe of lung 12/06/2017   Past Medical History:  Diagnosis Date   Diabetes mellitus without complication (HCC)    Headache(784.0)    History of anemia    Hyperlipidemia    Hypertension        ROS See HPI.    Objective:     BP 130/70 (Patient Position: Sitting, Cuff Size: Normal)   Pulse 82   Ht 5\' 8"  (1.727 m)   Wt 210 lb (95.3 kg)   SpO2 99%   BMI 31.93 kg/m  BP Readings from Last 3 Encounters:  03/10/24 130/70  01/02/24 128/73  12/11/23 134/84   Wt Readings from Last 3 Encounters:  03/10/24 210 lb (95.3 kg)  01/02/24 209 lb 12 oz (95.1 kg)  12/11/23 206 lb (93.4 kg)   ..    03/10/2024    3:26 PM 03/10/2024    1:35 PM 12/11/2023    1:52 PM 07/17/2023    1:59 PM 06/05/2023   11:06 AM  Depression screen PHQ 2/9  Decreased Interest 1 0 0 0 0  Down, Depressed, Hopeless 1 0 0 0 0  PHQ - 2 Score 2 0 0 0 0  Altered sleeping 1      Tired, decreased energy 1      Change in appetite 1      Feeling bad or failure about yourself  1      Trouble concentrating 1  Moving slowly or fidgety/restless 1      Suicidal thoughts 1      PHQ-9 Score 9      Difficult doing work/chores Somewhat difficult         Physical Exam Constitutional:      Appearance: Normal appearance.  HENT:     Head: Normocephalic.  Cardiovascular:     Rate and Rhythm: Normal rate and regular rhythm.     Heart sounds: Normal heart sounds.  Pulmonary:     Effort: Pulmonary effort is normal.     Breath sounds: Normal breath sounds.  Musculoskeletal:     Cervical back: Normal range of motion.     Right lower leg: No edema.     Left lower leg: No edema.  Neurological:     General: No focal deficit present.     Mental Status: She is alert and oriented to person, place, and time.  Psychiatric:        Mood and Affect: Mood normal.      Results for orders placed or performed in visit on 03/10/24  POCT HgB A1C  Result  Value Ref Range   Hemoglobin A1C 8.0 (A) 4.0 - 5.6 %   HbA1c POC (<> result, manual entry)     HbA1c, POC (prediabetic range)     HbA1c, POC (controlled diabetic range)       The 10-year ASCVD risk score (Arnett DK, et al., 2019) is: 30.8%    Assessment & Plan:  [name] was seen today for medical management of chronic issues.  Diagnoses and all orders for this visit:  Uncontrolled type 2 diabetes mellitus with hyperglycemia (HCC) -     POCT HgB A1C -     tirzepatide  (MOUNJARO ) 2.5 MG/0.5ML Pen; Inject 2.5 mg into the skin once a week.  Dyslipidemia, goal LDL below 70  Essential hypertension, benign  Recurrent major depressive disorder, in full remission (HCC)   A1C not to goal Pt did not tolerate glipizide  Continue actos  and metformin  Discussed other medication options and patient would like to try mounjaro  again for A1C control Sent 2.5mg  dose Discussed SE and titration but will stay at this dose for 3 months BP close to goal on ARB .Aaron Aas Diabetic Foot Exam - Simple   Simple Foot Form Diabetic Foot exam was performed with the following findings: Yes 03/10/2024  1:35 PM  Visual Inspection No deformities, no ulcerations, no other skin breakdown bilaterally: Yes Sensation Testing Intact to touch and monofilament testing bilaterally: Yes Pulse Check Posterior Tibialis and Dorsalis pulse intact bilaterally: Yes Comments    No concerns Eye exam up to date.  Follow up in 3 months.       Return in about 3 months (around 06/09/2024).    Omarius Grantham, PA-C

## 2024-03-15 ENCOUNTER — Other Ambulatory Visit: Payer: Self-pay | Admitting: Physician Assistant

## 2024-03-15 DIAGNOSIS — F32A Depression, unspecified: Secondary | ICD-10-CM

## 2024-04-23 ENCOUNTER — Other Ambulatory Visit: Payer: Self-pay | Admitting: Physician Assistant

## 2024-04-23 DIAGNOSIS — N951 Menopausal and female climacteric states: Secondary | ICD-10-CM

## 2024-05-17 ENCOUNTER — Other Ambulatory Visit: Payer: Self-pay | Admitting: Physician Assistant

## 2024-05-17 DIAGNOSIS — W540XXA Bitten by dog, initial encounter: Secondary | ICD-10-CM | POA: Diagnosis not present

## 2024-05-17 DIAGNOSIS — S61451A Open bite of right hand, initial encounter: Secondary | ICD-10-CM | POA: Diagnosis not present

## 2024-05-17 DIAGNOSIS — Z23 Encounter for immunization: Secondary | ICD-10-CM | POA: Diagnosis not present

## 2024-05-17 DIAGNOSIS — E1165 Type 2 diabetes mellitus with hyperglycemia: Secondary | ICD-10-CM

## 2024-05-18 DIAGNOSIS — Z5189 Encounter for other specified aftercare: Secondary | ICD-10-CM | POA: Diagnosis not present

## 2024-05-18 DIAGNOSIS — Z87891 Personal history of nicotine dependence: Secondary | ICD-10-CM | POA: Diagnosis not present

## 2024-05-18 DIAGNOSIS — Z4801 Encounter for change or removal of surgical wound dressing: Secondary | ICD-10-CM | POA: Diagnosis not present

## 2024-05-18 DIAGNOSIS — S61411A Laceration without foreign body of right hand, initial encounter: Secondary | ICD-10-CM | POA: Diagnosis not present

## 2024-05-18 DIAGNOSIS — Z885 Allergy status to narcotic agent status: Secondary | ICD-10-CM | POA: Diagnosis not present

## 2024-05-30 ENCOUNTER — Encounter: Payer: Self-pay | Admitting: Physician Assistant

## 2024-05-30 ENCOUNTER — Ambulatory Visit (INDEPENDENT_AMBULATORY_CARE_PROVIDER_SITE_OTHER): Admitting: Physician Assistant

## 2024-05-30 VITALS — BP 135/84 | HR 87 | Ht 68.0 in | Wt 210.0 lb

## 2024-05-30 DIAGNOSIS — S61451D Open bite of right hand, subsequent encounter: Secondary | ICD-10-CM | POA: Diagnosis not present

## 2024-05-30 DIAGNOSIS — S61451A Open bite of right hand, initial encounter: Secondary | ICD-10-CM | POA: Insufficient documentation

## 2024-05-30 DIAGNOSIS — W540XXD Bitten by dog, subsequent encounter: Secondary | ICD-10-CM | POA: Diagnosis not present

## 2024-05-30 DIAGNOSIS — Z4802 Encounter for removal of sutures: Secondary | ICD-10-CM | POA: Diagnosis not present

## 2024-05-30 NOTE — Patient Instructions (Signed)
Suture Removal, Care After The following information offers guidance on how to care for yourself after your procedure. Your health care provider may also give you more specific instructions. If you have problems or questions, contact your health care provider. What can I expect after the procedure? After your stitches (sutures) are removed, it is common to have: Some discomfort and swelling in the area. Slight redness in the area. Follow these instructions at home: If you have a dressing: Wash your hands with soap and water for at least 20 seconds before and after you change your bandage (dressing). If soap and water are not available, use hand sanitizer. Change your dressing as told by your health care provider. If your dressing becomes wet or dirty, or develops a bad smell, change it as soon as possible. If your dressing sticks to your skin, pour warm, clean water over it until it loosens and can be removed without pulling apart the wound edges. Pat the area dry with a soft, clean towel. Do not rub the wound because that may cause bleeding. Wound care  Check your wound every day for signs of infection. Check for: More redness, swelling, or pain. Fluid or blood. New warmth, a rash, or hardness at the wound site. Pus or a bad smell. Wash your hands with soap and water for at least 20 seconds before and after touching your wound. If soap and water are not available, use hand sanitizer. Keep the wound area dry and clean. Clean and pat the wound dry as told by your health care provider. Apply cream or ointment only as told by your health care provider. If skin glue or adhesive strips were applied after sutures were removed, leave these closures in place. They may need to stay in place for 2 weeks or longer. If adhesive strip edges start to loosen and curl up, you may trim the loose edges. Do not remove adhesive strips completely unless your health care provider tells you to do that. Continue to  protect the wound from injury. Do not pick at your wound. Picking can cause an infection. Bathing Do not take baths, swim, or use a hot tub until your health care provider approves. Ask your health care provider if you may take showers. Follow these steps for showering: If you have a dressing, remove it before getting into the shower. In the shower, allow soapy water to get on the wound. Avoid scrubbing the wound. When you get out of the shower, dry the wound by patting it with a clean towel. Reapply a dressing over the wound, if needed. Scar care When your wound has completely healed, help decrease the size of your scar by: Wearing sunscreen over the scar or covering it with clothing when you are outside. New scars get sunburned easily, which can make scarring worse. Gently massaging the scarred area. This can decrease scar thickness. General instructions Take over-the-counter and prescription medicines only as told by your health care provider. Keep all follow-up visits. This is important. Contact a health care provider if: You have more redness, swelling, or pain around your wound. You have fluid or blood coming from your wound. You have new warmth, a rash, or hardness at the wound site. You have pus or a bad smell coming from your wound. Your wound opens up. Get help right away if: You have a fever or chills. You have red streaks coming from your wound. Summary After your sutures are removed, it is common to have some discomfort  and swelling in the area. Wash your hands with soap and water before you change your bandage (dressing). Keep the wound area dry and clean. Do not take baths, swim, or use a hot tub until your health care provider approves. This information is not intended to replace advice given to you by your health care provider. Make sure you discuss any questions you have with your health care provider. Document Revised: 02/22/2021 Document Reviewed:  02/22/2021 Elsevier Patient Education  2024 [employer].

## 2024-05-30 NOTE — Progress Notes (Signed)
   Acute Office Visit  Subjective:     Patient ID: Kayla Gallagher, female    DOB: 13-Oct-1951, 73 y.o.   MRN: [id number]  Chief Complaint  Patient presents with   Suture / Staple Removal    Right hand  suture removal     Patient is in today for suture removal. Patient had 5 sutures in her right hand from a dog bite she sustained on 7/5. Patient received Augmentin  and Tdap immunization in hospital following injury and laceration repair. Patient had been applying neosporin ointment the wounds and denies any bleeding, oozing, or drainage from the site.   Review of Systems  All other systems reviewed and are negative.       Objective:    BP 135/84   Pulse 87   Ht 5' 8 (1.727 m)   Wt 210 lb (95.3 kg)   SpO2 98%   BMI 31.93 kg/m     Physical Exam Vitals reviewed.  Musculoskeletal:     Comments: Right hand: multiple lacerations with simple interrupted sutures to middle and index finger in healing process with minor erythema. No active drainage. No tenderness to palpation.   Skin:    General: Skin is warm and dry.  Neurological:     Mental Status: She is alert.          Assessment & Plan:  [name] was seen today for suture / staple removal.  Diagnoses and all orders for this visit:  Visit for suture removal  Dog bite of right hand, subsequent encounter    - 5 simple interrupted sutures on the right hand were removed; Multiple small lacerations of the right index and middle finger. Erythema present around healing wounds . - Antibiotic ointment applied to wounds after sutures were removed  - Patient tolerated procedure well. No complications    Vermell Bologna, PA-C

## 2024-06-05 ENCOUNTER — Other Ambulatory Visit: Payer: Self-pay | Admitting: Physician Assistant

## 2024-06-05 DIAGNOSIS — E1165 Type 2 diabetes mellitus with hyperglycemia: Secondary | ICD-10-CM

## 2024-06-09 ENCOUNTER — Ambulatory Visit
Admission: EM | Admit: 2024-06-09 | Discharge: 2024-06-09 | Disposition: A | Discharge status: Home / Self Care | Attending: Family Medicine | Admitting: Family Medicine

## 2024-06-09 ENCOUNTER — Other Ambulatory Visit: Payer: Self-pay

## 2024-06-09 DIAGNOSIS — L0201 Cutaneous abscess of face: Secondary | ICD-10-CM

## 2024-06-09 MED ORDER — DOXYCYCLINE HYCLATE 100 MG PO CAPS: 100.0000 mg | ORAL_CAPSULE | Freq: Two times a day (BID) | ORAL | 0 refills | Status: DC

## 2024-06-09 NOTE — ED Provider Notes (Signed)
 [facility]    CSN: [id number] Arrival date & time: 06/09/24  1353      History   Chief Complaint Chief Complaint  Patient presents with   Abscess    HPI Kayla Gallagher is a 73 y.o. female.   HPI  Patient has an abscess on her right cheek.  She states she has had infections in this place before.  Thinks she might have a cyst.  States it is very painful.  Been going for 4 days.  No fever chills or malaise.  She is a diabetic.  Usual hemoglobin A1c around 8  Past Medical History:  Diagnosis Date   Depression    Diabetes mellitus without complication (HCC)    Essential hypertension, benign 01/07/2013   Headache(784.0)    History of anemia    History of hepatitis C 01/07/2013   Hyperlipidemia    Hypertension    Does not see cardiology    Patient Active Problem List   Diagnosis Date Noted   Dog bite of right hand 05/30/2024   Seborrheic keratosis 01/02/2024   Recurrent major depressive disorder, in full remission (HCC) 06/05/2023   Dyslipidemia, goal LDL below 70 06/05/2023   Microalbuminuria 08/17/2022   Elevated TSH 06/20/2022   Uncontrolled type 2 diabetes mellitus with hyperglycemia (HCC) 10/26/2021   Grief reaction 11/10/2020   Panic attacks 08/09/2020   Metatarsal stress fracture of right foot 01/26/2020   Elevated serum creatinine 01/29/2018   DDD (degenerative disc disease), cervical 06/22/2017   Obesity 09/09/2015   Anxiety state 05/04/2015   Actinic keratosis 04/16/2013   Breast mass, right 02/21/2013   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   History of hepatitis C 01/07/2013   Essential hypertension, benign 01/07/2013   Mixed hyperlipidemia 01/07/2013   Depression 01/07/2013    Past Surgical History:  Procedure Laterality Date   ABDOMINAL HYSTERECTOMY  1976   ABDOMINAL HYSTERECTOMY     ARTERY BIOPSY Right 06/30/2013   Procedure: BIOPSY TEMPORAL ARTERY;  Surgeon: Krystal JULIANNA Doing, MD;  Location: [facility] OR;  Service: Vascular;  Laterality: Right;   BREAST  BIOPSY Right    BREAST REDUCTION SURGERY     BUNIONECTOMY     REDUCTION MAMMAPLASTY     tummy tuck      OB History   No obstetric history on file.      Home Medications    Prior to Admission medications   Medication Sig Start Date End Date Taking? Authorizing Provider  doxycycline  (VIBRAMYCIN ) 100 MG capsule Take 1 capsule (100 mg total) by mouth 2 (two) times daily. 06/09/24  Yes Maranda Jamee Jacob, MD  ALPRAZolam  (XANAX ) 0.5 MG tablet TAKE 1 TABLET BY MOUTH AT BEDTIME AS NEEDED FOR ANXIETY. 02/25/24   Breeback, Jade L, PA-C  amLODipine  (NORVASC ) 10 MG tablet TAKE 1 TABLET BY MOUTH EVERY DAY 12/11/23   Breeback, Jade L, PA-C  atorvastatin  (LIPITOR) 20 MG tablet Take 1 tablet (20 mg total) by mouth at bedtime. 06/26/23   Breeback, Jade L, PA-C  Biotin 10 MG TABS Take by mouth.    [provider]  estrogens -methylTEST (ESTRATEST) 1.25-2.5 MG tablet TAKE 1 TABLET BY MOUTH EVERY DAY 04/25/24   Breeback, Jade L, PA-C  metFORMIN  (GLUCOPHAGE ) 1000 MG tablet TAKE 1 TABLET (1,000 MG TOTAL) BY MOUTH TWICE A DAY WITH FOOD 05/21/24   Breeback, Jade L, PA-C  metoprolol  succinate (TOPROL -XL) 25 MG 24 hr tablet Take 1 tablet (25 mg total) by mouth daily. 12/11/23   Breeback, Jade L, PA-C  Multiple Vitamins-Minerals (MULTIVITAMIN PO) Take 1 tablet by mouth daily.    [provider]  PARoxetine  (PAXIL ) 40 MG tablet TAKE 1 TABLET BY MOUTH EVERY DAY IN THE MORNING 03/18/24   Breeback, Jade L, PA-C  pioglitazone  (ACTOS ) 45 MG tablet TAKE 1 TABLET BY MOUTH EVERY DAY 06/06/24   Breeback, Jade L, PA-C  tirzepatide  (MOUNJARO ) 2.5 MG/0.5ML Pen Inject 2.5 mg into the skin once a week. 03/10/24   Breeback, Jade L, PA-C  valsartan  (DIOVAN ) 320 MG tablet TAKE 1 TABLET BY MOUTH EVERY DAY 12/19/23   Antoniette Vermell CROME, PA-C    Family History Family History  Problem Relation Age of Onset   Heart attack Father    Diabetes Father        grandmother   Cancer Father    Heart disease Father    Hyperlipidemia  Father    Hypertension Father    Cancer Mother    Alcoholism Other        parents   Prostate cancer Other        grandfather   Hyperlipidemia Other        grandfather   Breast cancer Sister     Social History Social History   Tobacco Use   Smoking status: Never   Smokeless tobacco: Never  Vaping Use   Vaping status: Never Used  Substance Use Topics   Alcohol use: No   Drug use: No     Allergies   Morphine and codeine, Farxiga  [dapagliflozin ], Glipizide , Hydrochlorothiazide , and Mounjaro  [tirzepatide ]   Review of Systems Review of Systems See HPI  Physical Exam Triage Vital Signs ED Triage Vitals  Encounter Vitals Group     BP 06/09/24 1357 127/76     Girls Systolic BP Percentile --      Girls Diastolic BP Percentile --      Boys Systolic BP Percentile --      Boys Diastolic BP Percentile --      Pulse Rate 06/09/24 1357 83     Resp 06/09/24 1357 16     Temp 06/09/24 1357 98.2 F (36.8 C)     Temp src --      SpO2 06/09/24 1357 93 %     Weight --      Height --      Head Circumference --      Peak Flow --      Pain Score 06/09/24 1359 2     Pain Loc --      Pain Education --      Exclude from Growth Chart --    No data found.  Updated Vital Signs BP 127/76   Pulse 83   Temp 98.2 F (36.8 C)   Resp 16   SpO2 93%      Physical Exam Constitutional:      General: She is not in acute distress.    Appearance: She is well-developed.  HENT:     Head: Normocephalic and atraumatic.      Comments: Entire right cheek is swollen.  Erythema and induration of 3 cm across.  With patient's permission I cleansed this with alcohol and opened the wound with an 18-gauge needle.  Purulence expressed Eyes:     Conjunctiva/sclera: Conjunctivae normal.     Pupils: Pupils are equal, round, and reactive to light.  Cardiovascular:     Rate and Rhythm: Normal rate.  Pulmonary:     Effort: Pulmonary effort is normal. No respiratory distress.  Abdominal:  General: There is no distension.     Palpations: Abdomen is soft.  Musculoskeletal:        General: Normal range of motion.     Cervical back: Normal range of motion.  Skin:    General: Skin is warm and dry.  Neurological:     Mental Status: She is alert.      UC Treatments / Results  Labs (all labs ordered are listed, but only abnormal results are displayed) Labs Reviewed - No data to display  EKG   Radiology No results found.  Procedures Procedures (including critical care time)  Medications Ordered in UC Medications - No data to display  Initial Impression / Assessment and Plan / UC Course  I have reviewed the triage vital signs and the nursing notes.  Pertinent labs & imaging results that were available during my care of the patient were reviewed by me and considered in my medical decision making (see chart for details).     Final Clinical Impressions(s) / UC Diagnoses   Final diagnoses:  Acute abscess of face     Discharge Instructions      Continue with warm compresses You may try to gently express cyst contents Take doxycycline  2 times a day with food See your doctor in follow-up   ED Prescriptions     Medication Sig Dispense Auth. Provider   doxycycline  (VIBRAMYCIN ) 100 MG capsule Take 1 capsule (100 mg total) by mouth 2 (two) times daily. 14 capsule Maranda Jamee Jacob, MD      PDMP not reviewed this encounter.   Maranda Jamee Jacob, MD 06/09/24 [phone]

## 2024-06-09 NOTE — Discharge Instructions (Signed)
 Continue with warm compresses You may try to gently express cyst contents Take doxycycline  2 times a day with food See your doctor in follow-up

## 2024-06-09 NOTE — ED Triage Notes (Addendum)
 Abscess to right side of lower face x 4 days. No fever. Has been using cream for rosacea from [employer].

## 2024-06-11 ENCOUNTER — Other Ambulatory Visit: Payer: Self-pay | Admitting: Physician Assistant

## 2024-06-16 ENCOUNTER — Encounter: Payer: Self-pay | Admitting: Physician Assistant

## 2024-06-16 ENCOUNTER — Ambulatory Visit (INDEPENDENT_AMBULATORY_CARE_PROVIDER_SITE_OTHER): Admitting: Physician Assistant

## 2024-06-16 VITALS — BP 122/73 | HR 70 | Ht 68.0 in | Wt 210.0 lb

## 2024-06-16 DIAGNOSIS — E1165 Type 2 diabetes mellitus with hyperglycemia: Secondary | ICD-10-CM

## 2024-06-16 DIAGNOSIS — R7989 Other specified abnormal findings of blood chemistry: Secondary | ICD-10-CM | POA: Diagnosis not present

## 2024-06-16 DIAGNOSIS — R809 Proteinuria, unspecified: Secondary | ICD-10-CM

## 2024-06-16 DIAGNOSIS — Z7984 Long term (current) use of oral hypoglycemic drugs: Secondary | ICD-10-CM

## 2024-06-16 DIAGNOSIS — I10 Essential (primary) hypertension: Secondary | ICD-10-CM

## 2024-06-16 LAB — POCT UA - MICROALBUMIN
Albumin/Creatinine Ratio, Urine, POC: >300
Creatinine, POC: 100 mg/dL
Microalbumin Ur, POC: 150 mg/L

## 2024-06-16 LAB — POCT GLYCOSYLATED HEMOGLOBIN (HGB A1C): HbA1c, POC (controlled diabetic range): 8.1 % — AB (ref 0.0–7.0)

## 2024-06-16 MED ORDER — VALSARTAN 320 MG PO TABS: 320.0000 mg | ORAL_TABLET | Freq: Every day | ORAL | 1 refills | Status: DC

## 2024-06-16 MED ORDER — BLOOD GLUCOSE TEST VI STRP
1.0000 | ORAL_STRIP | Freq: Three times a day (TID) | 0 refills | Status: AC
Start: 2024-06-16 — End: 2024-07-16

## 2024-06-16 MED ORDER — LANCET DEVICE MISC: 1.0000 | Freq: Three times a day (TID) | 0 refills | Status: AC

## 2024-06-16 MED ORDER — BLOOD GLUCOSE MONITORING SUPPL DEVI: 1.0000 | Freq: Three times a day (TID) | 0 refills | Status: DC

## 2024-06-16 MED ORDER — OZEMPIC (0.25 OR 0.5 MG/DOSE) 2 MG/3ML ~~LOC~~ SOPN: 0.2500 mg | PEN_INJECTOR | SUBCUTANEOUS | 2 refills | Status: DC

## 2024-06-16 MED ORDER — LANCETS MISC. MISC: 1.0000 | Freq: Three times a day (TID) | 0 refills | Status: AC

## 2024-06-16 NOTE — Progress Notes (Unsigned)
 Established Patient Office Visit  Subjective   Patient ID: Kayla Gallagher, female    DOB: 1951-03-31  Age: 73 y.o. MRN: [id number]  Chief Complaint  Patient presents with   Diabetes    HPI Pt is a 73 yo obese female with T2DM, HTN, HLD who presents to the clinic for 3 month follow up.   She is not checking her sugars. She is taking mounjaro  and actos  daily. She was not able to afford mounjaro  due to cost. She denies any CP, palpitations, headaches. She is not taking her BP at home. She admits to eating more fruits this summer. She is active but not exercising. Overall she feels well. Denies any numbness, tingling, vision changes.    Review of Systems  All other systems reviewed and are negative.     Objective:     BP 122/73 (BP Location: Left Arm, Patient Position: Sitting, Cuff Size: Large)   Pulse 70   Ht 5' 8 (1.727 m)   Wt 210 lb (95.3 kg)   SpO2 97%   BMI 31.93 kg/m  BP Readings from Last 3 Encounters:  06/16/24 122/73  06/09/24 127/76  05/30/24 135/84   Wt Readings from Last 3 Encounters:  06/16/24 210 lb (95.3 kg)  05/30/24 210 lb (95.3 kg)  03/10/24 210 lb (95.3 kg)      Physical Exam Constitutional:      Appearance: Normal appearance. She is obese.  HENT:     Head: Normocephalic.  Cardiovascular:     Rate and Rhythm: Normal rate and regular rhythm.     Pulses: Normal pulses.  Pulmonary:     Effort: Pulmonary effort is normal.     Breath sounds: Normal breath sounds.  Musculoskeletal:     Right lower leg: No edema.     Left lower leg: No edema.  Neurological:     General: No focal deficit present.     Mental Status: She is alert and oriented to person, place, and time.  Psychiatric:        Mood and Affect: Mood normal.      Results for orders placed or performed in visit on 06/16/24  POCT HgB A1C  Result Value Ref Range   Hemoglobin A1C     HbA1c POC (<> result, manual entry)     HbA1c, POC (prediabetic range)     HbA1c, POC  (controlled diabetic range) 8.1 (A) 0.0 - 7.0 %     The 10-year ASCVD risk score (Arnett DK, et al., 2019) is: 27.7%    Assessment & Plan:  Kayla Gallagher was seen today for diabetes.  Diagnoses and all orders for this visit:  Uncontrolled type 2 diabetes mellitus with hyperglycemia (HCC) -     POCT HgB A1C -     POCT UA - Microalbumin -     CMP14+EGFR -     Semaglutide ,0.25 or 0.5MG /DOS, (OZEMPIC , 0.25 OR 0.5 MG/DOSE,) 2 MG/3ML SOPN; Inject 0.25 mg into the skin once a week. -     Blood Glucose Monitoring Suppl DEVI; 1 each by Does not apply route in the morning, at noon, and at bedtime. May substitute to any manufacturer covered by patient's insurance. -     Glucose Blood (BLOOD GLUCOSE TEST STRIPS) STRP; 1 each by In Vitro route in the morning, at noon, and at bedtime. May substitute to any manufacturer covered by patient's insurance. -     Lancet Device MISC; 1 each by Does not apply route in the morning,  at noon, and at bedtime. May substitute to any manufacturer covered by patient's insurance. -     Lancets Misc. MISC; 1 each by Does not apply route in the morning, at noon, and at bedtime. May substitute to any manufacturer covered by patient's insurance.  Essential hypertension, benign -     valsartan  (DIOVAN ) 320 MG tablet; Take 1 tablet (320 mg total) by mouth daily.  Elevated TSH   A1c not to goal and increased over last 3 months Encouraged patient to start tracking her fasting sugars in the morning with goal under 130 Glucometer sent to pharmacy Discussed DM diet and regular exercise Start ozempic  .25mg  weekly and see if this is covered better by insurance Discussed side effects, will titrate very slow Let me know if not covered or not affordable BP to goal, refilled valsartan /norvasc /metoprolol  On statin, LDL to goal Pt declined covid booster and shingles vaccine Follow up in 3 months    Return in about 3 months (around 09/16/2024).    Meagen Limones, PA-C

## 2024-06-16 NOTE — Patient Instructions (Signed)

## 2024-06-17 ENCOUNTER — Ambulatory Visit: Payer: Self-pay | Admitting: Physician Assistant

## 2024-06-17 LAB — CMP14+EGFR
ALT: 17 IU/L (ref 0–32)
AST: 17 IU/L (ref 0–40)
Albumin: 4.2 g/dL (ref 3.8–4.8)
Alkaline Phosphatase: 81 IU/L (ref 44–121)
BUN/Creatinine Ratio: 17 (ref 12–28)
BUN: 15 mg/dL (ref 8–27)
Bilirubin Total: 0.3 mg/dL (ref 0.0–1.2)
CO2: 23 mmol/L (ref 20–29)
Calcium: 11 mg/dL — ABNORMAL HIGH (ref 8.7–10.3)
Chloride: 98 mmol/L (ref 96–106)
Creatinine, Ser: 0.88 mg/dL (ref 0.57–1.00)
Globulin, Total: 2.9 g/dL (ref 1.5–4.5)
Glucose: 244 mg/dL — ABNORMAL HIGH (ref 70–99)
Potassium: 4.8 mmol/L (ref 3.5–5.2)
Sodium: 134 mmol/L (ref 134–144)
Total Protein: 7.1 g/dL (ref 6.0–8.5)
eGFR: 69 mL/min/1.73 (ref >59)

## 2024-06-17 NOTE — Progress Notes (Signed)
 Kayla Gallagher,   Kidney and liver function good.  Glucose is higher than it has been being.  Calcium  is elevated. Are you taking vitamin D  make sure taking at least 2000 units daily.

## 2024-07-15 ENCOUNTER — Encounter: Payer: Self-pay | Admitting: Sports Medicine

## 2024-07-24 ENCOUNTER — Encounter

## 2024-08-02 ENCOUNTER — Other Ambulatory Visit: Payer: Self-pay | Admitting: Physician Assistant

## 2024-08-02 DIAGNOSIS — F410 Panic disorder [episodic paroxysmal anxiety] without agoraphobia: Secondary | ICD-10-CM

## 2024-08-16 ENCOUNTER — Other Ambulatory Visit: Payer: Self-pay | Admitting: Physician Assistant

## 2024-08-16 DIAGNOSIS — E1165 Type 2 diabetes mellitus with hyperglycemia: Secondary | ICD-10-CM

## 2024-09-03 ENCOUNTER — Other Ambulatory Visit: Payer: Self-pay | Admitting: Physician Assistant

## 2024-09-03 DIAGNOSIS — E1165 Type 2 diabetes mellitus with hyperglycemia: Secondary | ICD-10-CM

## 2024-09-16 ENCOUNTER — Encounter: Payer: Self-pay | Admitting: Physician Assistant

## 2024-09-16 ENCOUNTER — Ambulatory Visit: Admitting: Physician Assistant

## 2024-09-16 VITALS — BP 137/81 | HR 70 | Ht 68.0 in | Wt 206.0 lb

## 2024-09-16 DIAGNOSIS — F32A Depression, unspecified: Secondary | ICD-10-CM | POA: Diagnosis not present

## 2024-09-16 DIAGNOSIS — I10 Essential (primary) hypertension: Secondary | ICD-10-CM

## 2024-09-16 DIAGNOSIS — Z7984 Long term (current) use of oral hypoglycemic drugs: Secondary | ICD-10-CM

## 2024-09-16 DIAGNOSIS — Z1231 Encounter for screening mammogram for malignant neoplasm of breast: Secondary | ICD-10-CM | POA: Diagnosis not present

## 2024-09-16 DIAGNOSIS — E1165 Type 2 diabetes mellitus with hyperglycemia: Secondary | ICD-10-CM | POA: Diagnosis not present

## 2024-09-16 LAB — POCT GLYCOSYLATED HEMOGLOBIN (HGB A1C): Hemoglobin A1C: 7.7 % — AB (ref 4.0–5.6)

## 2024-09-16 MED ORDER — PAROXETINE HCL 40 MG PO TABS: 40.0000 mg | ORAL_TABLET | ORAL | 3 refills | Status: AC

## 2024-09-16 MED ORDER — AMLODIPINE BESYLATE 10 MG PO TABS: ORAL_TABLET | ORAL | 3 refills | Status: DC

## 2024-09-16 MED ORDER — METFORMIN HCL 1000 MG PO TABS: 1000.0000 mg | ORAL_TABLET | Freq: Two times a day (BID) | ORAL | 3 refills | Status: AC

## 2024-09-16 MED ORDER — METOPROLOL SUCCINATE ER 25 MG PO TB24: 25.0000 mg | ORAL_TABLET | Freq: Every day | ORAL | 3 refills | Status: AC

## 2024-09-16 MED ORDER — VALSARTAN 320 MG PO TABS: 320.0000 mg | ORAL_TABLET | Freq: Every day | ORAL | 1 refills | Status: AC

## 2024-09-16 MED ORDER — OZEMPIC (0.25 OR 0.5 MG/DOSE) 2 MG/3ML ~~LOC~~ SOPN: 0.2500 mg | PEN_INJECTOR | SUBCUTANEOUS | 0 refills | Status: DC

## 2024-09-16 MED ORDER — PIOGLITAZONE HCL 45 MG PO TABS: 45.0000 mg | ORAL_TABLET | Freq: Every day | ORAL | 3 refills | Status: AC

## 2024-09-17 NOTE — Progress Notes (Signed)
 Established Patient Office Visit  Subjective   Patient ID: Kayla Gallagher, female    DOB: 1950-12-11  Age: 73 y.o. MRN: [id number]  Chief Complaint  Patient presents with   Medical Management of Chronic Issues    HPI Patient is a 73 yo female with T2DM, HTN, MDD, GAD, HLD who presents to the clinic for medication follow up.   Pt is doing ok. She is not checking sugars. She is compliant with medications for the most part but has ran out of ozempic  and not had for the last few weeks. She does not exercise but very active. She denies any CP, palpitations, headaches or vision changes. She is not checking her BP at home.   She reports no problems with mood. Denies any SI/HC.     ROS See HPI.    Objective:     BP 137/81   Pulse 70   Ht 5' 8 (1.727 m)   Wt 206 lb (93.4 kg)   SpO2 99%   BMI 31.32 kg/m  BP Readings from Last 3 Encounters:  09/16/24 137/81  06/16/24 122/73  06/09/24 127/76   Wt Readings from Last 3 Encounters:  09/16/24 206 lb (93.4 kg)  06/16/24 210 lb (95.3 kg)  05/30/24 210 lb (95.3 kg)    .Kayla Gallagher    09/16/2024    2:03 PM 03/10/2024    3:26 PM 03/10/2024    1:35 PM 12/11/2023    1:52 PM 07/17/2023    1:59 PM  Depression screen PHQ 2/9  Decreased Interest 2 1 0 0 0  Down, Depressed, Hopeless 2 1 0 0 0  PHQ - 2 Score 4 2 0 0 0  Altered sleeping 2 1     Tired, decreased energy 2 1     Change in appetite 0 1     Feeling bad or failure about yourself  0 1     Trouble concentrating 0 1     Moving slowly or fidgety/restless 0 1     Suicidal thoughts 0 1     PHQ-9 Score 8  9      Difficult doing work/chores Not difficult at all Somewhat difficult        Data saved with a previous flowsheet row definition   .Kayla Gallagher    09/16/2024    2:03 PM 03/10/2024    3:27 PM 05/17/2022    2:35 PM 02/13/2022    1:44 PM  GAD 7 : Generalized Anxiety Score  Nervous, Anxious, on Edge 1 1 1 1   Control/stop worrying 0 1 1 0  Worry too much - different things 1 1 0 1  Trouble  relaxing 2 1 0 0  Restless 2 1 0 0  Easily annoyed or irritable 2 1 1 1   Afraid - awful might happen 0 1 0 0  Total GAD 7 Score 8 7 3 3   Anxiety Difficulty Not difficult at all Somewhat difficult Not difficult at all Not difficult at all      Physical Exam Constitutional:      Appearance: Normal appearance. She is obese.  HENT:     Head: Normocephalic.  Cardiovascular:     Rate and Rhythm: Normal rate and regular rhythm.     Heart sounds: Murmur heard.  Pulmonary:     Effort: Pulmonary effort is normal.     Breath sounds: Normal breath sounds.  Musculoskeletal:     Right lower leg: No edema.     Left lower leg: No  edema.  Neurological:     General: No focal deficit present.     Mental Status: She is alert and oriented to person, place, and time.  Psychiatric:        Mood and Affect: Mood normal.      Results for orders placed or performed in visit on 09/16/24  POCT HgB A1C  Result Value Ref Range   Hemoglobin A1C 7.7 (A) 4.0 - 5.6 %   HbA1c POC (<> result, manual entry)     HbA1c, POC (prediabetic range)     HbA1c, POC (controlled diabetic range)       The 10-year ASCVD risk score (Arnett DK, et al., 2019) is: 33.6%    Assessment & Plan:  SABRASABRALynnmarie was seen today for medical management of chronic issues.  Diagnoses and all orders for this visit:  Uncontrolled type 2 diabetes mellitus with hyperglycemia (HCC) -     POCT HgB A1C -     metFORMIN  (GLUCOPHAGE ) 1000 MG tablet; Take 1 tablet (1,000 mg total) by mouth 2 (two) times daily with a meal. -     pioglitazone  (ACTOS ) 45 MG tablet; Take 1 tablet (45 mg total) by mouth daily. -     Semaglutide ,0.25 or 0.5MG /DOS, (OZEMPIC , 0.25 OR 0.5 MG/DOSE,) 2 MG/3ML SOPN; Inject 0.25 mg into the skin once a week.  Depression, unspecified depression type -     PARoxetine  (PAXIL ) 40 MG tablet; Take 1 tablet (40 mg total) by mouth every morning. TAKE 1 TABLET BY MOUTH EVERY DAY IN THE MORNING  Essential hypertension,  benign -     amLODipine  (NORVASC ) 10 MG tablet; TAKE 1 TABLET BY MOUTH EVERY DAY -     metoprolol  succinate (TOPROL -XL) 25 MG 24 hr tablet; Take 1 tablet (25 mg total) by mouth daily. -     valsartan  (DIOVAN ) 320 MG tablet; Take 1 tablet (320 mg total) by mouth daily.  Visit for screening mammogram -     MM 3D SCREENING MAMMOGRAM BILATERAL BREAST   A1C not to goal but has decreased over the last 3 months She has not taken ozempic  for the last few weeks Restart all medications Discussed DM diet BP close to goal, continue on amlodipine , metoprolol , valsartan  On statin Needs eye exam  Foot exam UTD Mammogram and bone density ordered Declined covid and flu vaccine  PHQ/GAD not to goal but patient reports mood is stable Refilled paxil  Follow up as needed   Return in about 3 months (around 12/17/2024).    Manvir Thorson, PA-C

## 2024-09-22 ENCOUNTER — Encounter: Payer: Self-pay | Admitting: Physician Assistant

## 2024-10-08 ENCOUNTER — Encounter: Payer: Self-pay | Admitting: Family Medicine

## 2024-10-08 ENCOUNTER — Ambulatory Visit: Payer: Self-pay

## 2024-10-08 ENCOUNTER — Ambulatory Visit: Admitting: Family Medicine

## 2024-10-08 VITALS — BP 130/72 | HR 75 | Temp 97.8°F | Ht 68.0 in | Wt 211.0 lb

## 2024-10-08 DIAGNOSIS — R350 Frequency of micturition: Secondary | ICD-10-CM

## 2024-10-08 DIAGNOSIS — N3001 Acute cystitis with hematuria: Secondary | ICD-10-CM

## 2024-10-08 LAB — POCT URINALYSIS DIP (CLINITEK)
Bilirubin, UA: NEGATIVE
Glucose, UA: NEGATIVE mg/dL
Ketones, POC UA: NEGATIVE mg/dL
Nitrite, UA: NEGATIVE
POC PROTEIN,UA: 30 — AB
Spec Grav, UA: <=1.005 — AB (ref 1.010–1.025)
Urobilinogen, UA: 0.2 U/dL
pH, UA: 5.5 (ref 5.0–8.0)

## 2024-10-08 MED ORDER — CIPROFLOXACIN HCL 500 MG PO TABS: 500.0000 mg | ORAL_TABLET | Freq: Two times a day (BID) | ORAL | 0 refills | Status: AC

## 2024-10-08 NOTE — Progress Notes (Signed)
 Acute Office Visit  Subjective:     Patient ID: Kayla Gallagher, female    DOB: Apr 27, 1951, 73 y.o.   MRN: [id number]  Chief Complaint  Patient presents with   Urinary Frequency    HPI Patient is in today for urinary symptoms of frequency urgency without being able to empty bladder. Has not had infection in years.  Present for 2 months. Left low back pain which does not  hurt when walking.  Likely not related to kidney. No fever or chills. POC urine shows: trace blood and small leukocytes.    Review of Systems  Constitutional:  Negative for chills and fever.  Genitourinary:  Positive for dysuria, frequency and urgency.        Objective:    BP 130/72 (Patient Position: Sitting, Cuff Size: Large)   Pulse 75   Temp 97.8 F (36.6 C) (Oral)   Ht 5' 8 (1.727 m)   Wt 211 lb (95.7 kg)   SpO2 95%   BMI 32.08 kg/m    Physical Exam Vitals and nursing note reviewed.  Constitutional:      General: She is not in acute distress.    Appearance: Normal appearance.  Cardiovascular:     Rate and Rhythm: Normal rate and regular rhythm.     Heart sounds: Normal heart sounds.  Pulmonary:     Effort: Pulmonary effort is normal.     Breath sounds: Normal breath sounds.  Abdominal:     Tenderness: There is no right CVA tenderness or left CVA tenderness.  Skin:    General: Skin is warm and dry.  Neurological:     General: No focal deficit present.     Mental Status: She is alert. Mental status is at baseline.  Psychiatric:        Mood and Affect: Mood normal.        Behavior: Behavior normal.        Thought Content: Thought content normal.        Judgment: Judgment normal.     Results for orders placed or performed in visit on 10/08/24  POCT URINALYSIS DIP (CLINITEK)  Result Value Ref Range   Color, UA yellow yellow   Clarity, UA clear clear   Glucose, UA negative negative mg/dL   Bilirubin, UA negative negative   Ketones, POC UA negative negative mg/dL   Spec  Grav, UA <=8.994 (A) 1.010 - 1.025   Blood, UA trace-intact (A) negative   pH, UA 5.5 5.0 - 8.0   POC PROTEIN,UA =30 (A) negative, trace   Urobilinogen, UA 0.2 0.2 or 1.0 E.U./dL   Nitrite, UA Negative Negative   Leukocytes, UA Small (1+) (A) Negative        Assessment & Plan:   Problem List Items Addressed This Visit     Urinary frequency   Relevant Orders   POCT URINALYSIS DIP (CLINITEK) (Completed)   Acute cystitis with hematuria - Primary   Urinary frequency and urgency. Symptoms present for 2 months. Reports low back pain, does not hurt when walking. No CVA tenderness in office. No abdominal pain reported. Urine with trace blood and small leukocytes. Ciprofloxacin  500 mg BID for 5 days. Continue to increase water intake. Follow-up with PCP or this provider if symptoms do not resolve.         Relevant Medications   ciprofloxacin  (CIPRO ) 500 MG tablet    Meds ordered this encounter  Medications   ciprofloxacin  (CIPRO ) 500 MG tablet    Sig:  Take 1 tablet (500 mg total) by mouth 2 (two) times daily.    Dispense:  10 tablet    Refill:  0    Supervising Provider:   METHENEY, CATHERINE D [[id number]]  Agrees with plan of care discussed.  Questions answered.   Return if symptoms worsen or fail to improve.  Darice JONELLE Brownie, FNP

## 2024-10-08 NOTE — Telephone Encounter (Signed)
 FYI Only or Action Required?: FYI only for provider: appointment scheduled on 11/26, en route.  Patient was last seen in primary care on 09/16/2024 by Antoniette Vermell CROME, PA-C.  Called Nurse Triage reporting Urinary Frequency.  Symptoms began several months ago, happening more often now.  Interventions attempted: Rest, hydration, or home remedies.  Symptoms are: gradually worsening.  Triage Disposition: See HCP Within 4 Hours (Or PCP Triage)  Patient/caregiver understands and will follow disposition?: Yes  Copied from CRM #[id number]. Topic: Clinical - Red Word Triage >> Oct 08, 2024  3:39 PM Winona R wrote: UTI symptoms- urge to urinate, very little will come out, pain / discomfort, odor. Reason for Disposition  Diabetes mellitus or weak immune system (e.g., HIV positive, cancer chemo, splenectomy, organ transplant, chronic steroids)  Answer Assessment - Initial Assessment Questions 1. SYMPTOM: What's the main symptom you're concerned about? (e.g., frequency, incontinence)     Urge to urinate, but hardly anything comes out.  No change in color.  Takes B vitamins which colors urine.  Wears pads due to incontinence leak, noticing a different odor than normal.  When doesn't pee very much, it hurts.   2. ONSET: When did the  symptoms  start?     Couple months, comes and goes, happening more often now.  Not worsening today but would like to have checked out.   3. PAIN: Is there any pain? If Yes, ask: How bad is it? (Scale: 1-10; mild, moderate, severe)     5/10 pain level when urinating, does not continue most of the time.  Denies pain when not urinating. Burning pain, pins and needles.  Urethra area.   4. CAUSE: What do you think is causing the symptoms?     Not my first rodeo with UTIs. Usually takes antibiotics and drinks a lot of cranberry juice.   5. OTHER SYMPTOMS: Do you have any other symptoms? (e.g., blood in urine, fever, flank pain, pain with urination)     Drinking  a good amount of water but still not urinating.  Has not noticed any fullness in bladder after urination.  Denies blood in urine.  Does get pain in right lower back, not sure what this is related to.   Does not relate to painful urination.  Denies history of kidney stones but has had kidney infections. Denies any recent fever or chills.  Answer Assessment - Initial Assessment Questions FEVER: Do you have a fever? If Yes, ask: What is your temperature, how was it measured, and when did it start?     Denies  PAST UTI: Have you had a urine infection before? If Yes, ask: When was the last time? and What happened that time?      Yes, typically takes antibiotic and drinks cranberry juice.  CAUSE: What do you think is causing the painful urination?  (e.g., UTI, scratch, Herpes sore)     UTI  OTHER SYMPTOMS: Do you have any other symptoms? (e.g., blood in urine, flank pain, genital sores, urgency, vaginal discharge)     Nausea at bedtime, denies vomiting.    Answer Assessment - Initial Assessment Questions 1. SYMPTOM: What's the main symptom you're concerned about? (e.g., frequency, incontinence)     Urge to urinate, but hardly anything comes out.  No change in color.  Takes B vitamins which colors urine.  Wears pads due to incontinence leak, noticing a different odor than normal.  When doesn't pee very much, it hurts.   2. ONSET: When did the  symptoms  start?     Couple months, comes and goes, happening more often now.  Symptoms not worsening today aside from happening more often, would like to have checked out.   3. PAIN: Is there any pain? If Yes, ask: How bad is it? (Scale: 1-10; mild, moderate, severe)     5/10 pain level when urinating, does not continue most of the time.  Denies pain when not urinating. Describes as a burning pain, pins and needles to urethra.  4. CAUSE: What do you think is causing the symptoms?     Not my first rodeo with UTIs. Usually takes  antibiotics and drinks a lot of cranberry juice.   5. OTHER SYMPTOMS: Do you have any other symptoms? (e.g., blood in urine, fever, flank pain, pain with urination)     Drinking a good amount of water but still not urinating.  Has not noticed any fullness in bladder after urination.  Denies blood in urine.  Does get pain in right lower back, not sure what this is related to.   Does not relate to painful urination.  Denies history of kidney stones but has had kidney infections. Denies any recent fever or chills.  Protocols used: Urinary Symptoms-A-AH  Protocols used: Urinary Symptoms-A-AH, Urination Pain - Female-A-AH

## 2024-10-08 NOTE — Assessment & Plan Note (Signed)
 Urinary frequency and urgency. Symptoms present for 2 months. Reports low back pain, does not hurt when walking. No CVA tenderness in office. No abdominal pain reported. Urine with trace blood and small leukocytes. Ciprofloxacin  500 mg BID for 5 days. Continue to increase water intake. Follow-up with PCP or this provider if symptoms do not resolve.

## 2024-10-30 ENCOUNTER — Ambulatory Visit: Payer: Self-pay

## 2024-10-30 NOTE — Telephone Encounter (Signed)
 Patient shows scheduled for 12/19/225 with Joy Jessup,NP

## 2024-10-30 NOTE — Telephone Encounter (Signed)
 Copied from CRM #[id number]. Topic: Appointments - Appointment Scheduling >> Oct 30, 2024  1:28 PM Victoria B wrote: Patient called says still UTI symptoms, seems to be getting worse, frequent urination and can't make it to the bathroom before urine comes out.  Phone call placed to patient. No answer-voicemail left for patient to call back to nurse triage. Will place in call backs.

## 2024-10-30 NOTE — Telephone Encounter (Addendum)
 Called pt, 2nd  attempt, message left

## 2024-10-30 NOTE — Telephone Encounter (Signed)
 FYI Only or Action Required?: FYI only for provider: appointment scheduled on 10/31/2024 at 3:20 PM.  Patient was last seen in primary care on 10/08/2024 by Kayla Darice SAUNDERS, FNP.  Called Nurse Triage reporting Urinary Frequency.  Symptoms began several weeks ago.  Interventions attempted: Rest, hydration, or home remedies.  Symptoms are: unchanged.  Triage Disposition: See Physician Within 24 Hours  Patient/caregiver understands and will follow disposition?: Yes  Reason for Disposition  Urinating more frequently than usual (i.e., frequency) OR new-onset of the feeling of an urgent need to urinate (i.e., urgency)  Answer Assessment - Initial Assessment Questions Patient concerned for possible UTI.  1. SYMPTOM: What's the main symptom you're concerned about? (e.g., frequency, incontinence)     Urinary frequency 2. ONSET: When did the  urinary frequency  start?     Since November 3. PAIN: Is there any pain? If Yes, ask: How bad is it? (Scale: 1-10; mild, moderate, severe)     Yes-back pain 4-5 out of 10 4. CAUSE: What do you think is causing the symptoms?     Concerned for possible UTI 5. OTHER SYMPTOMS: Do you have any other symptoms? (e.g., blood in urine, fever, flank pain, pain with urination)     urgency  Protocols used: Urinary Symptoms-A-AH

## 2024-10-31 ENCOUNTER — Ambulatory Visit: Admitting: Medical-Surgical

## 2024-10-31 VITALS — BP 127/77 | HR 72 | Temp 98.0°F | Ht 68.0 in | Wt 211.5 lb

## 2024-10-31 DIAGNOSIS — R3915 Urgency of urination: Secondary | ICD-10-CM | POA: Diagnosis not present

## 2024-10-31 DIAGNOSIS — M5441 Lumbago with sciatica, right side: Secondary | ICD-10-CM | POA: Diagnosis not present

## 2024-10-31 DIAGNOSIS — N952 Postmenopausal atrophic vaginitis: Secondary | ICD-10-CM | POA: Diagnosis not present

## 2024-10-31 DIAGNOSIS — G8929 Other chronic pain: Secondary | ICD-10-CM | POA: Diagnosis not present

## 2024-10-31 DIAGNOSIS — R809 Proteinuria, unspecified: Secondary | ICD-10-CM | POA: Diagnosis not present

## 2024-10-31 LAB — POCT URINALYSIS DIP (CLINITEK)
Bilirubin, UA: NEGATIVE
Blood, UA: NEGATIVE
Glucose, UA: NEGATIVE mg/dL
Leukocytes, UA: NEGATIVE
Nitrite, UA: NEGATIVE
POC PROTEIN,UA: >=300 — AB
Spec Grav, UA: 1.025
Urobilinogen, UA: 1 U/dL
pH, UA: 6

## 2024-10-31 MED ORDER — ESTRADIOL 0.01 % VA CREA: TOPICAL_CREAM | VAGINAL | 12 refills | Status: AC

## 2024-10-31 NOTE — Progress Notes (Unsigned)
 "  Acute Office Visit  Subjective:     Patient ID: Kayla Gallagher, female    DOB: 1951-09-18, 73 y.o.   MRN: [id number]  Chief Complaint  Patient presents with   possible UTI    Patient c/o back pain x few weeks, urgency , what feels like a muscle spasm at end of urination and incontinent of urine. Was positive for UTI  one month ago at [facility]. Patient states has ongoing protienuria although nothing has been done regarding this.     HPI  73 year old female presents for urinary frequency and lower back pian  Urinary Frequency Patient is in today for intermittent back pain, urinary urgency, and incontinence 2-3 weeks ago. Patient was recently treated for UTI at the end of November. She states that symptoms completely resolved. Frequent trips to the bathroom at least once per hour and at least 3 incontinence episodes per day. She describes the discomfort like a spasm. Denies fever or chills.  Back Pain Right sided lower back pain that has been intermittent for a couple of years. She has related the back pain to musculoskeletal pain. Pain radiates down her right hip. Does not feel like the same pain that accompanied her UTI. Back pain exacerbated by strenuous activity. Denies fever or chills  Review of Systems  Constitutional: Negative.   HENT: Negative.    Eyes: Negative.   Respiratory: Negative.    Cardiovascular: Negative.   Gastrointestinal: Negative.   Genitourinary:  Positive for frequency and urgency.  Musculoskeletal:  Positive for back pain.  Skin: Negative.   Neurological: Negative.   Endo/Heme/Allergies: Negative.   Psychiatric/Behavioral: Negative.          Objective:    BP 127/77   Pulse 72   Temp 98 F (36.7 C)   Ht 5' 8 (1.727 m)   Wt 95.9 kg   SpO2 97%   BMI 32.16 kg/m  BP Readings from Last 3 Encounters:  10/31/24 127/77  10/08/24 130/72  09/16/24 137/81      Physical Exam Vitals and nursing note reviewed.  Constitutional:       General: She is not in acute distress.    Appearance: Normal appearance.  Cardiovascular:     Rate and Rhythm: Normal rate and regular rhythm.     Pulses: Normal pulses.     Heart sounds: Normal heart sounds.  Pulmonary:     Effort: Pulmonary effort is normal.     Breath sounds: Normal breath sounds.  Abdominal:     Tenderness: There is no abdominal tenderness. There is no right CVA tenderness or left CVA tenderness.  Musculoskeletal:       Back:     Comments: Right lower sided back pain  Neurological:     General: No focal deficit present.     Mental Status: She is alert and oriented to person, place, and time.  Psychiatric:        Mood and Affect: Mood normal.        Behavior: Behavior normal.        Thought Content: Thought content normal.        Judgment: Judgment normal.     Results for orders placed or performed in visit on 10/31/24  POCT URINALYSIS DIP (CLINITEK)  Result Value Ref Range   Color, UA yellow yellow   Clarity, UA clear clear   Glucose, UA negative negative mg/dL   Bilirubin, UA negative negative   Ketones, POC UA trace (5) (A)  negative mg/dL   Spec Grav, UA 8.974 8.989 - 1.025   Blood, UA negative negative   pH, UA 6.0 5.0 - 8.0   POC PROTEIN,UA >=300 (A) negative, trace   Urobilinogen, UA 1.0 0.2 or 1.0 E.U./dL   Nitrite, UA Negative Negative   Leukocytes, UA Negative Negative        Assessment & Plan:  1. Urinary urgency (Primary) -POCT UA + protein and ketones -Consider starting on Myrbetric or Oxybutynin in the future if symptoms persist  - POCT URINALYSIS DIP (CLINITEK)  2. Chronic right-sided low back pain with right-sided sciatica -Discussed options for pain management- patient declined medications -Stretches and exercises provided to help with pain relief -Recommended the use of Tiger balm for pain management  3. Proteinuria, unspecified type -24 hour urine ordered due to ongoing history of proteinuria - Protein, urine, 24 hour    4. Vaginal atrophy -Recommended applying estrace cream to the vaginal and urethra area nightly for 14 nights -Consider starting on Myrbetric or Oxybutynin in the future if symptoms persist    Meds ordered this encounter  Medications   estradiol (ESTRACE) 0.01 % CREA vaginal cream    Sig: Using a clean fingertip, apply a pea sized amount to the external vaginal area and urethra nightly for 14 days then reduce to three times weekly.    Dispense:  42.5 g    Refill:  12    Supervising Provider:   MATTHEWS, CODY [[id number]]    Return if symptoms worsen or fail to improve.  Derrek JINNY Freund, NP Student   "

## 2024-11-01 NOTE — Progress Notes (Signed)
 Medical screening examination/treatment was performed by qualified clinical staff member and as supervising provider I was immediately available for consultation/collaboration. I have reviewed documentation and agree with assessment and plan.  Thayer Ohm, DNP, APRN, FNP-BC [facility]

## 2024-11-02 LAB — URINE CULTURE

## 2024-11-03 ENCOUNTER — Ambulatory Visit: Payer: Self-pay | Admitting: Medical-Surgical

## 2024-11-03 DIAGNOSIS — R809 Proteinuria, unspecified: Secondary | ICD-10-CM

## 2024-11-04 LAB — PROTEIN, URINE, 24 HOUR
Protein, 24H Urine: 554 mg/(24.h) — ABNORMAL HIGH (ref 30–150)
Protein, Ur: 50.4 mg/dL

## 2024-11-07 ENCOUNTER — Other Ambulatory Visit

## 2024-11-07 DIAGNOSIS — R809 Proteinuria, unspecified: Secondary | ICD-10-CM | POA: Diagnosis not present

## 2024-11-17 ENCOUNTER — Other Ambulatory Visit: Payer: Self-pay | Admitting: Physician Assistant

## 2024-11-17 DIAGNOSIS — N951 Menopausal and female climacteric states: Secondary | ICD-10-CM

## 2024-11-18 ENCOUNTER — Telehealth: Payer: Self-pay

## 2024-11-18 DIAGNOSIS — R809 Proteinuria, unspecified: Secondary | ICD-10-CM

## 2024-11-18 NOTE — Telephone Encounter (Signed)
 Kayla Mini, NP  [facility] Please contact patient: In order to get the referral to nephrology pushed through and get her scheduled, she will need to have blood work for a CMP done. She is welcome to come by the office or any [facility] location to have that done. Thanks, Joy  Contacted the patient and she will come by the office at her convenience to have this lab work drawn. CMP orders placed in patient chart.

## 2024-11-19 ENCOUNTER — Ambulatory Visit: Payer: Self-pay | Admitting: Physician Assistant

## 2024-11-19 DIAGNOSIS — N951 Menopausal and female climacteric states: Secondary | ICD-10-CM

## 2024-11-19 LAB — CMP14+EGFR
ALT: 15 IU/L (ref 0–32)
AST: 16 IU/L (ref 0–40)
Albumin: 4.3 g/dL (ref 3.8–4.8)
Alkaline Phosphatase: 69 IU/L (ref 49–135)
BUN/Creatinine Ratio: 20 (ref 12–28)
BUN: 18 mg/dL (ref 8–27)
Bilirubin Total: 0.3 mg/dL (ref 0.0–1.2)
CO2: 23 mmol/L (ref 20–29)
Calcium: 10.8 mg/dL — ABNORMAL HIGH (ref 8.7–10.3)
Chloride: 100 mmol/L (ref 96–106)
Creatinine, Ser: 0.88 mg/dL (ref 0.57–1.00)
Globulin, Total: 3.1 g/dL (ref 1.5–4.5)
Glucose: 133 mg/dL — ABNORMAL HIGH (ref 70–99)
Potassium: 4.8 mmol/L (ref 3.5–5.2)
Sodium: 138 mmol/L (ref 134–144)
Total Protein: 7.4 g/dL (ref 6.0–8.5)
eGFR: 69 mL/min/1.73

## 2024-11-19 NOTE — Progress Notes (Signed)
 Jarelyn,   Sugar randomly better than has been.  Calcium  is almost normal. Don't take any extra calcium . Stay on vitamin D .

## 2024-11-21 ENCOUNTER — Telehealth: Payer: Self-pay | Admitting: Physician Assistant

## 2024-11-21 ENCOUNTER — Ambulatory Visit: Payer: Self-pay | Admitting: Medical-Surgical

## 2024-11-21 DIAGNOSIS — N951 Menopausal and female climacteric states: Secondary | ICD-10-CM

## 2024-11-21 MED ORDER — EST ESTROGENS-METHYLTEST 1.25-2.5 MG PO TABS: 1.0000 | ORAL_TABLET | Freq: Every day | ORAL | 5 refills | Status: AC

## 2024-11-21 NOTE — Telephone Encounter (Signed)
 Estratest  1.25-2.5 rx rf requested  Last written 04/25/2024  Last OV 10/31/2024 Upcoming appt 12/19/2024

## 2024-11-21 NOTE — Telephone Encounter (Signed)
 Copied from CRM #[id number]. Topic: Clinical - Prescription Issue >> Nov 21, 2024  1:57 PM Mercer PEDLAR wrote: Reason for CRM: Cathleen from [facility] is requesting estrogens -methylTEST (ESTRATEST) 1.25-2.5 MG tablet to be resent to pharmacy.    [id number] - [place], [place] - [address], [place] [address] Phone: [phone]  Fax: [phone]

## 2024-11-21 NOTE — Addendum Note (Signed)
 Addended by: ANTONIETTE VERMELL CROME on: 11/21/2024 03:14 PM   Modules accepted: Orders

## 2024-11-21 NOTE — Telephone Encounter (Signed)
 Just to be sure-  Should patient be on both Estrogen and Estrotest? Estrogen was refilled n 10/31/2024 and Estrotest today .

## 2024-11-21 NOTE — Telephone Encounter (Signed)
 Yes estrogen was a cream and the other is tablet form.

## 2024-11-26 ENCOUNTER — Telehealth: Admitting: Family Medicine

## 2024-11-26 DIAGNOSIS — L719 Rosacea, unspecified: Secondary | ICD-10-CM

## 2024-11-26 MED ORDER — DOXYCYCLINE HYCLATE 100 MG PO TABS: 100.0000 mg | ORAL_TABLET | Freq: Every day | ORAL | 0 refills | Status: AC

## 2024-11-26 NOTE — Progress Notes (Signed)
 E visit for Rosacea We are sorry that you are not feeling well. Here is how we plan to help! Based on what you shared with me it looks like you have Rosacea.  Rosacea is a common chronic skin condition that usually only affects the face and eyes.  Occasionally, the neck, chest, or other areas may be involved.  Characterized by redness, pimples, and broken blood vessels, rosacea tends to begin after middle age (between the ages of 46 and 7).  It is more common in fair-skinned people and women in menopause. It may appear differently in dark skinned people but rosacea effects all ethnic groups.  The cause of rosacea is not fully understood. We do know that rosacea is worsened by various trigger factors including, spicy or hot foods, hot beverages such as coffee or tea, alcohol, and sun exposure just to name a few.  Signs of rosacea may vary greatly from person to person. In some individuals it may only flare up from time to time.   I have prescribed: An oral antibiotic that may lessen the redness of your skin called doxycycline  100mg  daily.   HOME CARE: Keep a record of triggers, such as stress, weather, or certain foods or drinks. Consider limiting hot or spicy foods and alcohol. Always use sunscreen that protects against UVA and UVB rays and has a sun-protecting factor (SPF) of 15 or higher. Avoid putting steroids on the skin sores. Steroids may make rosacea worse.  You may use small amounts of water based cosmetic while using this medication.  Apply cosmetics after cream has dried. If you shave your face, use an electric razor Dont scrub your skin or use sponges, brushes, or other abrasive tools. Doing so can irritate your skin. GET HELP RIGHT AWAY IF: If your rosacea gets worse or is not better within 4 weeks. If a new skin condition or rash develops. Loss of feeling or tingling of treated area Nausea  MAKE SURE YOU   Understand these instructions. Will watch your condition. Will get  help right away if you are not doing well or get worse.  Your e-visit answers were reviewed by a board certified advanced clinical practitioner to complete your personal care plan. Depending upon the condition, your plan could have included both over the counter or prescription medications. Please review your pharmacy choice. Be sure that the pharmacy you have chosen is open so that you can pick up your prescription now.  If there is a problem you may message your provider in MyChart to have the prescription routed to another pharmacy. Your safety is important to us . If you have drug allergies check your prescription carefully.  For the next 24 hours, you can use MyChart to ask questions about todays visit, request a non-urgent call back, or ask for a work or school excuse from your e-visit provider. You will get an email in the next two days asking about your experience. I hope that your e-visit has been valuable and will speed your recovery.  I have spent 5 minutes in review of e-visit questionnaire, review and updating patient chart, medical decision making and response to patient.   Kristine Chahal, FNP

## 2024-12-07 ENCOUNTER — Other Ambulatory Visit: Payer: Self-pay | Admitting: Physician Assistant

## 2024-12-07 DIAGNOSIS — I10 Essential (primary) hypertension: Secondary | ICD-10-CM

## 2024-12-19 ENCOUNTER — Encounter: Payer: Self-pay | Admitting: Physician Assistant

## 2024-12-19 ENCOUNTER — Ambulatory Visit: Admitting: Physician Assistant

## 2024-12-19 VITALS — BP 138/80 | HR 80

## 2024-12-19 DIAGNOSIS — E1165 Type 2 diabetes mellitus with hyperglycemia: Secondary | ICD-10-CM

## 2024-12-19 LAB — POCT UA - MICROALBUMIN
Creatinine, POC: 300 mg/dL
Microalbumin Ur, POC: 150 mg/L

## 2024-12-19 LAB — POCT GLYCOSYLATED HEMOGLOBIN (HGB A1C): Hemoglobin A1C: 7.7 % — AB (ref 4.0–5.6)

## 2024-12-19 MED ORDER — SEMAGLUTIDE (1 MG/DOSE) 4 MG/3ML ~~LOC~~ SOPN: 1.0000 mg | PEN_INJECTOR | SUBCUTANEOUS | 2 refills | Status: AC

## 2025-03-20 ENCOUNTER — Ambulatory Visit: Admitting: Physician Assistant
